# Patient Record
Sex: Male | Born: 1980
Health system: Southern US, Community
[De-identification: ages and names within clinical notes are randomized; demographics above are authoritative.]

## PROBLEM LIST (undated history)

## (undated) DIAGNOSIS — I10 Essential (primary) hypertension: Secondary | ICD-10-CM

## (undated) HISTORY — PX: DENTAL SURGERY: SHX609

## (undated) HISTORY — DX: Essential (primary) hypertension: I10

## (undated) HISTORY — PX: WISDOM TOOTH EXTRACTION: SHX21

---

## 2003-04-17 ENCOUNTER — Emergency Department (HOSPITAL_COMMUNITY): Admission: EM | Admit: 2003-04-17 | Discharge: 2003-04-17 | Payer: Self-pay | Admitting: Emergency Medicine

## 2006-06-17 ENCOUNTER — Emergency Department (HOSPITAL_COMMUNITY): Admission: EM | Admit: 2006-06-17 | Discharge: 2006-06-17 | Payer: Self-pay | Admitting: Emergency Medicine

## 2011-01-24 ENCOUNTER — Ambulatory Visit (INDEPENDENT_AMBULATORY_CARE_PROVIDER_SITE_OTHER): Payer: Commercial Managed Care - PPO

## 2011-01-24 DIAGNOSIS — I10 Essential (primary) hypertension: Secondary | ICD-10-CM

## 2011-01-24 DIAGNOSIS — M25519 Pain in unspecified shoulder: Secondary | ICD-10-CM

## 2011-04-30 ENCOUNTER — Other Ambulatory Visit: Payer: Self-pay | Admitting: Family Medicine

## 2011-04-30 MED ORDER — AMLODIPINE BESYLATE 5 MG PO TABS: 5.0000 mg | ORAL_TABLET | Freq: Every day | ORAL | Status: DC

## 2011-06-11 DIAGNOSIS — Z0271 Encounter for disability determination: Secondary | ICD-10-CM

## 2011-06-14 ENCOUNTER — Other Ambulatory Visit: Payer: Self-pay | Admitting: Family Medicine

## 2011-06-25 ENCOUNTER — Ambulatory Visit (INDEPENDENT_AMBULATORY_CARE_PROVIDER_SITE_OTHER): Payer: 59 | Admitting: Family Medicine

## 2011-06-25 VITALS — BP 146/99 | HR 66 | Temp 98.4°F | Resp 18 | Ht 71.0 in | Wt 193.0 lb

## 2011-06-25 DIAGNOSIS — I10 Essential (primary) hypertension: Secondary | ICD-10-CM

## 2011-06-25 DIAGNOSIS — L02219 Cutaneous abscess of trunk, unspecified: Secondary | ICD-10-CM

## 2011-06-25 DIAGNOSIS — L02214 Cutaneous abscess of groin: Secondary | ICD-10-CM

## 2011-06-25 DIAGNOSIS — L03319 Cellulitis of trunk, unspecified: Secondary | ICD-10-CM

## 2011-06-25 MED ORDER — AMLODIPINE BESYLATE 10 MG PO TABS: 10.0000 mg | ORAL_TABLET | Freq: Every day | ORAL | Status: DC

## 2011-06-25 MED ORDER — DOXYCYCLINE HYCLATE 100 MG PO TABS: 100.0000 mg | ORAL_TABLET | Freq: Two times a day (BID) | ORAL | Status: DC

## 2011-06-25 NOTE — Progress Notes (Signed)
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 He is when she's off.  He comes in today with 2 problems: Elevated blood pressure and boil in the groin. He needs a refill on his blood pressure medicine and he recognizes that he has not been eating right lately with increased salt in his diet. He is being compliant with his medication however. He's having no headaches, chest pain, shortness of breath.  Patient had a persistent boil in his left groin that drained yellow pus periodically. He has a new one on the right perineum as well. These are mildly tender.  Today he had a tooth extracted and plans on getting more dental work that he has insurance and force.  Objective: HEENT recent extraction of tooth, otherwise negative  Chest: Clear  Heart: Regular no murmur Aref  Groin: 1 cm left draining boil on the side of the scrotum, 1/2 cm scrotal early abscess on the right.  Assessment: Hypertension, uncontrolled; boils in groin  Plan: Increase amlodipine to 10 mg daily, recheck in one week  Doxycycline 100 twice a day x10 days

## 2011-07-02 ENCOUNTER — Ambulatory Visit (INDEPENDENT_AMBULATORY_CARE_PROVIDER_SITE_OTHER): Payer: 59 | Admitting: Family Medicine

## 2011-07-02 VITALS — BP 132/80 | HR 71 | Temp 98.3°F | Resp 16 | Ht 72.5 in | Wt 192.6 lb

## 2011-07-02 DIAGNOSIS — N4822 Cellulitis of corpus cavernosum and penis: Secondary | ICD-10-CM

## 2011-07-02 DIAGNOSIS — I10 Essential (primary) hypertension: Secondary | ICD-10-CM | POA: Insufficient documentation

## 2011-07-02 DIAGNOSIS — N4829 Other inflammatory disorders of penis: Secondary | ICD-10-CM

## 2011-07-02 NOTE — Patient Instructions (Signed)
Please return in 1 month   Hypertension As your heart beats, it forces blood through your arteries. This force is your blood pressure. If the pressure is too high, it is called hypertension (HTN) or high blood pressure. HTN is dangerous because you may have it and not know it. High blood pressure may mean that your heart has to work harder to pump blood. Your arteries may be narrow or stiff. The extra work puts you at risk for heart disease, stroke, and other problems.  Blood pressure consists of two numbers, a higher number over a lower, 110/72, for example. It is stated as "110 over 72." The ideal is below 120 for the top number (systolic) and under 80 for the bottom (diastolic). Write down your blood pressure today. You should pay close attention to your blood pressure if you have certain conditions such as:  Heart failure.   Prior heart attack.   Diabetes   Chronic kidney disease.   Prior stroke.   Multiple risk factors for heart disease.  To see if you have HTN, your blood pressure should be measured while you are seated with your arm held at the level of the heart. It should be measured at least twice. A one-time elevated blood pressure reading (especially in the Emergency Department) does not mean that you need treatment. There may be conditions in which the blood pressure is different between your right and left arms. It is important to see your caregiver soon for a recheck. Most people have essential hypertension which means that there is not a specific cause. This type of high blood pressure may be lowered by changing lifestyle factors such as:  Stress.   Smoking.   Lack of exercise.   Excessive weight.   Drug/tobacco/alcohol use.   Eating less salt.  Most people do not have symptoms from high blood pressure until it has caused damage to the body. Effective treatment can often prevent, delay or reduce that damage. TREATMENT  When a cause has been identified, treatment for  high blood pressure is directed at the cause. There are a large number of medications to treat HTN. These fall into several categories, and your caregiver will help you select the medicines that are best for you. Medications may have side effects. You should review side effects with your caregiver. If your blood pressure stays high after you have made lifestyle changes or started on medicines,   Your medication(s) may need to be changed.   Other problems may need to be addressed.   Be certain you understand your prescriptions, and know how and when to take your medicine.   Be sure to follow up with your caregiver within the time frame advised (usually within two weeks) to have your blood pressure rechecked and to review your medications.   If you are taking more than one medicine to lower your blood pressure, make sure you know how and at what times they should be taken. Taking two medicines at the same time can result in blood pressure that is too low.  SEEK IMMEDIATE MEDICAL CARE IF:  You develop a severe headache, blurred or changing vision, or confusion.   You have unusual weakness or numbness, or a faint feeling.   You have severe chest or abdominal pain, vomiting, or breathing problems.  MAKE SURE YOU:   Understand these instructions.   Will watch your condition.   Will get help right away if you are not doing well or get worse.  Document  Released: 01/27/2005 Document Revised: 01/16/2011 Document Reviewed: 09/17/2007 [facility] Patient Information 2012 [place], [place].

## 2011-07-02 NOTE — Progress Notes (Signed)
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  He comes in today with 2 problems: Elevated blood pressure and boil in the groin.  He is being compliant with his medication however. He's having no headaches, chest pain, shortness of breath. He feels a little bit more tired on the new medicines. Patient had a persistent boil in his left penis that drained yellow pus periodically. This is getting smaller and almost stopped drinking altogether.   Objective: HEENT recent extraction of tooth, otherwise negative  Blood pressure: 140/90 Chest: Clear  Heart: Regular no murmur Aref  Groin: 1 cm left draining boil on the side of the penis,  Assessment: Hypertension, uncontrolled; boils in groin  Plan:  amlodipine to 10 mg daily, recheck in one month Doxycycline 100 twice a day to be finished

## 2011-07-03 NOTE — Progress Notes (Signed)
Follow up appt scheduled with pt for June 27th at noon.

## 2011-07-07 ENCOUNTER — Ambulatory Visit (INDEPENDENT_AMBULATORY_CARE_PROVIDER_SITE_OTHER): Payer: 59 | Admitting: Family Medicine

## 2011-07-07 VITALS — BP 160/114 | HR 110 | Temp 98.0°F | Resp 16 | Ht 72.25 in | Wt 187.6 lb

## 2011-07-07 DIAGNOSIS — IMO0002 Reserved for concepts with insufficient information to code with codable children: Secondary | ICD-10-CM

## 2011-07-07 DIAGNOSIS — T07XXXA Unspecified multiple injuries, initial encounter: Secondary | ICD-10-CM

## 2011-07-07 DIAGNOSIS — Z23 Encounter for immunization: Secondary | ICD-10-CM

## 2011-07-07 MED ORDER — HYDROCODONE-ACETAMINOPHEN 5-500 MG PO TABS: 1.0000 | ORAL_TABLET | Freq: Three times a day (TID) | ORAL | Status: DC | PRN

## 2011-07-07 MED ORDER — SILVER SULFADIAZINE 1 % EX CREA: TOPICAL_CREAM | Freq: Every day | CUTANEOUS | Status: DC

## 2011-07-07 MED ORDER — TETANUS-DIPHTH-ACELL PERTUSSIS 5-2.5-18.5 LF-MCG/0.5 IM SUSP
0.5000 mL | Freq: Once | INTRAMUSCULAR | Status: AC
  Administered 2011-07-07: 0.5 mL via INTRAMUSCULAR
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
## 2011-07-12 ENCOUNTER — Ambulatory Visit (INDEPENDENT_AMBULATORY_CARE_PROVIDER_SITE_OTHER): Payer: 59 | Admitting: Family Medicine

## 2011-07-12 VITALS — BP 133/93 | HR 106 | Temp 99.3°F | Resp 16 | Ht 72.0 in | Wt 187.2 lb

## 2011-07-12 DIAGNOSIS — M25562 Pain in left knee: Secondary | ICD-10-CM

## 2011-07-12 DIAGNOSIS — M25569 Pain in unspecified knee: Secondary | ICD-10-CM

## 2011-07-12 DIAGNOSIS — T07XXXA Unspecified multiple injuries, initial encounter: Secondary | ICD-10-CM

## 2011-07-12 MED ORDER — HYDROCODONE-ACETAMINOPHEN 5-500 MG PO TABS: 1.0000 | ORAL_TABLET | Freq: Three times a day (TID) | ORAL | Status: DC | PRN

## 2011-07-12 NOTE — Patient Instructions (Addendum)
Continue the silvadene cream dressings.  Recheck Friday.
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
## 2011-07-18 ENCOUNTER — Ambulatory Visit (INDEPENDENT_AMBULATORY_CARE_PROVIDER_SITE_OTHER): Payer: 59 | Admitting: Family Medicine

## 2011-07-18 VITALS — BP 131/81 | HR 65 | Temp 98.7°F | Resp 18 | Ht 72.0 in | Wt 186.0 lb

## 2011-07-18 DIAGNOSIS — IMO0002 Reserved for concepts with insufficient information to code with codable children: Secondary | ICD-10-CM

## 2011-07-18 DIAGNOSIS — M25569 Pain in unspecified knee: Secondary | ICD-10-CM

## 2011-07-18 DIAGNOSIS — T07XXXA Unspecified multiple injuries, initial encounter: Secondary | ICD-10-CM

## 2011-07-18 DIAGNOSIS — M25562 Pain in left knee: Secondary | ICD-10-CM
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
## 2011-07-22 ENCOUNTER — Ambulatory Visit (INDEPENDENT_AMBULATORY_CARE_PROVIDER_SITE_OTHER): Payer: 59 | Admitting: Family Medicine

## 2011-07-22 VITALS — BP 129/81 | HR 66 | Temp 98.3°F | Resp 16 | Ht 72.0 in | Wt 187.6 lb

## 2011-07-22 DIAGNOSIS — M12569 Traumatic arthropathy, unspecified knee: Secondary | ICD-10-CM

## 2011-07-22 DIAGNOSIS — M25569 Pain in unspecified knee: Secondary | ICD-10-CM

## 2011-07-22 DIAGNOSIS — IMO0002 Reserved for concepts with insufficient information to code with codable children: Secondary | ICD-10-CM
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
## 2011-07-23 NOTE — Addendum Note (Signed)
Addended by: Thelma Barge D on: 07/23/2011 09:05 AM   Modules accepted: Orders

## 2011-07-28 ENCOUNTER — Ambulatory Visit
Admission: RE | Admit: 2011-07-28 | Discharge: 2011-07-28 | Disposition: A | Discharge status: Home / Self Care | Payer: Self-pay | Source: Ambulatory Visit | Attending: Family Medicine | Admitting: Family Medicine

## 2011-07-28 ENCOUNTER — Other Ambulatory Visit: Payer: Self-pay | Admitting: Family Medicine

## 2011-07-28 DIAGNOSIS — M12569 Traumatic arthropathy, unspecified knee: Secondary | ICD-10-CM

## 2011-07-28 DIAGNOSIS — S83249A Other tear of medial meniscus, current injury, unspecified knee, initial encounter: Secondary | ICD-10-CM

## 2011-07-28 DIAGNOSIS — M25569 Pain in unspecified knee: Secondary | ICD-10-CM

## 2011-07-29 ENCOUNTER — Telehealth: Payer: Self-pay

## 2011-07-29 NOTE — Telephone Encounter (Signed)
LOOKS LIKE PT'S MRI HAS NOT BEEN REVIEWED YET. CAN WE REVIEW THIS SO WE CAN LET THE PT KNOW?

## 2011-07-29 NOTE — Telephone Encounter (Signed)
Pt is wanting to get results from his mri -he has already got a call from [facility] who we referred him to and he is a little concerned because he doesn't know what is going on

## 2011-07-30 NOTE — Telephone Encounter (Signed)
Patient notified

## 2011-07-30 NOTE — Telephone Encounter (Signed)
LMOM to CB. 
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
## 2011-08-07 ENCOUNTER — Encounter: Payer: Self-pay | Admitting: Family Medicine

## 2011-08-07 ENCOUNTER — Ambulatory Visit (INDEPENDENT_AMBULATORY_CARE_PROVIDER_SITE_OTHER): Payer: 59 | Admitting: Family Medicine

## 2011-08-07 VITALS — BP 147/95 | HR 114 | Temp 98.3°F | Resp 16 | Ht 71.0 in | Wt 184.0 lb

## 2011-08-07 DIAGNOSIS — I10 Essential (primary) hypertension: Secondary | ICD-10-CM

## 2011-08-07 DIAGNOSIS — T07XXXA Unspecified multiple injuries, initial encounter: Secondary | ICD-10-CM

## 2011-08-07 DIAGNOSIS — M25569 Pain in unspecified knee: Secondary | ICD-10-CM

## 2011-08-07 DIAGNOSIS — S83239A Complex tear of medial meniscus, current injury, unspecified knee, initial encounter: Secondary | ICD-10-CM

## 2011-08-07 DIAGNOSIS — M25562 Pain in left knee: Secondary | ICD-10-CM

## 2011-08-07 DIAGNOSIS — L210 Seborrhea capitis: Secondary | ICD-10-CM

## 2011-08-07 MED ORDER — BETAMETHASONE DIPROPIONATE 0.05 % EX LOTN: TOPICAL_LOTION | Freq: Two times a day (BID) | CUTANEOUS | Status: DC
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
## 2011-08-07 NOTE — Patient Instructions (Addendum)
Abscess An abscess (boil or furuncle) is an infected area under your skin. This area is filled with yellowish white fluid (pus). HOME CARE   Only take medicine as told by your doctor.   Keep the skin clean around your abscess. Keep clothes that may touch the abscess clean.   Change any bandages (dressings) as told by your doctor.   Avoid direct skin contact with other people. The infection can spread by skin contact with others.   Practice good hygiene and do not share personal care items.   Do not share athletic equipment, towels, or whirlpools. Shower after every practice or work out session.   If a draining area cannot be covered:   Do not play sports.   Children should not go to daycare until the wound has healed or until fluid (drainage) stops coming out of the wound.   See your doctor for a follow-up visit as told.  GET HELP RIGHT AWAY IF:   There is more pain, puffiness (swelling), and redness in the wound site.   There is fluid or bleeding from the wound site.   You have muscle aches, chills, fever, or feel sick.   You or your child has a temperature by mouth above 102 F (38.9 C), not controlled by medicine.   Your baby is older than 3 months with a rectal temperature of 102 F (38.9 C) or higher.  MAKE SURE YOU:   Understand these instructions.   Will watch your condition.   Will get help right away if you are not doing well or get worse.  Document Released: 07/16/2007 Document Revised: 01/16/2011 Document Reviewed: 07/16/2007 [facility] Patient Information 2012 [place], [place].Seborrheic Dermatitis Seborrheic dermatitis involves pink or red skin with greasy, flaky scales. This is often found on the scalp, eyebrows, nose, bearded area, and on or behind the ears. It can also occur on the central chest. It often occurs where there are more oil (sebaceous) glands. This condition is also known as dandruff. When this condition affects a baby's scalp, it is called  cradle cap. It may come and go for no known reason. It can occur at any time of life from infancy to old age. CAUSES  The cause is unknown. It is not the result of too little moisture or too much oil. In some people, seborrheic dermatitis flare-ups seem to be triggered by stress. It also commonly occurs in people with certain diseases such as Parkinson's disease or HIV/AIDS. SYMPTOMS   Thick scales on the scalp.   Redness on the face or in the armpits.   The skin may seem oily or dry, but moisturizers do not help.   In infants, seborrheic dermatitis appears as scaly redness that does not seem to bother the baby. In some babies, it affects only the scalp. In others, it also affects the neck creases, armpits, groin, or behind the ears.   In adults and adolescents, seborrheic dermatitis may affect only the scalp. It may look patchy or spread out, with areas of redness and flaking. Other areas commonly affected include:   Eyebrows.   Eyelids.   Forehead.   Skin behind the ears.   Outer ears.   Chest.   Armpits.   Nose creases.   Skin creases under the breasts.   Skin between the buttocks.   Groin.   Some adults and adolescents feel itching or burning in the affected areas.  DIAGNOSIS  Your caregiver can usually tell what the problem is by doing a  physical exam. TREATMENT   Cortisone (steroid) ointments, creams, and lotions can help decrease inflammation.   Babies can be treated with baby oil to soften the scales, then they may be washed with baby shampoo. If this does not help, medicated shampoos should work.   Adults can also use medicated shampoos.   Your caregiver may prescribe corticosteroid cream and shampoo containing an antifungal or yeast medicine (ketoconazole). Hydrocortisone or anti-yeast cream can be rubbed directly onto seborrheic dermatitis patches. Yeast does not cause seborrheic dermatitis, but it seems to add to the problem.  In infants, seborrheic  dermatitis is often worst during the first year of life. It tends to disappear on its own as the child grows. However, it may return during the teenage years. In adults and adolescents, seborrheic dermatitis tends to be a long-lasting condition that comes and goes over many years. HOME CARE INSTRUCTIONS   Use prescribed medicines as directed.   In infants, do not aggressively remove the scales or flakes on the scalp with a comb or by other means. This may lead to hair loss.  SEEK MEDICAL CARE IF:   The problem does not improve from the medicated shampoos, lotions, or other medicines given by your caregiver.   You have any other questions or concerns.  Document Released: 01/27/2005 Document Revised: 01/16/2011 Document Reviewed: 06/18/2009 [facility] Patient Information 2012 [place], [place].

## 2011-11-27 ENCOUNTER — Encounter: Payer: Self-pay | Admitting: Family Medicine

## 2011-11-27 ENCOUNTER — Ambulatory Visit (INDEPENDENT_AMBULATORY_CARE_PROVIDER_SITE_OTHER): Payer: 59 | Admitting: Family Medicine

## 2011-11-27 VITALS — BP 132/100 | HR 68 | Temp 98.0°F | Resp 16 | Ht 71.0 in | Wt 189.0 lb

## 2011-11-27 DIAGNOSIS — L210 Seborrhea capitis: Secondary | ICD-10-CM

## 2011-11-27 DIAGNOSIS — L258 Unspecified contact dermatitis due to other agents: Secondary | ICD-10-CM

## 2011-11-27 DIAGNOSIS — I10 Essential (primary) hypertension: Secondary | ICD-10-CM

## 2011-11-27 MED ORDER — BETAMETHASONE DIPROPIONATE 0.05 % EX LOTN: TOPICAL_LOTION | Freq: Two times a day (BID) | CUTANEOUS | Status: DC

## 2011-11-27 MED ORDER — AMLODIPINE BESYLATE 10 MG PO TABS: 10.0000 mg | ORAL_TABLET | Freq: Every day | ORAL | Status: DC

## 2011-11-27 NOTE — Progress Notes (Signed)
@[email or web address] @  Patient ID: Eugene Gomez MRN: [id number], DOB: 1981/02/10, 31 y.o. Date of Encounter: 11/27/2011, 9:25 AM  Primary Physician: No primary provider on file.  Chief Complaint: HTN  HPI: 31 y.o. year old male with history below presents for hypertension follow up.  Diet consists of No CP, HA, visual changes, or focal deficits.   Past Medical History  Diagnosis Date  . Hypertension      Home Meds: Prior to Admission medications   Medication Sig Start Date End Date Taking? Authorizing Provider  amLODipine (NORVASC) 10 MG tablet Take 1 tablet (10 mg total) by mouth daily. 11/27/11 11/26/12 Yes Elvina Sidle, MD  betamethasone dipropionate 0.05 % lotion Apply topically 2 (two) times daily. 11/27/11 11/26/12 Yes Elvina Sidle, MD  silver sulfADIAZINE (SILVADENE) 1 % cream Apply topically daily. 07/07/11 07/06/12  Elvina Sidle, MD    Allergies:  Allergies  Allergen Reactions  . Aspirin Hives    History   Social History  . Marital Status: Married    Spouse Name: N/A    Number of Children: N/A  . Years of Education: N/A   Occupational History  . Not on file.   Social History Main Topics  . Smoking status: Current Some Day Smoker    Types: Cigars  . Smokeless tobacco: Not on file   Comment: MAYBE 5 OR 6 DAYS  . Alcohol Use: Yes     MIXED DRINKS  . Drug Use: Not on file  . Sexually Active: Not on file   Other Topics Concern  . Not on file   Social History Narrative  . No narrative on file     Family History  Problem Relation Age of Onset  . Hypertension Mother   . Anemia Mother   . Hypertension Father   . Diabetes Father   . Hypertension Brother   . Hypertension Daughter   . Hypertension Maternal Aunt   . Hypertension Maternal Uncle   . Hypertension Paternal Aunt   . Glaucoma Paternal Aunt   . Diabetes Paternal Aunt   . Hypertension Paternal Uncle   . Diabetes Paternal Uncle     Review of Systems: Constitutional: negative for chills,  fever, night sweats, weight changes, or fatigue  HEENT: negative for vision changes, hearing loss, congestion, rhinorrhea, ST, epistaxis, or sinus pressure Cardiovascular: negative for chest pain, palpitations, or DOE Respiratory: negative for hemoptysis, wheezing, shortness of breath, or cough Abdominal: negative for abdominal pain, nausea, vomiting, diarrhea, or constipation Dermatological: negative for rash Neurologic: negative for headache, dizziness, or syncope All other systems reviewed and are otherwise negative with the exception to those above and in the HPI.   Physical Exam:  128/88 recheck Blood pressure 132/100, pulse 68, temperature 98 F (36.7 C), temperature source Oral, resp. rate 16, height 5\' 11"  (1.803 m), weight 189 lb (85.73 kg), SpO2 100.00%., Body mass index is 26.36 kg/(m^2). General: Well developed, well nourished, in no acute distress. Head: Normocephalic, atraumatic, eyes without discharge, sclera non-icteric, nares are without discharge. Bilateral auditory canals clear, TM's are without perforation, pearly grey and translucent with reflective cone of light bilaterally. Oral cavity moist, posterior pharynx without exudate, erythema, peritonsillar abscess, or post nasal drip.  Neck: Supple. No thyromegaly. Full ROM. No lymphadenopathy. No carotid bruits. Lungs: Clear bilaterally to auscultation without wheezes, rales, or rhonchi. Breathing is unlabored. Heart: RRR with S1 S2. No murmurs, rubs, or gallops appreciated.  Abdomen: Soft, non-tender, non-distended with normoactive bowel sounds. No hepatosplenomegaly. No rebound/guarding. No obvious abdominal masses.  Msk:  Strength and tone normal for age. Extremities/Skin: Warm and dry. No clubbing or cyanosis. No edema. No rashes or suspicious lesions. Distal pulses 2+ and equal bilaterally. Neuro: Alert and oriented X 3. Moves all extremities spontaneously. Gait is normal. CNII-XII grossly in tact. DTR 2+, cerebellar  function intact. Rhomberg normal. Psych:  Responds to questions appropriately with a normal affect.   ASSESSMENT AND PLAN:  31 y.o. year old male with hypertension, more active now and watching salt.  Also has done well with the seborrhea 1. Seborrhea capitis  betamethasone dipropionate 0.05 % lotion  2. Hypertension  amLODipine (NORVASC) 10 MG tablet  recheck 6 months  Signed, Elvina Sidle, MD 11/27/2011 9:25 AM

## 2011-12-05 ENCOUNTER — Other Ambulatory Visit: Payer: Self-pay | Admitting: Family Medicine

## 2012-01-14 ENCOUNTER — Ambulatory Visit (INDEPENDENT_AMBULATORY_CARE_PROVIDER_SITE_OTHER): Payer: 59 | Admitting: Family Medicine

## 2012-01-14 VITALS — BP 150/100 | HR 85 | Temp 98.0°F | Resp 18 | Ht 72.0 in | Wt 200.0 lb

## 2012-01-14 DIAGNOSIS — L293 Anogenital pruritus, unspecified: Secondary | ICD-10-CM

## 2012-01-14 DIAGNOSIS — B356 Tinea cruris: Secondary | ICD-10-CM

## 2012-01-14 DIAGNOSIS — I10 Essential (primary) hypertension: Secondary | ICD-10-CM

## 2012-01-14 LAB — POCT UA - MICROSCOPIC ONLY
Mucus, UA: NEGATIVE
Yeast, UA: NEGATIVE

## 2012-01-14 LAB — POCT URINALYSIS DIPSTICK
Bilirubin, UA: NEGATIVE
Blood, UA: NEGATIVE
Glucose, UA: NEGATIVE
Ketones, UA: NEGATIVE
Leukocytes, UA: NEGATIVE
Nitrite, UA: NEGATIVE
Protein, UA: NEGATIVE
Spec Grav, UA: 1.02
Urobilinogen, UA: 0.2
pH, UA: 8.5

## 2012-01-14 MED ORDER — KETOCONAZOLE 2 % EX CREA: TOPICAL_CREAM | Freq: Two times a day (BID) | CUTANEOUS | Status: DC

## 2012-01-14 NOTE — Patient Instructions (Addendum)
1. Itching of penis  POCT urinalysis dipstick, POCT UA - Microscopic Only, GC/chlamydia probe amp, urine, Urine culture  2. Tinea cruris  ketoconazole (NIZORAL) 2 % cream  3. Essential hypertension, benign     Jock Itch Jock itch is a fungal infection of the skin in the groin area. It is sometimes called "ringworm" even though it is not caused by a worm. A fungus is a type of germ that thrives in dark, damp places.  CAUSES  This infection may spread from:  A fungus infection elsewhere on the body (such as athlete's foot).  Sharing towels or clothing. This infection is more common in:  Hot, humid climates.  People who wear tight-fitting clothing or wet bathing suits for long periods of time.  Athletes.  Overweight people.  People with diabetes. SYMPTOMS  Jock itch causes the following symptoms:  Red, pink or brown rash in the groin. Rash may spread to the thighs, anus, and buttocks.  Itching. DIAGNOSIS  Your caregiver may make the diagnosis by looking at the rash. Sometimes a skin scraping will be sent to test for fungus. Testing can be done either by looking under the microscope or by doing a culture (test to try to grow the fungus). A culture can take up to 2 weeks to come back. TREATMENT  Jock itch may be treated with:  Skin cream or ointment to kill fungus.  Medicine by mouth to kill fungus.  Skin cream or ointment to calm the itching.  Compresses or medicated powders to dry the infected skin. HOME CARE INSTRUCTIONS   Be sure to treat the rash completely. Follow your caregiver's instructions. It can take a couple of weeks to treat. If you do not treat the infection long enough, the rash can come back.  Wear loose-fitting clothing.  Men should wear cotton boxer shorts.  Women should wear cotton underwear.  Avoid hot baths.  Dry the groin area well after bathing. SEEK MEDICAL CARE IF:   Your rash is worse.  Your rash is spreading.  Your rash returns after  treatment is finished.  Your rash is not gone in 4 weeks. Fungal infections are slow to respond to treatment. Some redness may remain for several weeks after the fungus is gone. SEEK IMMEDIATE MEDICAL CARE IF:  The area becomes red, warm, tender, and swollen.  You have a fever. Document Released: 01/17/2002 Document Revised: 04/21/2011 Document Reviewed: 12/17/2007 [facility] Patient Information 2013 [place], [place].

## 2012-01-14 NOTE — Progress Notes (Signed)
[address]   [place], [place]  [address]   [phone]  Subjective:    Patient ID: Eugene Gomez, male    DOB: 06-Dec-1980, 31 y.o.   MRN: [id number]  HPIThis 31 y.o. male presents for evaluation of genital irritation.   Onset three weeks ago.  Thinks is jock itch.  Applied cream on area which burns/hurts.  Applying hydrocortisone cream with relief.  Also getting irritation on genital area intermittent.  No dysuria; no penile discharge.  No new sexual partners; +sexually active with wife.  No similar symptoms; wife without symptoms.  Rash B inner thighs itches with sweating.  History of recurrent boils with improvement after two rounds of Doxycycline per Lauenstein.  2.  HTN:  Home readings 120/90s.  Strong family history of HTN, DMII.  Denies HA, dizziness, blurred vision, focal weakness, chest pain,palpitations, shortness of breath.  Gets nervous at doctor's office; multiple painful procedures for boils in the past.   Review of Systems  Constitutional: Negative for fever, chills, diaphoresis and fatigue.  Respiratory: Negative for shortness of breath.   Cardiovascular: Negative for chest pain, palpitations and leg swelling.  Skin: Positive for color change and rash. Negative for wound.  Neurological: Negative for dizziness, tremors, syncope, speech difficulty, weakness, light-headedness, numbness and headaches.        Past Medical History  Diagnosis Date  . Hypertension     No past surgical history on file.  Prior to Admission medications   Medication Sig Start Date End Date Taking? Authorizing Provider  amLODipine (NORVASC) 10 MG tablet Take 1 tablet (10 mg total) by mouth daily. 11/27/11 11/26/12 Yes Elvina Sidle, MD  betamethasone dipropionate 0.05 % lotion Apply topically 2 (two) times daily. 11/27/11 11/26/12 Yes Elvina Sidle, MD  silver sulfADIAZINE (SILVADENE) 1 % cream Apply topically daily. 07/07/11 07/06/12  Elvina Sidle, MD    Allergies  Allergen Reactions    . Aspirin Hives    History   Social History  . Marital Status: Married    Spouse Name: N/A    Number of Children: N/A  . Years of Education: N/A   Occupational History  . Not on file.   Social History Main Topics  . Smoking status: Current Some Day Smoker    Types: Cigars  . Smokeless tobacco: Not on file     Comment: MAYBE 5 OR 6 DAYS  . Alcohol Use: Yes     Comment: MIXED DRINKS  . Drug Use: Not on file  . Sexually Active: Not on file   Other Topics Concern  . Not on file   Social History Narrative  . No narrative on file    Family History  Problem Relation Age of Onset  . Hypertension Mother   . Anemia Mother   . Hypertension Father   . Diabetes Father   . Hypertension Brother   . Hypertension Daughter   . Hypertension Maternal Aunt   . Hypertension Maternal Uncle   . Hypertension Paternal Aunt   . Glaucoma Paternal Aunt   . Diabetes Paternal Aunt   . Hypertension Paternal Uncle   . Diabetes Paternal Uncle     Objective:   Physical Exam  Constitutional: He appears well-developed and well-nourished. No distress.  Eyes: Conjunctivae normal are normal. Pupils are equal, round, and reactive to light.  Neck: Normal range of motion. Neck supple. No thyromegaly present.  Cardiovascular: Normal rate, regular rhythm, normal heart sounds and intact distal pulses.  Exam reveals no gallop.  No murmur heard. Pulmonary/Chest: Effort normal and breath sounds normal. He has no wheezes. He has no rales.  Genitourinary: Testes normal and penis normal. Right testis shows no mass, no swelling and no tenderness. Left testis shows no mass, no swelling and no tenderness. Circumcised. No penile erythema or penile tenderness. No discharge found.  Lymphadenopathy:    He has no cervical adenopathy.  Skin: Rash noted. He is not diaphoretic. There is erythema.       GENITAL REGION:  SCALING ERYTHEMATOUS RASH B INNER THIGHS; WELL DEFINED BORDER WITH MILD SCALING.  SCATTERED 2-3  SMALL 8 MM MACULOPAPULAR LESIONS WITH PUSTULE CENTRALLY WITHOUT FLUCTUANTS.   Results for orders placed in visit on 01/14/12  POCT URINALYSIS DIPSTICK      Component Value Range   Color, UA yellow     Clarity, UA clear     Glucose, UA neg     Bilirubin, UA neg     Ketones, UA neg     Spec Grav, UA 1.020     Blood, UA neg     pH, UA 8.5     Protein, UA neg     Urobilinogen, UA 0.2     Nitrite, UA neg     Leukocytes, UA Negative    POCT UA - MICROSCOPIC ONLY      Component Value Range   WBC, Ur, HPF, POC 1-2     RBC, urine, microscopic 0-2     Bacteria, U Microscopic trace     Mucus, UA neg     Epithelial cells, urine per micros 1-2     Crystals, Ur, HPF, POC neg     Casts, Ur, LPF, POC neg     Yeast, UA neg         Assessment & Plan:   1. Itching of penis  POCT urinalysis dipstick, POCT UA - Microscopic Only  2. Tinea cruris    3. Essential hypertension, benign      1. Tinea Cruris: New.  Rx for ketoconazole cream bid for two weeks.  To call if no improvement. 2.  Penile discomfort:  New.  Send urine culture and uriprobe.  RTC for worsening.  No associated dysuria, penile discharge.  Pt worried about underlying STD. 3. HTN: uncontrolled but pt thinks suffers with white coat syndrome. Advised to check BP twice weekly.  RTC for BP>140/90 for further medication.

## 2012-01-16 LAB — URINE CULTURE
Colony Count: NO GROWTH
Organism ID, Bacteria: NO GROWTH

## 2012-01-17 NOTE — Progress Notes (Signed)
Reviewed and agree.

## 2012-03-27 ENCOUNTER — Other Ambulatory Visit: Payer: Self-pay

## 2012-05-27 ENCOUNTER — Ambulatory Visit: Payer: 59 | Admitting: Family Medicine

## 2012-06-24 ENCOUNTER — Ambulatory Visit (INDEPENDENT_AMBULATORY_CARE_PROVIDER_SITE_OTHER): Payer: 59 | Admitting: Family Medicine

## 2012-06-24 ENCOUNTER — Encounter: Payer: Self-pay | Admitting: Family Medicine

## 2012-06-24 VITALS — BP 132/82 | HR 97 | Temp 98.2°F | Resp 16 | Ht 71.0 in | Wt 198.4 lb

## 2012-06-24 DIAGNOSIS — L02214 Cutaneous abscess of groin: Secondary | ICD-10-CM

## 2012-06-24 DIAGNOSIS — L210 Seborrhea capitis: Secondary | ICD-10-CM

## 2012-06-24 DIAGNOSIS — R21 Rash and other nonspecific skin eruption: Secondary | ICD-10-CM

## 2012-06-24 DIAGNOSIS — I10 Essential (primary) hypertension: Secondary | ICD-10-CM

## 2012-06-24 MED ORDER — DOXYCYCLINE HYCLATE 100 MG PO TABS: 100.0000 mg | ORAL_TABLET | Freq: Two times a day (BID) | ORAL | Status: DC

## 2012-06-24 MED ORDER — AMLODIPINE BESYLATE 10 MG PO TABS: 10.0000 mg | ORAL_TABLET | Freq: Every day | ORAL | Status: DC

## 2012-06-24 MED ORDER — BETAMETHASONE DIPROPIONATE 0.05 % EX LOTN: TOPICAL_LOTION | Freq: Two times a day (BID) | CUTANEOUS | Status: DC

## 2012-06-24 NOTE — Progress Notes (Signed)
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 He's had recurrent problems with groin abscesses and infections this treated with doxycycline in the past. These have occurred in the recent week and he wants a refill on his medicine.  Patient also is here because of blood pressure elevation in the past. He's having no problems with the amlodipine at present and his blood pressures have been normal when he checked them.  Objective: No acute distress, appropriate with good eye contact Groin: Multiple small pustules right side Chest: Clear Heart: Regular no murmur Neck: Supple no adenopathy or thyromegaly  Assessment: Hypertension controlled, recurrent groin staph infection  Plan: Abscess of groin, left - Plan: doxycycline (VIBRA-TABS) 100 MG tablet  Hypertension - Plan: amLODipine (NORVASC) 10 MG tablet  Seborrhea capitis - Plan: betamethasone dipropionate 0.05 % lotion  Signed, Elvina Sidle, MD

## 2012-07-09 ENCOUNTER — Other Ambulatory Visit: Payer: Self-pay | Admitting: Family Medicine

## 2012-12-16 ENCOUNTER — Other Ambulatory Visit: Payer: Self-pay

## 2012-12-30 ENCOUNTER — Ambulatory Visit (INDEPENDENT_AMBULATORY_CARE_PROVIDER_SITE_OTHER): Payer: 59 | Admitting: Family Medicine

## 2012-12-30 ENCOUNTER — Encounter: Payer: Self-pay | Admitting: Family Medicine

## 2012-12-30 VITALS — BP 143/89 | HR 77 | Temp 98.8°F | Resp 16 | Ht 71.0 in | Wt 208.6 lb

## 2012-12-30 DIAGNOSIS — I10 Essential (primary) hypertension: Secondary | ICD-10-CM

## 2012-12-30 DIAGNOSIS — L210 Seborrhea capitis: Secondary | ICD-10-CM

## 2012-12-30 MED ORDER — BETAMETHASONE DIPROPIONATE 0.05 % EX LOTN: TOPICAL_LOTION | Freq: Two times a day (BID) | CUTANEOUS | Status: DC

## 2012-12-30 MED ORDER — AMLODIPINE BESYLATE 10 MG PO TABS: 10.0000 mg | ORAL_TABLET | Freq: Every day | ORAL | Status: DC

## 2012-12-30 NOTE — Progress Notes (Signed)
32 yo [address], married, two girls in sports, recently joined [employer] to lose weight.  No new complaints:  Having some left shoulder discomfort (left handed), left knee soreness (chronic).    Objective: NAD Chest:  Clear Heart:  Reg, no murmur Abdomen:  Soft, nontender  Assessment:  Stable  Hypertension - Plan: amLODipine (NORVASC) 10 MG tablet  Signed, Elvina Sidle, MD

## 2013-02-25 DIAGNOSIS — Z0271 Encounter for disability determination: Secondary | ICD-10-CM

## 2013-06-18 ENCOUNTER — Ambulatory Visit (INDEPENDENT_AMBULATORY_CARE_PROVIDER_SITE_OTHER): Payer: 59 | Admitting: Family Medicine

## 2013-06-18 VITALS — BP 124/76 | HR 85 | Temp 98.3°F | Resp 17 | Ht 71.0 in | Wt 215.0 lb

## 2013-06-18 DIAGNOSIS — H109 Unspecified conjunctivitis: Secondary | ICD-10-CM

## 2013-06-18 DIAGNOSIS — H02849 Edema of unspecified eye, unspecified eyelid: Secondary | ICD-10-CM

## 2013-06-18 MED ORDER — TOBRAMYCIN-DEXAMETHASONE 0.3-0.1 % OP SUSP: 2.0000 [drp] | Freq: Four times a day (QID) | OPHTHALMIC | Status: DC

## 2013-06-18 NOTE — Patient Instructions (Signed)

## 2013-06-18 NOTE — Progress Notes (Signed)
Chief Complaint:  Chief Complaint  Patient presents with  . Conjunctivitis  . Facial Swelling    HPI: Eugene Gomez is a 33 y.o. male who is here for Thursday night and Friday morning He was at his daughter's soccer game  When he felt hsi eyes start itching and getting red He has no pain HE has light sensitivity and adjusts after a second or two He wears contacts He changes regular and washes , he used to take it out every 2-3 days and changes them every 2-3 weeks.    Past Medical History  Diagnosis Date  . Hypertension    No past surgical history on file. History   Social History  . Marital Status: Married    Spouse Name: N/A    Number of Children: N/A  . Years of Education: N/A   Social History Main Topics  . Smoking status: Current Some Day Smoker    Types: Cigars  . Smokeless tobacco: None     Comment: MAYBE 5 OR 6 DAYS  . Alcohol Use: Yes     Comment: MIXED DRINKS  . Drug Use: No  . Sexual Activity: Yes   Other Topics Concern  . None   Social History Narrative  . None   Family History  Problem Relation Age of Onset  . Hypertension Mother   . Anemia Mother   . Hypertension Father   . Diabetes Father   . Hypertension Brother   . Hypertension Daughter   . Hypertension Maternal Aunt   . Hypertension Maternal Uncle   . Hypertension Paternal Aunt   . Glaucoma Paternal Aunt   . Diabetes Paternal Aunt   . Hypertension Paternal Uncle   . Diabetes Paternal Uncle    Allergies  Allergen Reactions  . Aspirin Hives   Prior to Admission medications   Medication Sig Start Date End Date Taking? Authorizing Provider  amLODipine (NORVASC) 10 MG tablet Take 1 tablet (10 mg total) by mouth daily. 12/30/12  Yes Elvina SidleKurt Lauenstein, MD  betamethasone dipropionate 0.05 % lotion Apply topically 2 (two) times daily. 12/30/12 12/30/13 Yes Elvina SidleKurt Lauenstein, MD     ROS: The patient denies fevers, chills, night sweats, unintentional weight loss, chest pain,  palpitations, wheezing, dyspnea on exertion, nausea, vomiting, abdominal pain, dysuria, hematuria, melena, numbness, weakness, or tingling.   All other systems have been reviewed and were otherwise negative with the exception of those mentioned in the HPI and as above.    PHYSICAL EXAM: Filed Vitals:   06/18/13 0814  BP: 124/76  Pulse: 85  Temp: 98.3 F (36.8 C)  Resp: 17   Filed Vitals:   06/18/13 0814  Height: 5\' 11"  (1.803 m)  Weight: 215 lb (97.523 kg)   Body mass index is 30 kg/(m^2).  General: Alert, no acute distress HEENT:  Normocephalic, atraumatic, oropharynx patent. EOMI, PERRLA, fundo exam nl, neg fluoroscein uptake. No abrasions Cardiovascular:  Regular rate and rhythm, no rubs murmurs or gallops.  No Carotid bruits, radial pulse intact. No pedal edema.  Respiratory: Clear to auscultation bilaterally.  No wheezes, rales, or rhonchi.  No cyanosis, no use of accessory musculature GI: No organomegaly, abdomen is soft and non-tender, positive bowel sounds.  No masses. Skin: No rashes. Neurologic: Facial musculature symmetric. Psychiatric: Patient is appropriate throughout our interaction. Lymphatic: No cervical lymphadenopathy Musculoskeletal: Gait intact.   LABS: Results for orders placed in visit on 01/14/12  URINE CULTURE      Result Value Ref Range  Colony Count NO GROWTH     Organism ID, Bacteria NO GROWTH    GC/CHLAMYDIA PROBE AMP, URINE      Result Value Ref Range   Chlamydia, Swab/Urine, PCR NEGATIVE  NEGATIVE   GC Probe Amp, Urine NEGATIVE  NEGATIVE  POCT URINALYSIS DIPSTICK      Result Value Ref Range   Color, UA yellow     Clarity, UA clear     Glucose, UA neg     Bilirubin, UA neg     Ketones, UA neg     Spec Grav, UA 1.020     Blood, UA neg     pH, UA 8.5     Protein, UA neg     Urobilinogen, UA 0.2     Nitrite, UA neg     Leukocytes, UA Negative    POCT UA - MICROSCOPIC ONLY      Result Value Ref Range   WBC, Ur, HPF, POC 1-2     RBC,  urine, microscopic 0-2     Bacteria, U Microscopic trace     Mucus, UA neg     Epithelial cells, urine per micros 1-2     Crystals, Ur, HPF, POC neg     Casts, Ur, LPF, POC neg     Yeast, UA neg       EKG/XRAY:   Primary read interpreted by Dr[facility] Conley RollsLe at [facility].   ASSESSMENT/PLAN: Encounter Diagnoses  Name Primary?  . Conjunctivitis Yes  . Swelling of eyelid    Fluroscein negative uptake Rx todbredx, no glaucoma Do not wear contacts F/u if worsening sxs Cont with antihistamine eye drops  Gross sideeffects, risk and benefits, and alternatives of medications d/w patient. Patient is aware that all medications have potential sideeffects and we are unable to predict every sideeffect or drug-drug interaction that may occur.  Jahliyah Trice P Denishia Citro, DO 06/18/2013 10:02 AM

## 2013-06-28 ENCOUNTER — Other Ambulatory Visit: Payer: Self-pay | Admitting: Family Medicine

## 2013-06-30 ENCOUNTER — Encounter: Payer: Self-pay | Admitting: Family Medicine

## 2013-06-30 ENCOUNTER — Ambulatory Visit (INDEPENDENT_AMBULATORY_CARE_PROVIDER_SITE_OTHER): Payer: 59 | Admitting: Family Medicine

## 2013-06-30 VITALS — BP 140/84 | HR 68 | Resp 18 | Ht 71.5 in | Wt 214.0 lb

## 2013-06-30 DIAGNOSIS — B360 Pityriasis versicolor: Secondary | ICD-10-CM

## 2013-06-30 DIAGNOSIS — I10 Essential (primary) hypertension: Secondary | ICD-10-CM

## 2013-06-30 DIAGNOSIS — L210 Seborrhea capitis: Secondary | ICD-10-CM

## 2013-06-30 MED ORDER — KETOCONAZOLE 200 MG PO TABS: 200.0000 mg | ORAL_TABLET | Freq: Every day | ORAL | Status: DC

## 2013-06-30 MED ORDER — BETAMETHASONE DIPROPIONATE 0.05 % EX LOTN: TOPICAL_LOTION | Freq: Two times a day (BID) | CUTANEOUS | Status: DC

## 2013-06-30 MED ORDER — KETOCONAZOLE 2 % EX SHAM: 1.0000 "application " | MEDICATED_SHAMPOO | CUTANEOUS | Status: DC

## 2013-06-30 MED ORDER — AMLODIPINE BESYLATE 10 MG PO TABS: 10.0000 mg | ORAL_TABLET | Freq: Every day | ORAL | Status: DC

## 2013-06-30 NOTE — Progress Notes (Signed)
33 [place] married, two girls in sports, last year joined [employer] to lose weight but he hasn't had time to work out.  He rec'd promotion at work which means a more sedentary day.     New complaints:  Dry scalp for several months and new neck rash over past month.  Recently dentist rescheduled cleaning for teeth because blood pressure was elevated.  Family History   Problem  Relation  Age of Onset   .  Hypertension  Mother    .  Anemia  Mother    .  Hypertension  Father    .  Diabetes  Father    .  Hypertension  Brother    .  Hypertension  Daughter    .  Hypertension  Maternal Aunt    .  Hypertension  Maternal Uncle    .  Hypertension  Paternal Aunt    .  Glaucoma  Paternal Aunt    .  Diabetes  Paternal Aunt    .  Hypertension  Paternal Uncle    .  Diabetes  Paternal Uncle     Social History Main Topics  .  Smoking status:  Current Some Day Smoker  Types:  Cigars  .  Smokeless tobacco:  None  Comment: MAYBE 5 OR 6 DAYS  .  Alcohol Use:  Yes  Comment: MIXED DRINKS  .  Drug Use:  No  .  Sexual Activity:  Yes   Allergies   Allergen  Reactions   .  Aspirin  Hives    Prior to Admission medications   Medication  Sig  Start Date  End Date  Taking?  Authorizing Provider   amLODipine (NORVASC) 10 MG tablet  Take 1 tablet (10 mg total) by mouth daily.  12/30/12   Yes  Elvina SidleKurt Arrionna Serena, MD   betamethasone dipropionate 0.05 % lotion  Apply topically 2 (two) times daily.         Objective:  NAD HEENT:  Poor dentitiion Chest:  Clear Heart:  Reg, no murmur Skin:  Oval plaques along neck line Scalp:  Dry  Assessment:  Needs dental work.  BP acceptable.   Seborrhea capitis - Plan: betamethasone dipropionate 0.05 % lotion, ketoconazole (NIZORAL) 2 % shampoo  Hypertension - Plan: amLODipine (NORVASC) 10 MG tablet  Tinea versicolor - Plan: ketoconazole (NIZORAL) 200 MG tablet  Signed, Elvina SidleKurt Ridhi Hoffert, MD

## 2013-06-30 NOTE — Patient Instructions (Signed)
We have checked your blood pressure twice here in the office and the pressure is acceptable.  Your blood pressure may be going up before dental work because of some anxiety.  I think having dental work with your blood pressure readings is safe and important.

## 2013-11-25 ENCOUNTER — Other Ambulatory Visit: Payer: Self-pay

## 2013-12-15 ENCOUNTER — Ambulatory Visit (INDEPENDENT_AMBULATORY_CARE_PROVIDER_SITE_OTHER): Payer: 59 | Admitting: Family Medicine

## 2013-12-15 VITALS — BP 142/96 | HR 90 | Temp 98.5°F | Resp 16 | Ht 72.25 in | Wt 214.0 lb

## 2013-12-15 DIAGNOSIS — M545 Low back pain: Secondary | ICD-10-CM

## 2013-12-15 DIAGNOSIS — I10 Essential (primary) hypertension: Secondary | ICD-10-CM

## 2013-12-15 MED ORDER — CYCLOBENZAPRINE HCL 5 MG PO TABS
ORAL_TABLET | ORAL | Status: DC
Start: 2013-12-15 — End: 2013-12-26
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
## 2013-12-26 ENCOUNTER — Ambulatory Visit (INDEPENDENT_AMBULATORY_CARE_PROVIDER_SITE_OTHER): Payer: 59 | Admitting: Family Medicine

## 2013-12-26 ENCOUNTER — Encounter: Payer: Self-pay | Admitting: Family Medicine

## 2013-12-26 ENCOUNTER — Ambulatory Visit (INDEPENDENT_AMBULATORY_CARE_PROVIDER_SITE_OTHER): Payer: 59

## 2013-12-26 VITALS — BP 140/90 | Temp 97.8°F | Resp 16 | Ht 71.5 in | Wt 209.0 lb

## 2013-12-26 DIAGNOSIS — M545 Low back pain: Secondary | ICD-10-CM

## 2013-12-26 DIAGNOSIS — L219 Seborrheic dermatitis, unspecified: Secondary | ICD-10-CM

## 2013-12-26 DIAGNOSIS — I10 Essential (primary) hypertension: Secondary | ICD-10-CM

## 2013-12-26 LAB — POCT URINALYSIS DIPSTICK
Bilirubin, UA: NEGATIVE
Blood, UA: NEGATIVE
Glucose, UA: NEGATIVE
Ketones, UA: NEGATIVE
Leukocytes, UA: NEGATIVE
Nitrite, UA: NEGATIVE
Protein, UA: NEGATIVE
Spec Grav, UA: 1.015
Urobilinogen, UA: 0.2
pH, UA: 8.5

## 2013-12-26 MED ORDER — CYCLOBENZAPRINE HCL 5 MG PO TABS: ORAL_TABLET | ORAL | Status: DC

## 2013-12-26 MED ORDER — AMLODIPINE BESYLATE 10 MG PO TABS: 10.0000 mg | ORAL_TABLET | Freq: Every day | ORAL | Status: DC

## 2013-12-26 MED ORDER — TRIAMCINOLONE ACETONIDE 0.1 % EX CREA: 1.0000 "application " | TOPICAL_CREAM | Freq: Two times a day (BID) | CUTANEOUS | Status: DC
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
## 2013-12-26 NOTE — Patient Instructions (Signed)
Seborrheic Dermatitis °Seborrheic dermatitis involves pink or red skin with greasy, flaky scales. This is often found on the scalp, eyebrows, nose, bearded area, and on or behind the ears. It can also occur on the central chest. It often occurs where there are more oil (sebaceous) glands. This condition is also known as dandruff. When this condition affects a baby's scalp, it is called cradle cap. It may come and go for no known reason. It can occur at any time of life from infancy to old age. °CAUSES  °The cause is unknown. It is not the result of too little moisture or too much oil. In some people, seborrheic dermatitis flare-ups seem to be triggered by stress. It also commonly occurs in people with certain diseases such as Parkinson's disease or HIV/AIDS. °SYMPTOMS  °· Thick scales on the scalp. °· Redness on the face or in the armpits. °· The skin may seem oily or dry, but moisturizers do not help. °· In infants, seborrheic dermatitis appears as scaly redness that does not seem to bother the baby. In some babies, it affects only the scalp. In others, it also affects the neck creases, armpits, groin, or behind the ears. °· In adults and adolescents, seborrheic dermatitis may affect only the scalp. It may look patchy or spread out, with areas of redness and flaking. Other areas commonly affected include: °¨ Eyebrows. °¨ Eyelids. °¨ Forehead. °¨ Skin behind the ears. °¨ Outer ears. °¨ Chest. °¨ Armpits. °¨ Nose creases. °¨ Skin creases under the breasts. °¨ Skin between the buttocks. °¨ Groin. °· Some adults and adolescents feel itching or burning in the affected areas. °DIAGNOSIS  °Your caregiver can usually tell what the problem is by doing a physical exam. °TREATMENT  °· Cortisone (steroid) ointments, creams, and lotions can help decrease inflammation. °· Babies can be treated with baby oil to soften the scales, then they may be washed with baby shampoo. If this does not help, a prescription topical steroid  medicine may work. °· Adults can use medicated shampoos. °· Your caregiver may prescribe corticosteroid cream and shampoo containing an antifungal or yeast medicine (ketoconazole). Hydrocortisone or anti-yeast cream can be rubbed directly onto seborrheic dermatitis patches. Yeast does not cause seborrheic dermatitis, but it seems to add to the problem. °In infants, seborrheic dermatitis is often worst during the first year of life. It tends to disappear on its own as the child grows. However, it may return during the teenage years. In adults and adolescents, seborrheic dermatitis tends to be a long-lasting condition that comes and goes over many years. °HOME CARE INSTRUCTIONS  °· Use prescribed medicines as directed. °· In infants, do not aggressively remove the scales or flakes on the scalp with a comb or by other means. This may lead to hair loss. °SEEK MEDICAL CARE IF:  °· The problem does not improve from the medicated shampoos, lotions, or other medicines given by your caregiver. °· You have any other questions or concerns. °Document Released: 01/27/2005 Document Revised: 07/29/2011 Document Reviewed: 06/18/2009 °ExitCare® Patient Information ©2015 [employer]. This information is not intended to replace advice given to you by your health care provider. Make sure you discuss any questions you have with your health care provider. ° °

## 2014-01-26 ENCOUNTER — Ambulatory Visit (INDEPENDENT_AMBULATORY_CARE_PROVIDER_SITE_OTHER): Payer: 59 | Admitting: Family Medicine

## 2014-01-26 ENCOUNTER — Encounter: Payer: Self-pay | Admitting: Family Medicine

## 2014-01-26 VITALS — BP 148/92 | HR 87 | Temp 98.9°F | Resp 16 | Ht 72.75 in | Wt 214.0 lb

## 2014-01-26 DIAGNOSIS — L219 Seborrheic dermatitis, unspecified: Secondary | ICD-10-CM

## 2014-01-26 DIAGNOSIS — M25512 Pain in left shoulder: Secondary | ICD-10-CM

## 2014-01-26 DIAGNOSIS — I10 Essential (primary) hypertension: Secondary | ICD-10-CM

## 2014-01-26 DIAGNOSIS — M545 Low back pain: Secondary | ICD-10-CM

## 2014-01-26 DIAGNOSIS — L210 Seborrhea capitis: Secondary | ICD-10-CM

## 2014-01-26 MED ORDER — KETOCONAZOLE 2 % EX SHAM: 1.0000 "application " | MEDICATED_SHAMPOO | CUTANEOUS | Status: DC

## 2014-01-26 MED ORDER — TRIAMCINOLONE ACETONIDE 0.1 % EX CREA: 1.0000 "application " | TOPICAL_CREAM | Freq: Two times a day (BID) | CUTANEOUS | Status: DC

## 2014-01-26 MED ORDER — AMLODIPINE BESYLATE 10 MG PO TABS: 10.0000 mg | ORAL_TABLET | Freq: Every day | ORAL | Status: DC

## 2014-01-26 MED ORDER — BETAMETHASONE DIPROPIONATE 0.05 % EX LOTN: TOPICAL_LOTION | Freq: Two times a day (BID) | CUTANEOUS | Status: DC

## 2014-01-26 MED ORDER — PREDNISONE 20 MG PO TABS: ORAL_TABLET | ORAL | Status: DC

## 2014-01-26 MED ORDER — CYCLOBENZAPRINE HCL 5 MG PO TABS: ORAL_TABLET | ORAL | Status: DC
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
## 2014-08-03 ENCOUNTER — Ambulatory Visit: Payer: 59 | Admitting: Family Medicine

## 2014-08-03 ENCOUNTER — Ambulatory Visit: Payer: Self-pay | Admitting: Urgent Care

## 2014-08-24 ENCOUNTER — Encounter: Payer: Self-pay | Admitting: Physician Assistant

## 2014-08-24 ENCOUNTER — Ambulatory Visit (INDEPENDENT_AMBULATORY_CARE_PROVIDER_SITE_OTHER): Payer: Commercial Managed Care - HMO | Admitting: Physician Assistant

## 2014-08-24 VITALS — BP 144/100 | HR 95 | Temp 98.6°F | Resp 16 | Wt 219.0 lb

## 2014-08-24 DIAGNOSIS — R059 Cough, unspecified: Secondary | ICD-10-CM

## 2014-08-24 DIAGNOSIS — R61 Generalized hyperhidrosis: Secondary | ICD-10-CM | POA: Insufficient documentation

## 2014-08-24 DIAGNOSIS — L210 Seborrhea capitis: Secondary | ICD-10-CM | POA: Insufficient documentation

## 2014-08-24 DIAGNOSIS — Z114 Encounter for screening for human immunodeficiency virus [HIV]: Secondary | ICD-10-CM

## 2014-08-24 DIAGNOSIS — I10 Essential (primary) hypertension: Secondary | ICD-10-CM | POA: Diagnosis not present

## 2014-08-24 DIAGNOSIS — R51 Headache: Secondary | ICD-10-CM | POA: Diagnosis not present

## 2014-08-24 DIAGNOSIS — M25512 Pain in left shoulder: Secondary | ICD-10-CM | POA: Insufficient documentation

## 2014-08-24 DIAGNOSIS — R519 Headache, unspecified: Secondary | ICD-10-CM

## 2014-08-24 DIAGNOSIS — L219 Seborrheic dermatitis, unspecified: Secondary | ICD-10-CM

## 2014-08-24 DIAGNOSIS — R05 Cough: Secondary | ICD-10-CM | POA: Diagnosis not present

## 2014-08-24 DIAGNOSIS — L739 Follicular disorder, unspecified: Secondary | ICD-10-CM | POA: Insufficient documentation

## 2014-08-24 DIAGNOSIS — S83207A Unspecified tear of unspecified meniscus, current injury, left knee, initial encounter: Secondary | ICD-10-CM | POA: Insufficient documentation

## 2014-08-24 LAB — COMPREHENSIVE METABOLIC PANEL
ALT: 46 U/L (ref 0–53)
AST: 29 U/L (ref 0–37)
Albumin: 4.3 g/dL (ref 3.5–5.2)
Alkaline Phosphatase: 109 U/L (ref 39–117)
BILIRUBIN TOTAL: 0.4 mg/dL (ref 0.2–1.2)
BUN: 9 mg/dL (ref 6–23)
CALCIUM: 10 mg/dL (ref 8.4–10.5)
CO2: 27 mEq/L (ref 19–32)
Chloride: 103 mEq/L (ref 96–112)
Creat: 1.08 mg/dL (ref 0.50–1.35)
GLUCOSE: 84 mg/dL (ref 70–99)
POTASSIUM: 4.2 meq/L (ref 3.5–5.3)
SODIUM: 142 meq/L (ref 135–145)
TOTAL PROTEIN: 7.7 g/dL (ref 6.0–8.3)

## 2014-08-24 LAB — CBC WITH DIFFERENTIAL/PLATELET
BASOS ABS: 0 10*3/uL (ref 0.0–0.1)
Basophils Relative: 0 % (ref 0–1)
Eosinophils Absolute: 0.1 10*3/uL (ref 0.0–0.7)
Eosinophils Relative: 2 % (ref 0–5)
HCT: 42.4 % (ref 39.0–52.0)
HEMOGLOBIN: 14.6 g/dL (ref 13.0–17.0)
LYMPHS ABS: 2.6 10*3/uL (ref 0.7–4.0)
Lymphocytes Relative: 39 % (ref 12–46)
MCH: 25.9 pg — ABNORMAL LOW (ref 26.0–34.0)
MCHC: 34.4 g/dL (ref 30.0–36.0)
MCV: 75.3 fL — ABNORMAL LOW (ref 78.0–100.0)
MPV: 9.5 fL (ref 8.6–12.4)
Monocytes Absolute: 0.5 10*3/uL (ref 0.1–1.0)
Monocytes Relative: 8 % (ref 3–12)
Neutro Abs: 3.4 10*3/uL (ref 1.7–7.7)
Neutrophils Relative %: 51 % (ref 43–77)
PLATELETS: 341 10*3/uL (ref 150–400)
RBC: 5.63 MIL/uL (ref 4.22–5.81)
RDW: 14.6 % (ref 11.5–15.5)
WBC: 6.6 10*3/uL (ref 4.0–10.5)

## 2014-08-24 LAB — TSH: TSH: 0.44 u[IU]/mL (ref 0.350–4.500)

## 2014-08-24 LAB — POCT URINALYSIS DIPSTICK
BILIRUBIN UA: NEGATIVE
Blood, UA: NEGATIVE
Glucose, UA: NEGATIVE
Ketones, UA: NEGATIVE
Leukocytes, UA: NEGATIVE
Nitrite, UA: NEGATIVE
Spec Grav, UA: 1.015
Urobilinogen, UA: 0.2
pH, UA: 7

## 2014-08-24 MED ORDER — KETOCONAZOLE 2 % EX SHAM: 1.0000 "application " | MEDICATED_SHAMPOO | CUTANEOUS | Status: DC

## 2014-08-24 MED ORDER — BETAMETHASONE DIPROPIONATE 0.05 % EX LOTN: TOPICAL_LOTION | Freq: Two times a day (BID) | CUTANEOUS | Status: AC

## 2014-08-24 MED ORDER — AMLODIPINE BESYLATE 10 MG PO TABS: 10.0000 mg | ORAL_TABLET | Freq: Every day | ORAL | Status: DC

## 2014-08-24 NOTE — Patient Instructions (Addendum)
I will contact you with your lab results as soon as they are available.   If you have not heard from me in 2 weeks, please contact me.  The fastest way to get your results is to register for My Chart (see the instructions on the last page of this printout).  Continue the Delsym and cough drops as needed. Consider adding an OTC antihistamine (like Allegra, Claritin or Zyrtec), which can help to reduce the airway irrittion and drainage that is likely adding to your cough.  If your headaches begin to occur more frequently again, let me know. We'll get you in with a neurologist.  Try changing in to dry clothes (or at least undergarments) mid-way through your day.   Drink at least 64 ounces of water daily. Consider a humidifier for the room where you sleep. Bathe once daily. Avoid using HOT water, as it dries skin. Avoid deodorant soaps (Dial is the worst!) and stick with gentle cleansers (I like Cetaphil Liquid Cleanser). After bathing, dry off completely, then apply a thick emollient cream (I like Cetaphil Moisturizing Cream). Apply the cream twice daily, or more!
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
## 2014-08-25 LAB — HIV ANTIBODY (ROUTINE TESTING W REFLEX): HIV: NONREACTIVE

## 2014-10-25 ENCOUNTER — Encounter: Payer: Self-pay | Admitting: Family Medicine

## 2014-10-25 ENCOUNTER — Ambulatory Visit (INDEPENDENT_AMBULATORY_CARE_PROVIDER_SITE_OTHER): Payer: Commercial Managed Care - HMO | Admitting: Family Medicine

## 2014-10-25 VITALS — BP 142/95 | HR 90 | Temp 98.5°F | Resp 16 | Ht 71.5 in | Wt 217.8 lb

## 2014-10-25 DIAGNOSIS — R12 Heartburn: Secondary | ICD-10-CM | POA: Diagnosis not present

## 2014-10-25 DIAGNOSIS — Z113 Encounter for screening for infections with a predominantly sexual mode of transmission: Secondary | ICD-10-CM

## 2014-10-25 DIAGNOSIS — B356 Tinea cruris: Secondary | ICD-10-CM

## 2014-10-25 MED ORDER — PANTOPRAZOLE SODIUM 40 MG PO TBEC: 40.0000 mg | DELAYED_RELEASE_TABLET | Freq: Every day | ORAL | Status: DC

## 2014-10-25 MED ORDER — TERBINAFINE HCL 250 MG PO TABS: 250.0000 mg | ORAL_TABLET | Freq: Every day | ORAL | Status: DC

## 2014-10-25 MED ORDER — SUCRALFATE 1 G PO TABS
1.0000 g | ORAL_TABLET | Freq: Three times a day (TID) | ORAL | Status: DC
Start: 2014-10-25 — End: 2015-02-20

## 2014-10-25 NOTE — Progress Notes (Signed)
[facility] [address], [place] [address] [phone]  Date:  10/25/2014   Name:  Eugene Gomez   DOB:  12/24/1980   MRN:  [id number]  PCP:  JEFFERY,CHELLE, PA-C    Chief Complaint: Heartburn; Rash; and boils   History of Present Illness:  Eugene Gomez is a 34 y.o. very pleasant male patient who presents with the following:  He was here in July with likely seborrhea capitis He has been using nizoral shampoo- has also been using this for a rash on his shoulders, but notes that this rash is persisting or even getting worse.  He has noted heart burn for a couple of weeks.  He has been taking zantac which helps for a little while. It seems to occur with eating really any type pf food.  He has "always" had heartburn, but it has never been this severe and persistent.   He will notice sx about 30 minutes after eating anything, and also at night The heart burn will respond to the zantac or to eating baking soda  He has also noted jock itch for a couple of weeks He is outdoors and sweating a lot for work.   The area feels uncomfortable and itching  He does not have any heart trouble.   No exertional CP  He woud like to have STI testing just to be sure- no dysuria, no penile discharge  Patient Active Problem List   Diagnosis Date Noted  . Seborrhea capitis 08/24/2014  . Folliculitis 08/24/2014  . Excessive sweating 08/24/2014  . Acute meniscal tear of left knee 08/24/2014  . Left shoulder pain 08/24/2014  . Hypertension 07/02/2011    Past Medical History  Diagnosis Date  . Hypertension     Past Surgical History  Procedure Laterality Date  . Wisdom tooth extraction    . Dental surgery Left     Social History  Substance Use Topics  . Smoking status: Current Some Day Smoker    Types: Cigars  . Smokeless tobacco: Never Used     Comment: MAYBE 5 OR 6 DAYS  . Alcohol Use: 0.6 - 6.0 oz/week    1-10 Standard drinks or equivalent per week   Comment: MIXED DRINKS    Family History  Problem Relation Age of Onset  . Hypertension Mother   . Anemia Mother   . Hypertension Father   . Diabetes Father   . Hypertension Brother   . Drug abuse Brother   . Hypertension Maternal Aunt   . Hypertension Maternal Uncle   . Hypertension Paternal Aunt   . Glaucoma Paternal Aunt   . Diabetes Paternal Aunt   . Hypertension Paternal Uncle   . Diabetes Paternal Uncle   . Cancer Sister     brain    Allergies  Allergen Reactions  . Aspirin Hives    Medication list has been reviewed and updated.  Current Outpatient Prescriptions on File Prior to Visit  Medication Sig Dispense Refill  . amLODipine (NORVASC) 10 MG tablet Take 1 tablet (10 mg total) by mouth daily. 90 tablet 3  . betamethasone dipropionate 0.05 % lotion Apply topically 2 (two) times daily. 60 mL 6  . ketoconazole (NIZORAL) 2 % shampoo Apply 1 application topically 2 (two) times a week. 120 mL 6  . triamcinolone cream (KENALOG) 0.1 % Apply 1 application topically 2 (two) times daily. 45 g 3   No current facility-administered medications on file prior to visit.    Review  of Systems:  As per HPI- otherwise negative.   Physical Examination: Filed Vitals:   10/25/14 1314  BP: 157/97  Pulse: 90  Temp: 98.5 F (36.9 C)  Resp: 16   Filed Vitals:   10/25/14 1314  Height: 5' 11.5" (1.816 m)  Weight: 217 lb 12.8 oz (98.793 kg)   Body mass index is 29.96 kg/(m^2). Ideal Body Weight: Weight in (lb) to have BMI = 25: 181.4  GEN: WDWN, NAD, Non-toxic, A & O x 3, overweight, large build HEENT: Atraumatic, Normocephalic. Neck supple. No masses, No LAD. Ears and Nose: No external deformity. CV: RRR, No M/G/R. No JVD. No thrill. No extra heart sounds. PULM: CTA B, no wheezes, crackles, rhonchi. No retractions. No resp. distress. No accessory muscle use. ABD: S, NT, ND. No rebound. No HSM. EXTR: No c/c/e NEURO Normal gait.  PSYCH: Normally interactive. Conversant.  Not depressed or anxious appearing.  Calm demeanor.  GU: mild jock itch in the inguinall folds, OW normal.  No penile discharge or lesions Tinea corporis on his shoulders (vs possible tinea versicolor)  Assessment and Plan: Heartburn - Plan: sucralfate (CARAFATE) 1 G tablet, pantoprazole (PROTONIX) 40 MG tablet  Routine screening for STI (sexually transmitted infection) - Plan: Hepatitis B surface antibody, Hepatitis B surface antigen, Hepatitis C antibody, RPR, GC/Chlamydia Probe Amp  Tinea cruris - Plan: terbinafine (LAMISIL) 250 MG tablet  Treat for GERD with carafate and protonix STI labs pending Use terbinafine for rash in groin and on shoulders- he will let me know if not better soon  Signed Abbe Amsterdam, MD

## 2014-10-25 NOTE — Patient Instructions (Signed)
I will be in touch with your tests asap - I will send tham to you on mychart Try the carafate with meals and at bedtime, and change the zantac for protonix.   Please let me know if you are not doing better in a couple of weeks Try taking the terbinafine daily for 2 weeks for jock itch and also the rash on your shoulders and back; let me know if not better soon!

## 2014-10-26 ENCOUNTER — Encounter: Payer: Self-pay | Admitting: Family Medicine

## 2014-10-26 DIAGNOSIS — Z789 Other specified health status: Secondary | ICD-10-CM | POA: Insufficient documentation

## 2014-10-26 LAB — GC/CHLAMYDIA PROBE AMP
CT PROBE, AMP APTIMA: NEGATIVE
GC PROBE AMP APTIMA: NEGATIVE

## 2014-10-26 LAB — HEPATITIS B SURFACE ANTIGEN: Hepatitis B Surface Ag: NEGATIVE

## 2014-10-26 LAB — HEPATITIS C ANTIBODY: HCV Ab: NEGATIVE

## 2014-10-26 LAB — HEPATITIS B SURFACE ANTIBODY, QUANTITATIVE: Hepatitis B-Post: 84.1 m[IU]/mL

## 2014-10-26 LAB — RPR

## 2015-02-20 ENCOUNTER — Encounter: Payer: Self-pay | Admitting: Physician Assistant

## 2015-02-20 ENCOUNTER — Ambulatory Visit (INDEPENDENT_AMBULATORY_CARE_PROVIDER_SITE_OTHER): Payer: Commercial Managed Care - HMO | Admitting: Physician Assistant

## 2015-02-20 VITALS — BP 138/100 | HR 96 | Temp 98.8°F | Resp 16 | Ht 71.5 in | Wt 231.8 lb

## 2015-02-20 DIAGNOSIS — I10 Essential (primary) hypertension: Secondary | ICD-10-CM | POA: Diagnosis not present

## 2015-02-20 DIAGNOSIS — S83207D Unspecified tear of unspecified meniscus, current injury, left knee, subsequent encounter: Secondary | ICD-10-CM

## 2015-02-20 DIAGNOSIS — R12 Heartburn: Secondary | ICD-10-CM | POA: Diagnosis not present

## 2015-02-20 DIAGNOSIS — M25512 Pain in left shoulder: Secondary | ICD-10-CM

## 2015-02-20 MED ORDER — MELOXICAM 15 MG PO TABS: 15.0000 mg | ORAL_TABLET | Freq: Every day | ORAL | Status: DC

## 2015-02-20 MED ORDER — PANTOPRAZOLE SODIUM 40 MG PO TBEC: 40.0000 mg | DELAYED_RELEASE_TABLET | Freq: Every day | ORAL | Status: DC

## 2015-02-20 NOTE — Patient Instructions (Addendum)
Try Selsun Blue shampoo on the skin where the dry patches have returned. Or, use the Nizoral shampoo I gave you for your scalp.  Put your blood pressure pill with your toothbrush to help you remember to take it EVERY SINGLE DAY.   Sleeping more than 4 hours/day will help your health, in so many ways!  Did you know that you begin to benefit from quitting smoking within the first twenty minutes? It's TRUE.  At 20 minutes: -blood pressure decreases -pulse rate drops -body temperature of hands and feet increases  At 8 hours: -carbon monoxide level in blood drops to normal -oxygen level in blood increases to normal  At 24 hours: -the chance of heart attack decreases  At 48 hours: -nerve endings start regrowing -ability to smell and taste is enhanced  2 weeks-3 months: -circulation improves -walking becomes easier -lung function improves  1-9 months: -coughing, sinus congestion, fatigue and shortness of breath decreases  1 year: -excess risk of heart disease is decreased to HALF that of a smoker  5 years: Stroke risk is reduced to that of people who have never smoked  10 years: -risk of lung cancer drops to as little as half that of continuing smokers -risk of cancer of the mouth, throat, esophagus, bladder, kidney and pancreas decreases -risk of ulcer decreases  15 years -risk of heart disease is now similar to that of people who have never smoked -risk of death returns to nearly the level of people who have never smoked

## 2015-02-20 NOTE — Progress Notes (Signed)
Patient ID: Eugene Gomez, male    DOB: May 08, 1980, 35 y.o.   MRN: [id number]  PCP: Olene FlossJEFFERY,Jonavan Vanhorn, PA-C  Subjective:   Chief Complaint  Patient presents with  . Follow-up  . Hypertension  . knot on left groin    treated on 02/09/15 with generic Bactrim  . Medication Refill    Protonix, Amlodipine 10 mg    HPI Presents for evaluation of HTN.  At his visit to establish care with me in July, he was continued on Amlodipine, and reported frequent headaches, but none in the previous 30-days. He also had a cough and seborrhea capitis.  Forgets the amlodipine on days he doesn't work, so misses about 3 doses/week.  Seen at [facility] on 02/09/15 after we closed for a painful lump in the LEFT groin. He thought it was a boil, but was told that it was a skin infection or a spider bite. He was prescribed Bactrim and hydrocodone, with improvement but not complete resolution.  Started smoking again. Uses it for stress relief. Working 2 jobs, doesn't get enough sleep. Kids play travel soccer, dance, etc.  Missed a couple of days of pantoprazole and had severe symptom recurrence.  He has chronic knee and shoulder pain. Has seen ortho and undergone what sounds like a fairly significant evaluation. Known meniscal tear in the knee. Steroid injections of the shoulder have not helped.   Review of Systems  Constitutional: Positive for fatigue (only sleeping about 4 hours/night due to work schedule).  HENT: Negative.   Eyes: Negative.   Respiratory: Negative.   Cardiovascular: Negative.   Gastrointestinal: Negative.   Endocrine: Negative.   Genitourinary: Negative.   Musculoskeletal: Negative.   Skin: Positive for rash and wound.  Allergic/Immunologic: Negative.   Neurological: Negative.   Hematological: Negative.   Psychiatric/Behavioral: Negative.        Patient Active Problem List   Diagnosis Date Noted  . Hepatitis B immune 10/26/2014  . Seborrhea capitis 08/24/2014  .  Folliculitis 08/24/2014  . Excessive sweating 08/24/2014  . Acute meniscal tear of left knee 08/24/2014  . Left shoulder pain 08/24/2014  . Hypertension 07/02/2011     Prior to Admission medications   Medication Sig Start Date End Date Taking? Authorizing Provider  amLODipine (NORVASC) 10 MG tablet Take 1 tablet (10 mg total) by mouth daily. 08/24/14  Yes Reaghan Kawa, PA-C  betamethasone dipropionate 0.05 % lotion Apply topically 2 (two) times daily. 08/24/14 08/24/15 Yes Annete Ayuso, PA-C  ketoconazole (NIZORAL) 2 % shampoo Apply 1 application topically 2 (two) times a week. 08/24/14  Yes Yuleimy Kretz, PA-C  pantoprazole (PROTONIX) 40 MG tablet Take 1 tablet (40 mg total) by mouth daily. 10/25/14  Yes Gwenlyn FoundJessica C Copland, MD  triamcinolone cream (KENALOG) 0.1 % Apply 1 application topically 2 (two) times daily. 01/26/14  Yes Elvina SidleKurt Lauenstein, MD  sucralfate (CARAFATE) 1 G tablet Take 1 tablet (1 g total) by mouth 4 (four) times daily -  with meals and at bedtime. Patient not taking: Reported on 02/20/2015 10/25/14   Gwenlyn FoundJessica C Copland, MD  terbinafine (LAMISIL) 250 MG tablet Take 1 tablet (250 mg total) by mouth daily. Patient not taking: Reported on 02/20/2015 10/25/14   Pearline CablesJessica C Copland, MD     Allergies  Allergen Reactions  . Aspirin Hives       Objective:  Physical Exam  Constitutional: He is oriented to person, place, and time. Vital signs are normal. He appears well-developed and well-nourished. He is active and cooperative.  No distress.  BP 138/100 mmHg  Pulse 96  Temp(Src) 98.8 F (37.1 C) (Oral)  Resp 16  Ht 5' 11.5" (1.816 m)  Wt 231 lb 12.8 oz (105.144 kg)  BMI 31.88 kg/m2  SpO2 98%  HENT:  Head: Normocephalic and atraumatic.  Right Ear: Hearing normal.  Left Ear: Hearing normal.  Eyes: Conjunctivae are normal. No scleral icterus.  Neck: Normal range of motion. Neck supple. No thyromegaly present.  Cardiovascular: Normal rate, regular rhythm and normal heart  sounds.   Pulses:      Radial pulses are 2+ on the right side, and 2+ on the left side.  Pulmonary/Chest: Effort normal and breath sounds normal.  Musculoskeletal:       Left shoulder: He exhibits decreased range of motion, tenderness and pain. He exhibits no bony tenderness, no swelling, no effusion, no crepitus, no deformity, no laceration, no spasm, normal pulse and normal strength.  Lymphadenopathy:       Head (right side): No tonsillar, no preauricular, no posterior auricular and no occipital adenopathy present.       Head (left side): No tonsillar, no preauricular, no posterior auricular and no occipital adenopathy present.    He has no cervical adenopathy.       Right: No supraclavicular adenopathy present.       Left: No supraclavicular adenopathy present.  Neurological: He is alert and oriented to person, place, and time. No sensory deficit.  Skin: Skin is warm, dry and intact. Lesion and rash noted. Rash is macular (scattered across the upper arms and chest and upper back). No cyanosis or erythema. Nails show no clubbing.     Psychiatric: He has a normal mood and affect.           Assessment & Plan:   1. Essential hypertension Above goal. However, he is only taking the amlodipine 4 out of 7 days each week. We elect to try taking it consistently before adding another agent. Would select an ACEI. Also, quitting smoking and increased sleep will help. He intends to quit smoking, but isn't sure he can afford to leave his part time job.  2. Acute meniscal tear of left knee, subsequent encounter 3. Left shoulder pain He's been seen by ortho, but desires to change providers and go to the office where his daughter has been seen ([facility]). - Ambulatory referral to Orthopedic Surgery  4. Heartburn Continue PPI therapy. Healthy eating. Quit smoking. Consider changing to H2 blocker. - pantoprazole (PROTONIX) 40 MG tablet; Take 1 tablet (40 mg total) by mouth daily.  Dispense:  90 tablet; Refill: 3   Return in about 4 weeks (around 03/20/2015) for re-evaluation of blood pressure.   Fernande Bras, PA-C Physician Assistant-Certified [facility]

## 2015-02-22 ENCOUNTER — Ambulatory Visit: Payer: Self-pay | Admitting: Physician Assistant

## 2015-03-20 ENCOUNTER — Ambulatory Visit: Payer: Commercial Managed Care - HMO | Admitting: Physician Assistant

## 2015-04-17 ENCOUNTER — Other Ambulatory Visit: Payer: Self-pay | Admitting: Family Medicine

## 2015-04-24 ENCOUNTER — Other Ambulatory Visit: Payer: Self-pay | Admitting: Family Medicine

## 2015-04-24 NOTE — Telephone Encounter (Signed)
Do you want to refill? 

## 2015-05-01 ENCOUNTER — Other Ambulatory Visit: Payer: Self-pay

## 2015-05-01 DIAGNOSIS — L219 Seborrheic dermatitis, unspecified: Secondary | ICD-10-CM

## 2015-05-01 MED ORDER — TRIAMCINOLONE ACETONIDE 0.1 % EX CREA: 1.0000 "application " | TOPICAL_CREAM | Freq: Two times a day (BID) | CUTANEOUS | Status: DC

## 2015-05-22 ENCOUNTER — Ambulatory Visit: Payer: Commercial Managed Care - HMO | Admitting: Physician Assistant

## 2015-07-17 ENCOUNTER — Ambulatory Visit (INDEPENDENT_AMBULATORY_CARE_PROVIDER_SITE_OTHER): Payer: Commercial Managed Care - HMO | Admitting: Physician Assistant

## 2015-07-17 ENCOUNTER — Encounter: Payer: Self-pay | Admitting: Physician Assistant

## 2015-07-17 VITALS — BP 144/96 | HR 91 | Temp 98.6°F | Resp 16 | Ht 71.5 in | Wt 227.6 lb

## 2015-07-17 DIAGNOSIS — I10 Essential (primary) hypertension: Secondary | ICD-10-CM

## 2015-07-17 DIAGNOSIS — M25512 Pain in left shoulder: Secondary | ICD-10-CM

## 2015-07-17 DIAGNOSIS — S83207D Unspecified tear of unspecified meniscus, current injury, left knee, subsequent encounter: Secondary | ICD-10-CM

## 2015-07-17 DIAGNOSIS — L210 Seborrhea capitis: Secondary | ICD-10-CM

## 2015-07-17 DIAGNOSIS — Z6833 Body mass index (BMI) 33.0-33.9, adult: Secondary | ICD-10-CM | POA: Insufficient documentation

## 2015-07-17 MED ORDER — AMLODIPINE BESYLATE 10 MG PO TABS: 10.0000 mg | ORAL_TABLET | Freq: Every day | ORAL | Status: DC

## 2015-07-17 MED ORDER — LISINOPRIL 5 MG PO TABS: 5.0000 mg | ORAL_TABLET | Freq: Every day | ORAL | Status: DC

## 2015-07-17 NOTE — Progress Notes (Signed)
Subjective:    Patient ID: Eugene Gomez, male    DOB: Jun 16, 1980, 35 y.o.   MRN: [id number] Chief Complaint  Patient presents with  . Follow-up  . Hypertension  . heartburn    "better, don't keep me up at night."  . left knee    "okay", hurts when it rains  . left shoulder    "I just learn to deal with it"    HPI  Patient presents today for follow up and management of hypertension.    Patient has long history of uncontrolled hypertension.  He does not monitor his BP at home but is compliant with his medication, amlopidine  daily. He works 2 jobs to support his family of 4, and states "I sit down all day, I do not have time to exercise and eat right, I eat what's there and cheap."  Gets an average of 5hrs sleep/night, because of work, "but I sleep well when I do."  Admits to swelling in fingers bilaterally several days a week, and still smoking, cut down to 1 or 2 a week.  Denies headache, dizziness, lightheadedness, chest pain, SOB, change in vision, cough, problems with urination or BM, hematuria, D, V, dizziness,   Rash on scalp, face improved with ketoconazole shampoo use every other day and betamethasone lotion use daily.  States "I have to use it everyday or it gets really bad, I tried to stop but it got so much worse." Daughter currently seeing dermatologist for similar rash.  Heartburn greatly improved protonix, continues to take daily.  Continues to have chronic pain in shoulder, states "I have learned to live with it."  Review of Systems All others negative except those listed in HPI.     Objective: BP 144/96 mmHg  Pulse 91  Temp(Src) 98.6 F (37 C) (Oral)  Resp 16  Ht 5' 11.5" (1.816 m)  Wt 227 lb 9.6 oz (103.239 kg)  BMI 31.30 kg/m2  SpO2 98%   Physical Exam  Constitutional: He is oriented to person, place, and time. He appears well-developed and well-nourished.  HENT:  Head: Normocephalic and atraumatic.  Eyes: Conjunctivae and EOM are normal. Pupils  are equal, round, and reactive to light.  Cardiovascular: Normal rate, regular rhythm, normal heart sounds and intact distal pulses.   Pulmonary/Chest: Effort normal and breath sounds normal.  Neurological: He is alert and oriented to person, place, and time.  Skin: Rash noted.  Discrete maculopapular rash distributed along hair line of scalp and facial hair.    Discrete oval, scaly, plaques/patches on left side of neck, near clavicle and distributed around base of neck to upper back.  Psychiatric: He has a normal mood and affect. His behavior is normal. Judgment and thought content normal.          Assessment & Plan:  1. Essential hypertension -Not controlled by current medical regimen, Added lisinopril  daily to try to achieve better control.  Patient instructed to take them together at the same time each day to maintain compliance. -CMP results pending, provide baseline for multi-organs function prior to start of new medication. -Recommended patient adhere to low sodium, low fat diet as well as exercising as much as possible, to maintain good health recommended to do 38min/day, 5 day/week. -Urged patient to quit smoking and informed him when he is ready we have tools to help in the process. - lisinopril (PRINIVIL,ZESTRIL) 5 MG tablet; Take 1 tablet (5 mg total) by mouth daily.  Dispense: 90 tablet; Refill: 3 -  amLODipine (NORVASC) 10 MG tablet; Take 1 tablet (10 mg total) by mouth daily.  Dispense: 90 tablet; Refill: 3 - Comprehensive metabolic panel  2. Seborrhea capitis -Informed patient of risks associated with daily use of steroid creams such as skin bleaching and thinning, and teleangiectases.   -Recommended he only use betamethasone 1x day for 2-4 weeks, then take a break for at least 2 weeks.  -Ketoconazole shampoo may be used daily until remission is achieved then 1x week to prevent relapse. -Recommended patient return to dermatologist for further evaluation and treatment  alternatives.  Patient to tell inform office whether he would prefer the previous one he's seen or be referred to Dr. Terri PiedraLupton.  3. Acute meniscal tear of left knee, subsequent encounter 4. Left shoulder pain Muscle pain in shoulder and knee are stable.  Although they continue to be problematic he is not interested in treatment at this time.  Instructed patient to return in 6 weeks to evaluate new hypertension management.  Can call or come sooner if questions or concerns arise.  Shea Swalley P. Carlin Attridge, PA-S

## 2015-07-17 NOTE — Progress Notes (Signed)
Patient ID: Eugene Gomez, male    DOB: 1980-11-01, 35 y.o.   MRN: [id number]  PCP: Olene Floss  Subjective:   Chief Complaint  Patient presents with  . Follow-up  . Hypertension  . heartburn    "better, don't keep me up at night."  . left knee    "okay", hurts when it rains  . left shoulder    "I just learn to deal with it"    HPI Presents for evaluation of HTN.  Tolerating medications without difficulty or adverse effects. Continues to work 2 jobs, with very little time for sleep, usually about 5 hours/night. He did get a promotion at the full time position, and now does more sitting. He hopes to be able to quit the other job in a few months.  Continues to smoke, but it cutting back. Swelling in his fingers a couple of times each week, but doesn't interfere with his activities.  Heartburn is significantly improved with regular use of PPI. Shoulder pain continues, but he's not interested in additional evaluation or treatment at this time. Seborrhea is improved with daily betamethasone lotion and QOD ketoconazole shampoo. If he doesn't use the steroid every day, the redness and itching gets "really bad," worst in the moustache area. His daughter has developed similar symptoms and is seeing a different dermatologist than he saw.    Review of Systems No chest pain, SOB, HA, dizziness, vision change, N/V, diarrhea, constipation, dysuria, urinary urgency or frequency, myalgias, arthralgias or rash.     Patient Active Problem List   Diagnosis Date Noted  . BMI 31.0-31.9,adult 07/17/2015  . Hepatitis B immune 10/26/2014  . Seborrhea capitis 08/24/2014  . Folliculitis 08/24/2014  . Excessive sweating 08/24/2014  . Acute meniscal tear of left knee 08/24/2014  . Left shoulder pain 08/24/2014  . Hypertension 07/02/2011     Prior to Admission medications   Medication Sig Start Date End Date Taking? Authorizing Provider  amLODipine (NORVASC) 10 MG tablet Take 1  tablet (10 mg total) by mouth daily. 08/24/14  Yes Andreika Vandagriff, PA-C  betamethasone dipropionate 0.05 % lotion Apply topically 2 (two) times daily. 08/24/14 08/24/15 Yes Haidan Nhan, PA-C  ketoconazole (NIZORAL) 2 % shampoo Apply 1 application topically 2 (two) times a week. 08/24/14  Yes Bridgit Eynon, PA-C  pantoprazole (PROTONIX) 40 MG tablet Take 1 tablet (40 mg total) by mouth daily. 02/20/15  Yes Jennett Tarbell, PA-C  triamcinolone cream (KENALOG) 0.1 % Apply 1 application topically 2 (two) times daily. 05/01/15  Yes Porfirio Oar, PA-C     Allergies  Allergen Reactions  . Aspirin Hives       Objective:  Physical Exam  Constitutional: He is oriented to person, place, and time. He appears well-developed and well-nourished. He is active and cooperative. No distress.  BP 144/96 mmHg  Pulse 91  Temp(Src) 98.6 F (37 C) (Oral)  Resp 16  Ht 5' 11.5" (1.816 m)  Wt 227 lb 9.6 oz (103.239 kg)  BMI 31.30 kg/m2  SpO2 98%  HENT:  Head: Normocephalic and atraumatic.  Right Ear: Hearing normal.  Left Ear: Hearing normal.  Eyes: Conjunctivae are normal. No scleral icterus.  Neck: Normal range of motion. Neck supple. No thyromegaly present.  Cardiovascular: Normal rate, regular rhythm and normal heart sounds.   Pulses:      Radial pulses are 2+ on the right side, and 2+ on the left side.  Pulmonary/Chest: Effort normal and breath sounds normal.  Lymphadenopathy:  Head (right side): No tonsillar, no preauricular, no posterior auricular and no occipital adenopathy present.       Head (left side): No tonsillar, no preauricular, no posterior auricular and no occipital adenopathy present.    He has no cervical adenopathy.       Right: No supraclavicular adenopathy present.       Left: No supraclavicular adenopathy present.  Neurological: He is alert and oriented to person, place, and time. No sensory deficit.  Skin: Skin is warm, dry and intact. Rash noted. Rash is macular (RIGHT  shoulder/upper chest/back, consistent with tinea versicolor). No cyanosis or erythema. Nails show no clubbing.  Psychiatric: He has a normal mood and affect. His speech is normal and behavior is normal.           Assessment & Plan:   1. Essential hypertension Continue amlodipine. Add lisinopril. - lisinopril (PRINIVIL,ZESTRIL) 5 MG tablet; Take 1 tablet (5 mg total) by mouth daily.  Dispense: 90 tablet; Refill: 3 - amLODipine (NORVASC) 10 MG tablet; Take 1 tablet (10 mg total) by mouth daily.  Dispense: 90 tablet; Refill: 3 - Comprehensive metabolic panel  2. Seborrhea capitis I'd like him to see dermatology again. Perhaps there is an alternative to daily topical steroid use. He'll let me know if he needs a referral to the specialist his daughter is seeing or if he will go back to the one he saw previously.  3. Acute meniscal tear of left knee, subsequent encounter 4. Left shoulder pain Declines additional evaluation/treatment.     Fernande Brashelle S. Constance Hackenberg, PA-C Physician Assistant-Certified [facility]

## 2015-07-17 NOTE — Patient Instructions (Addendum)
Let me know if you want to go back to the previous dermatologist, or for me to refer you to Dr. Dorita SciaraLupton's office.  We need to know what alternatives we have other than daily steroid lotion.    IF you received an x-ray today, you will receive an invoice from [facility]. Please contact [facility] at [phone] with questions or concerns regarding your invoice.   IF you received labwork today, you will receive an invoice from [facility]. Please contact [facility] at [phone] with questions or concerns regarding your invoice.   Our billing staff will not be able to assist you with questions regarding bills from these companies.  You will be contacted with the lab results as soon as they are available. The fastest way to get your results is to activate your My Chart account. Instructions are located on the last page of this paperwork. If you have not heard from us regarding the results in 2 weeks, please contact this office.

## 2015-07-18 LAB — COMPREHENSIVE METABOLIC PANEL
ALBUMIN: 4.3 g/dL (ref 3.6–5.1)
ALT: 25 U/L (ref 9–46)
AST: 15 U/L (ref 10–40)
Alkaline Phosphatase: 98 U/L (ref 40–115)
BUN: 9 mg/dL (ref 7–25)
CALCIUM: 9.7 mg/dL (ref 8.6–10.3)
CHLORIDE: 105 mmol/L (ref 98–110)
CO2: 25 mmol/L (ref 20–31)
Creat: 1.1 mg/dL (ref 0.60–1.35)
Glucose, Bld: 109 mg/dL — ABNORMAL HIGH (ref 65–99)
POTASSIUM: 3.7 mmol/L (ref 3.5–5.3)
Sodium: 141 mmol/L (ref 135–146)
TOTAL PROTEIN: 7.3 g/dL (ref 6.1–8.1)
Total Bilirubin: 0.3 mg/dL (ref 0.2–1.2)

## 2015-08-28 ENCOUNTER — Ambulatory Visit: Payer: Self-pay | Admitting: Physician Assistant

## 2015-08-29 ENCOUNTER — Ambulatory Visit (INDEPENDENT_AMBULATORY_CARE_PROVIDER_SITE_OTHER): Payer: Commercial Managed Care - HMO | Admitting: Physician Assistant

## 2015-08-29 VITALS — BP 138/88 | HR 78 | Temp 98.1°F | Resp 17 | Ht 72.5 in | Wt 225.0 lb

## 2015-08-29 DIAGNOSIS — I10 Essential (primary) hypertension: Secondary | ICD-10-CM

## 2015-08-29 DIAGNOSIS — R0981 Nasal congestion: Secondary | ICD-10-CM | POA: Diagnosis not present

## 2015-08-29 DIAGNOSIS — L210 Seborrhea capitis: Secondary | ICD-10-CM

## 2015-08-29 MED ORDER — BETAMETHASONE DIPROPIONATE 0.05 % EX LOTN: TOPICAL_LOTION | Freq: Two times a day (BID) | CUTANEOUS | Status: DC

## 2015-08-29 MED ORDER — AZELASTINE HCL 0.1 % NA SOLN: 2.0000 | Freq: Two times a day (BID) | NASAL | Status: DC

## 2015-08-29 NOTE — Progress Notes (Signed)
Patient ID: Eugene Gomez, male    DOB: Jun 05, 1980, 35 y.o.   MRN: [id number]  PCP: Porfirio Oar, PA-C  Subjective:   Chief Complaint  Patient presents with  . Follow-up    hbp   . Nasal Congestion    HPI Presents for evaluation of HTN.  He is tolerating the addition of lisinopril without difficulty. He isn't sure if the LE edema has improved since he wasn't actually aware of it when I noted it at the last visit. He has noticed that it is present in the afternoons/evenings and resolves overnight. No associated pain, redness. He is having increased urinary frequency with the lisinopril and notes that he's lost weight, to the point that his clothes are too loose.  Continues to have the rash on the upper/posterior trunk. He tried applying the triamcinolone cream, and using the ketoconazole shampoo as a body wash, with no change. Betamethasone lotion continues to do a good job controlling the scalp seborrhea. He forgot to think about whether he'd like to return to the previous dermatologist or see Dr. Terri Piedra, where his daughter now goes, but states that it will take a year to get another appointment with the previous dermatologist and that he doesn't have that kind of time.  He is experiencing some nasal congestion. He's having runny nose, some post-nasal drainage and cough. Mouth breathing and notes halitosis. Cough is productive of thick yellow sputum, for which he's using OTC Mucinex with minimal benefit. Symptoms have been improving over the past several days. Associated fatigue and chills. No fever, body aches, sore throat, ear pain/fullness, GI symptoms.    Review of Systems As above.    Patient Active Problem List   Diagnosis Date Noted  . BMI 30.0-30.9,adult 07/17/2015  . Hepatitis B immune 10/26/2014  . Seborrhea capitis 08/24/2014  . Folliculitis 08/24/2014  . Excessive sweating 08/24/2014  . Acute meniscal tear of left knee 08/24/2014  . Left shoulder pain 08/24/2014    . Hypertension 07/02/2011     Prior to Admission medications   Medication Sig Start Date End Date Taking? Authorizing Provider  amLODipine (NORVASC) 10 MG tablet Take 1 tablet (10 mg total) by mouth daily. 07/17/15  Yes Deatrice Spanbauer, PA-C  ketoconazole (NIZORAL) 2 % shampoo Apply 1 application topically 2 (two) times a week. 08/24/14  Yes Quinzell Malcomb, PA-C  lisinopril (PRINIVIL,ZESTRIL) 5 MG tablet Take 1 tablet (5 mg total) by mouth daily. 07/17/15  Yes Emmaly Leech, PA-C  pantoprazole (PROTONIX) 40 MG tablet Take 1 tablet (40 mg total) by mouth daily. 02/20/15  Yes Phylicia Mcgaugh, PA-C  triamcinolone cream (KENALOG) 0.1 % Apply 1 application topically 2 (two) times daily. 05/01/15  Yes Porfirio Oar, PA-C     Allergies  Allergen Reactions  . Aspirin Hives       Objective:  Physical Exam  Constitutional: He is oriented to person, place, and time. He appears well-developed and well-nourished. He is active and cooperative. No distress.  BP 138/88 mmHg  Pulse 78  Temp(Src) 98.1 F (36.7 C) (Oral)  Resp 17  Ht 6' 0.5" (1.842 m)  Wt 225 lb (102.059 kg)  BMI 30.08 kg/m2  SpO2 99%  HENT:  Head: Normocephalic and atraumatic.  Right Ear: Hearing, tympanic membrane, external ear and ear canal normal.  Left Ear: Hearing, tympanic membrane, external ear and ear canal normal.  Nose: Right sinus exhibits maxillary sinus tenderness (very mild). Right sinus exhibits no frontal sinus tenderness. Left sinus exhibits maxillary sinus tenderness (very  mild). Left sinus exhibits no frontal sinus tenderness.  Mouth/Throat: Uvula is midline, oropharynx is clear and moist and mucous membranes are normal. No oral lesions. No uvula swelling.  Eyes: Conjunctivae and EOM are normal. No scleral icterus.  Neck: Normal range of motion. Neck supple. No thyromegaly present.  Cardiovascular: Normal rate, regular rhythm and normal heart sounds.   Pulses:      Radial pulses are 2+ on the right side, and 2+  on the left side.  Trace non-pitting edema of both anterior tibias  Pulmonary/Chest: Effort normal and breath sounds normal.  Lymphadenopathy:       Head (right side): No tonsillar, no preauricular, no posterior auricular and no occipital adenopathy present.       Head (left side): No tonsillar, no preauricular, no posterior auricular and no occipital adenopathy present.    He has no cervical adenopathy.       Right: No supraclavicular adenopathy present.       Left: No supraclavicular adenopathy present.  Neurological: He is alert and oriented to person, place, and time. No sensory deficit.  Skin: Skin is warm, dry and intact. No rash noted. No cyanosis or erythema. Nails show no clubbing.  Psychiatric: He has a normal mood and affect. His speech is normal and behavior is normal.    Wt Readings from Last 3 Encounters:  08/29/15 225 lb (102.059 kg)  07/17/15 227 lb 9.6 oz (103.239 kg)  02/20/15 231 lb 12.8 oz (105.144 kg)          Assessment & Plan:   1. Essential hypertension COntrolled. Continue current treatment. Consider increasing lisinopril and reducing amlodipine in the future if LE edema becomes problematic. - Basic metabolic panel  2. Sinus congestion Doubt bacterial etiology, as his symptoms are improving without any treatment beyond supportive care and OTC Mucinex. Recommend he continue those and add Astelin NS. - azelastine (ASTELIN) 0.1 % nasal spray; Place 2 sprays into both nostrils 2 (two) times daily.  Dispense: 30 mL; Refill: 1  3. Seborrhea capitis and Tinea Versicolor No improvement in T versicolor with treatment thus far. The seborrhea capitis is well controlled, but I am concerned about the regular use of steroid lotion. He has been counseled on holding use x 1 week for every 2 weeks of continuous use. He is in agreement to see dermatology again, and is interested in seeing Dr. Terri PiedraLupton. - Ambulatory referral to Dermatology - betamethasone dipropionate 0.05 %  lotion; Apply topically 2 (two) times daily. Stop use x 1 week after every 2 weeks of daily use.  Dispense: 60 mL; Refill: 0   Return in about 6 months (around 02/29/2016) for re-evaluation of blood pressure.Fernande Bras.   Antoinetta Berrones S. Minor Iden, PA-C Physician Assistant-Certified [facility]

## 2015-08-29 NOTE — Progress Notes (Signed)
Subjective:    Patient ID: Eugene Gomez, male    DOB: 11-15-1980, 35 y.o.   MRN: [id number]  HPI  [name] presents for f/u for HTN. He is still having some swelling in his feet, but has not noticed any change since starting the lisinopril. He did not realize that he had swelling in his feet prior to being started on the lisinopril. No other issues.   He is also c/o sinus pressure and rhinorrhea with associated cough. Coughing up thick, yellowish sputum. He has been taking mucinex at home, but didn't feel like it helped. He does feel like it is gradually improving. Has had associated fatigue and chills, unsure about fevers. Denies myalgias, nausea, sore throat, fullness in ears, eye discharge.  He is also concerned that the dry patches on his back don't seem to be improving with the triamcinolone cream. Does feel like his scalp is much improved.   Review of Systems  Constitutional: Positive for chills and fatigue.  HENT: Positive for congestion, rhinorrhea and sinus pressure. Negative for postnasal drip.   Eyes: Negative.   Respiratory: Positive for cough. Negative for chest tightness and wheezing.   Cardiovascular: Positive for leg swelling. Negative for chest pain and palpitations.  Gastrointestinal: Negative for abdominal pain, diarrhea and constipation.  Genitourinary: Negative.   Musculoskeletal: Negative.   Skin: Negative.   Neurological: Positive for headaches. Negative for dizziness, weakness and numbness.       Tingling in arms occasionally  Hematological: Does not bruise/bleed easily.  Psychiatric/Behavioral: Negative for dysphoric mood. The patient is not nervous/anxious.     Current outpatient prescriptions:  .  amLODipine (NORVASC) 10 MG tablet, Take 1 tablet (10 mg total) by mouth daily., Disp: 90 tablet, Rfl: 3 .  ketoconazole (NIZORAL) 2 % shampoo, Apply 1 application topically 2 (two) times a week., Disp: 120 mL, Rfl: 6 .  lisinopril (PRINIVIL,ZESTRIL) 5 MG tablet,  Take 1 tablet (5 mg total) by mouth daily., Disp: 90 tablet, Rfl: 3 .  pantoprazole (PROTONIX) 40 MG tablet, Take 1 tablet (40 mg total) by mouth daily., Disp: 90 tablet, Rfl: 3 .  triamcinolone cream (KENALOG) 0.1 %, Apply 1 application topically 2 (two) times daily., Disp: 45 g, Rfl: 3   Allergies  Allergen Reactions  . Aspirin Hives      Objective:   Physical Exam  Constitutional: He is oriented to person, place, and time. Vital signs are normal. He appears well-developed and well-nourished. He is cooperative. No distress.  Blood pressure 138/88, pulse 78, temperature 98.1 F (36.7 C), temperature source Oral, resp. rate 17, height 6' 0.5" (1.842 m), weight 225 lb (102.059 kg), SpO2 99 %.  HENT:  Head: Normocephalic and atraumatic.  Right Ear: Hearing, tympanic membrane, external ear and ear canal normal.  Left Ear: Hearing, tympanic membrane, external ear and ear canal normal.  Nose: Mucosal edema present. Right sinus exhibits maxillary sinus tenderness. Left sinus exhibits maxillary sinus tenderness.  Mouth/Throat: Uvula is midline, oropharynx is clear and moist and mucous membranes are normal.  Eyes: Conjunctivae and lids are normal.  Fundoscopic exam:      The right eye shows no AV nicking, no exudate and no papilledema. The right eye shows red reflex.       The left eye shows no AV nicking, no exudate and no papilledema. The left eye shows red reflex.  Neck: Trachea normal and normal range of motion. Neck supple. No thyromegaly present.  Cardiovascular: Normal rate, regular rhythm, normal heart sounds  and intact distal pulses.   Pulmonary/Chest: Effort normal and breath sounds normal.  Abdominal: Soft. Bowel sounds are normal. There is no tenderness.  Musculoskeletal: Normal range of motion.  Lymphadenopathy:    He has no cervical adenopathy.  Neurological: He is alert and oriented to person, place, and time. He has normal strength.  Skin: Skin is warm, dry and intact.       Dry, silvery patches around posterior neck  Psychiatric: He has a normal mood and affect. His speech is normal and behavior is normal. Judgment and thought content normal. Cognition and memory are normal.        Assessment & Plan:  1. Essential HTN - continue with amlodipine 10 mg PO daily. Continue with lisinopril 5 mg PO daily. - follow up in 6 months - encouraged exercise and healthy eating  2. Sinus congestion - azelastine 0.1% nasal spray BID to help drain - continue with OTC mucinex to loosen sputum  3. Seborrhea capitus - continue with ketoconazole 2% shampoo - prescribed betamethasone dipropionate 0.05% lotion for scalp - ambulatory referral to dermatology made  Tresa EndoKelly Rayburn PA-S 08/29/2015

## 2015-08-29 NOTE — Patient Instructions (Signed)
     IF you received an x-ray today, you will receive an invoice from [facility]. Please contact [facility] at [phone] with questions or concerns regarding your invoice.   IF you received labwork today, you will receive an invoice from [facility]. Please contact [facility] at [phone] with questions or concerns regarding your invoice.   Our billing staff will not be able to assist you with questions regarding bills from these companies.  You will be contacted with the lab results as soon as they are available. The fastest way to get your results is to activate your My Chart account. Instructions are located on the last page of this paperwork. If you have not heard from us regarding the results in 2 weeks, please contact this office.      

## 2015-08-30 LAB — BASIC METABOLIC PANEL
BUN: 9 mg/dL (ref 7–25)
CALCIUM: 9.6 mg/dL (ref 8.6–10.3)
CO2: 22 mmol/L (ref 20–31)
CREATININE: 1 mg/dL (ref 0.60–1.35)
Chloride: 105 mmol/L (ref 98–110)
Glucose, Bld: 77 mg/dL (ref 65–99)
Potassium: 3.7 mmol/L (ref 3.5–5.3)
Sodium: 141 mmol/L (ref 135–146)

## 2016-02-11 ENCOUNTER — Other Ambulatory Visit: Payer: Self-pay | Admitting: Physician Assistant

## 2016-02-11 DIAGNOSIS — L210 Seborrhea capitis: Secondary | ICD-10-CM

## 2016-02-21 ENCOUNTER — Other Ambulatory Visit: Payer: Self-pay | Admitting: Physician Assistant

## 2016-02-21 DIAGNOSIS — R12 Heartburn: Secondary | ICD-10-CM

## 2016-04-02 ENCOUNTER — Ambulatory Visit (INDEPENDENT_AMBULATORY_CARE_PROVIDER_SITE_OTHER): Payer: Commercial Managed Care - HMO | Admitting: Physician Assistant

## 2016-04-02 VITALS — BP 146/86 | HR 81 | Temp 98.4°F | Resp 18 | Ht 72.5 in | Wt 227.2 lb

## 2016-04-02 DIAGNOSIS — I10 Essential (primary) hypertension: Secondary | ICD-10-CM

## 2016-04-02 DIAGNOSIS — Z23 Encounter for immunization: Secondary | ICD-10-CM

## 2016-04-02 MED ORDER — LISINOPRIL 10 MG PO TABS: 10.0000 mg | ORAL_TABLET | Freq: Every day | ORAL | 0 refills | Status: DC

## 2016-04-02 NOTE — Progress Notes (Signed)
Patient ID: Eugene Gomez, male    DOB: [birth year], 36 y.o.   MRN: [id number]  PCP: Porfirio Oarhelle Azadeh Hyder, PA-C  Chief Complaint  Patient presents with  . Follow-up    6 month   . Medication Refill    Subjective:   Presents for evaluation of HTN.  He reports tolerating meds well, without adverse effects.  He continues to work two jobs, resulting in insufficient time to sleep, exercise. His wife is cooking with health in mind, and he thinks that is helpful. When not working, both he and his wife are busy with their daughters' (ages 3113 and 489) many activities.   Review of Systems No chest pain, SOB, HA, dizziness, vision change, N/V, diarrhea, constipation, dysuria, urinary urgency or frequency, myalgias, arthralgias or rash.     Patient Active Problem List   Diagnosis Date Noted  . BMI 30.0-30.9,adult 07/17/2015  . Hepatitis B immune 10/26/2014  . Seborrhea capitis 08/24/2014  . Folliculitis 08/24/2014  . Excessive sweating 08/24/2014  . Acute meniscal tear of left knee 08/24/2014  . Left shoulder pain 08/24/2014  . Hypertension 07/02/2011     Prior to Admission medications   Medication Sig Start Date End Date Taking? Authorizing Provider  amLODipine (NORVASC) 10 MG tablet Take 1 tablet (10 mg total) by mouth daily. 07/17/15  Yes Selassie Spatafore, PA-C  azelastine (ASTELIN) 0.1 % nasal spray Place 2 sprays into both nostrils 2 (two) times daily. 08/29/15  Yes Melissa Tomaselli, PA-C  betamethasone dipropionate 0.05 % lotion APPLY TOPICALLY 2 (TWO) TIMES DAILY. STOP USE X 1 WEEK AFTER EVERY 2 WEEKS OF DAILY USE. 02/11/16  Yes Anias Bartol, PA-C  ketoconazole (NIZORAL) 2 % shampoo APPLY 1 APPLICATION TOPICALLY 2 (TWO) TIMES A WEEK. 02/11/16  Yes Adebayo Ensminger, PA-C  lisinopril (PRINIVIL,ZESTRIL) 5 MG tablet Take 1 tablet (5 mg total) by mouth daily. 07/17/15  Yes Deonne Rooks, PA-C  pantoprazole (PROTONIX) 40 MG tablet TAKE 1 TABLET (40 MG TOTAL) BY MOUTH DAILY. 02/23/16  Yes Porfirio Oarhelle  Paisleigh Maroney, PA-C     Allergies  Allergen Reactions  . Aspirin Hives       Objective:  Physical Exam  Constitutional: He is oriented to person, place, and time. He appears well-developed and well-nourished. He is active and cooperative. No distress.  BP (!) 146/86 (BP Location: Right Arm, Patient Position: Sitting, Cuff Size: Small)   Pulse 81   Temp 98.4 F (36.9 C) (Oral)   Resp 18   Ht 6' 0.5" (1.842 m)   Wt 227 lb 3.2 oz (103.1 kg)   SpO2 98%   BMI 30.39 kg/m   HENT:  Head: Normocephalic and atraumatic.  Right Ear: Hearing normal.  Left Ear: Hearing normal.  Eyes: Conjunctivae are normal. No scleral icterus.  Neck: Normal range of motion. Neck supple. No thyromegaly present.  Cardiovascular: Normal rate, regular rhythm and normal heart sounds.   Pulses:      Radial pulses are 2+ on the right side, and 2+ on the left side.  Pulmonary/Chest: Effort normal and breath sounds normal.  Lymphadenopathy:       Head (right side): No tonsillar, no preauricular, no posterior auricular and no occipital adenopathy present.       Head (left side): No tonsillar, no preauricular, no posterior auricular and no occipital adenopathy present.    He has no cervical adenopathy.       Right: No supraclavicular adenopathy present.       Left: No supraclavicular adenopathy present.  Neurological: He is alert and oriented to person, place, and time. No sensory deficit.  Skin: Skin is warm, dry and intact. No rash noted. No cyanosis or erythema. Nails show no clubbing.  Psychiatric: He has a normal mood and affect. His speech is normal and behavior is normal.       BP Readings from Last 3 Encounters:  04/02/16 (!) 146/86  08/29/15 138/88  07/17/15 (!) 144/96   Wt Readings from Last 3 Encounters:  04/02/16 227 lb 3.2 oz (103.1 kg)  08/29/15 225 lb (102.1 kg)  07/17/15 227 lb 9.6 oz (103.2 kg)       Assessment & Plan:   1. Essential hypertension Uncontrolled. As he has not been able  to increase his sleep or exercise, increase lisinopril from 5 mg to 10 mg daily.  - lisinopril (PRINIVIL,ZESTRIL) 10 MG tablet; Take 1 tablet (10 mg total) by mouth daily.  Dispense: 90 tablet; Refill: 0  2. Need for influenza vaccination - Flu Vaccine QUAD 36+ mos IM - Care order/instruction:  Return in about 6 weeks (around 05/14/2016) for re-evaluation of blood pressure.    Fernande Bras, PA-C Physician Assistant-Certified Primary Care at [facility]

## 2016-04-02 NOTE — Progress Notes (Signed)
Patient ID: Eugene Gomez, male    DOB: [birth year], 36 y.o.   MRN: [id number]  PCP: Porfirio Oarhelle Jeffery, PA-C  Chief Complaint  Patient presents with  . Follow-up    6 month   . Medication Refill    Subjective:   Presents for 6 month follow-up of hypertension. Pt is doing well on medication and compliant but working two jobs and lack of sleep from those are not helping him lower his blood pressure. He also hasn't had much time to exercise but is eating healthier thanks to his wife. She is making an effort to cook more healthy foods which he has been eating regularly. She works full time as well and between work and his two daughters (13 and 9) leaves very little time for other lifestyle improvements.  Review of Systems  All other systems reviewed and are negative.  Patient Active Problem List   Diagnosis Date Noted  . BMI 30.0-30.9,adult 07/17/2015  . Hepatitis B immune 10/26/2014  . Seborrhea capitis 08/24/2014  . Folliculitis 08/24/2014  . Excessive sweating 08/24/2014  . Acute meniscal tear of left knee 08/24/2014  . Left shoulder pain 08/24/2014  . Hypertension 07/02/2011     Prior to Admission medications   Medication Sig Start Date End Date Taking? Authorizing Provider  amLODipine (NORVASC) 10 MG tablet Take 1 tablet (10 mg total) by mouth daily. 07/17/15  Yes Chelle Jeffery, PA-C  azelastine (ASTELIN) 0.1 % nasal spray Place 2 sprays into both nostrils 2 (two) times daily. 08/29/15  Yes Chelle Jeffery, PA-C  betamethasone dipropionate 0.05 % lotion APPLY TOPICALLY 2 (TWO) TIMES DAILY. STOP USE X 1 WEEK AFTER EVERY 2 WEEKS OF DAILY USE. 02/11/16  Yes Chelle Jeffery, PA-C  ketoconazole (NIZORAL) 2 % shampoo APPLY 1 APPLICATION TOPICALLY 2 (TWO) TIMES A WEEK. 02/11/16  Yes Chelle Jeffery, PA-C  lisinopril (PRINIVIL,ZESTRIL) 5 MG tablet Take 1 tablet (5 mg total) by mouth daily. 07/17/15  Yes Chelle Jeffery, PA-C  pantoprazole (PROTONIX) 40 MG tablet TAKE 1 TABLET (40 MG TOTAL) BY  MOUTH DAILY. 02/23/16  Yes Chelle Jeffery, PA-C  triamcinolone cream (KENALOG) 0.1 % Apply 1 application topically 2 (two) times daily. Patient not taking: Reported on 04/02/2016 05/01/15   Porfirio Oarhelle Jeffery, PA-C     Allergies  Allergen Reactions  . Aspirin Hives      Objective:  Physical Exam  Constitutional: He is oriented to person, place, and time. He appears well-developed and well-nourished. He is active.  Blood pressure (!) 146/86, pulse 81, temperature 98.4 F (36.9 C), temperature source Oral, resp. rate 18, height 6' 0.5" (1.842 m), weight 227 lb 3.2 oz (103.1 kg), SpO2 98 %.  Eyes: Conjunctivae and EOM are normal. Pupils are equal, round, and reactive to light.  Neck: Trachea normal and normal range of motion. Neck supple. Normal carotid pulses and no JVD present. Carotid bruit is not present.  Cardiovascular: Normal rate, regular rhythm, normal heart sounds and normal pulses.   Pulmonary/Chest: Effort normal and breath sounds normal.  Neurological: He is alert and oriented to person, place, and time.  Skin: Skin is warm and dry.  Vitals reviewed.    Assessment & Plan:  1. Essential hypertension Blood pressure continues to be consistently elevated despite initial treatment.  Elevating lisinopril to help reduce BP until he can modify lifestyle and sleep better.  - lisinopril (PRINIVIL,ZESTRIL) 10 MG tablet; Take 1 tablet (10 mg total) by mouth daily.  Dispense: 90 tablet; Refill: 0  2.  Need for influenza vaccination - Flu Vaccine QUAD 36+ mos IM - Care order/instruction:

## 2016-04-02 NOTE — Patient Instructions (Addendum)
INCREASE the lisinopril from 5 mg to 10 mg. You can use up the 5 mg tablets that you have by taking 2 of them together each day.  Continue the amlodipine 10 mg.    IF you received an x-ray today, you will receive an invoice from [facility]. Please contact [facility] at [phone] with questions or concerns regarding your invoice.   IF you received labwork today, you will receive an invoice from [facility]. Please contact [facility] at [phone] with questions or concerns regarding your invoice.   Our billing staff will not be able to assist you with questions regarding bills from these companies.  You will be contacted with the lab results as soon as they are available. The fastest way to get your results is to activate your My Chart account. Instructions are located on the last page of this paperwork. If you have not heard from us regarding the results in 2 weeks, please contact this office.

## 2016-04-04 ENCOUNTER — Encounter: Payer: Self-pay | Admitting: Physician Assistant

## 2016-05-20 ENCOUNTER — Ambulatory Visit: Payer: Commercial Managed Care - HMO | Admitting: Physician Assistant

## 2016-05-21 ENCOUNTER — Ambulatory Visit (INDEPENDENT_AMBULATORY_CARE_PROVIDER_SITE_OTHER): Payer: Commercial Managed Care - HMO | Admitting: Physician Assistant

## 2016-05-21 ENCOUNTER — Other Ambulatory Visit: Payer: Self-pay | Admitting: Physician Assistant

## 2016-05-21 VITALS — BP 138/90 | HR 92 | Temp 98.3°F | Resp 16 | Ht 72.5 in | Wt 225.0 lb

## 2016-05-21 DIAGNOSIS — I10 Essential (primary) hypertension: Secondary | ICD-10-CM

## 2016-05-21 DIAGNOSIS — Z23 Encounter for immunization: Secondary | ICD-10-CM

## 2016-05-21 DIAGNOSIS — T148XXA Other injury of unspecified body region, initial encounter: Secondary | ICD-10-CM | POA: Diagnosis not present

## 2016-05-21 DIAGNOSIS — Z87891 Personal history of nicotine dependence: Secondary | ICD-10-CM | POA: Insufficient documentation

## 2016-05-21 DIAGNOSIS — J3089 Other allergic rhinitis: Secondary | ICD-10-CM

## 2016-05-21 DIAGNOSIS — F17200 Nicotine dependence, unspecified, uncomplicated: Secondary | ICD-10-CM

## 2016-05-21 DIAGNOSIS — R12 Heartburn: Secondary | ICD-10-CM

## 2016-05-21 DIAGNOSIS — J309 Allergic rhinitis, unspecified: Secondary | ICD-10-CM | POA: Insufficient documentation

## 2016-05-21 MED ORDER — FLUTICASONE PROPIONATE 50 MCG/ACT NA SUSP: 2.0000 | Freq: Every day | NASAL | 12 refills | Status: DC

## 2016-05-21 MED ORDER — LISINOPRIL 10 MG PO TABS: 10.0000 mg | ORAL_TABLET | Freq: Every day | ORAL | 2 refills | Status: DC
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
## 2016-05-21 NOTE — Assessment & Plan Note (Signed)
Continue cetirizine and PRN azelastine. ADD Flonase NS.

## 2016-05-21 NOTE — Patient Instructions (Addendum)
Get a blood pressure cuff and check your pressure at home twice a week. Write down the results and bring them with you when you come to see me in 12 weeks.  Leave the wound open tot he air as much as possible. If you need to cover it at night, use the silver sulfasalazine under the bandage, and then remove the bandage in the morning.  Continue the Zyrtec. Use the azelastine when you need it. Use the new nasal spray EVERY DAY.    IF you received an x-ray today, you will receive an invoice from [facility]. Please contact [facility] at [phone] with questions or concerns regarding your invoice.   IF you received labwork today, you will receive an invoice from [facility]. Please contact [facility] at [phone] with questions or concerns regarding your invoice.   Our billing staff will not be able to assist you with questions regarding bills from these companies.  You will be contacted with the lab results as soon as they are available. The fastest way to get your results is to activate your My Chart account. Instructions are located on the last page of this paperwork. If you have not heard from [place] regarding the results in 2 weeks, please contact this office.    Did you know that you begin to benefit from quitting smoking within the first twenty minutes? It's TRUE.  At 20 minutes: -blood pressure decreases -pulse rate drops -body temperature of hands and feet increases  At 8 hours: -carbon monoxide level in blood drops to normal -oxygen level in blood increases to normal  At 24 hours: -the chance of heart attack decreases  At 48 hours: -nerve endings start regrowing -ability to smell and taste is enhanced  2 weeks-3 months: -circulation improves -walking becomes easier -lung function improves  1-9 months: -coughing, sinus congestion, fatigue and shortness of breath decreases  1 year: -excess risk of heart disease is decreased to HALF that of a  smoker  5 years: Stroke risk is reduced to that of people who have never smoked  10 years: -risk of lung cancer drops to as little as half that of continuing smokers -risk of cancer of the mouth, throat, esophagus, bladder, kidney and pancreas decreases -risk of ulcer decreases  15 years -risk of heart disease is now similar to that of people who have never smoked -risk of death returns to nearly the level of people who have never smoked

## 2016-05-21 NOTE — Assessment & Plan Note (Signed)
Asked him to get a home BP cuff and record his BP twice weekly. Suspect home BP is normal. Continue healthy lifestyle changes.

## 2016-05-21 NOTE — Assessment & Plan Note (Signed)
Encouraged him to remain a non-smoker.

## 2016-05-22 LAB — COMPREHENSIVE METABOLIC PANEL
ALK PHOS: 114 IU/L (ref 39–117)
ALT: 28 IU/L (ref 0–44)
AST: 23 IU/L (ref 0–40)
Albumin/Globulin Ratio: 1.3 (ref 1.2–2.2)
Albumin: 4.2 g/dL (ref 3.5–5.5)
BILIRUBIN TOTAL: 0.4 mg/dL (ref 0.0–1.2)
BUN/Creatinine Ratio: 9 (ref 9–20)
BUN: 8 mg/dL (ref 6–20)
CO2: 27 mmol/L (ref 18–29)
Calcium: 9.8 mg/dL (ref 8.7–10.2)
Chloride: 102 mmol/L (ref 96–106)
Creatinine, Ser: 0.94 mg/dL (ref 0.76–1.27)
GFR calc Af Amer: 122 mL/min/{1.73_m2} (ref >59)
GFR calc non Af Amer: 105 mL/min/{1.73_m2} (ref >59)
GLUCOSE: 78 mg/dL (ref 65–99)
Globulin, Total: 3.2 g/dL (ref 1.5–4.5)
Potassium: 4.1 mmol/L (ref 3.5–5.2)
Sodium: 143 mmol/L (ref 134–144)
Total Protein: 7.4 g/dL (ref 6.0–8.5)

## 2016-07-04 ENCOUNTER — Other Ambulatory Visit: Payer: Self-pay | Admitting: Physician Assistant

## 2016-07-04 DIAGNOSIS — L210 Seborrhea capitis: Secondary | ICD-10-CM

## 2016-07-21 ENCOUNTER — Other Ambulatory Visit: Payer: Self-pay | Admitting: Physician Assistant

## 2016-07-21 DIAGNOSIS — I10 Essential (primary) hypertension: Secondary | ICD-10-CM

## 2016-09-05 ENCOUNTER — Other Ambulatory Visit: Payer: Self-pay | Admitting: Physician Assistant

## 2016-09-05 DIAGNOSIS — R12 Heartburn: Secondary | ICD-10-CM

## 2016-10-04 ENCOUNTER — Other Ambulatory Visit: Payer: Self-pay | Admitting: Physician Assistant

## 2016-10-04 DIAGNOSIS — R12 Heartburn: Secondary | ICD-10-CM

## 2016-10-20 ENCOUNTER — Other Ambulatory Visit: Payer: Self-pay | Admitting: Physician Assistant

## 2016-10-20 DIAGNOSIS — I10 Essential (primary) hypertension: Secondary | ICD-10-CM

## 2016-10-22 ENCOUNTER — Ambulatory Visit: Payer: Commercial Managed Care - HMO | Admitting: Physician Assistant

## 2016-10-22 DIAGNOSIS — L400 Psoriasis vulgaris: Secondary | ICD-10-CM | POA: Diagnosis not present

## 2016-10-24 ENCOUNTER — Ambulatory Visit: Payer: Commercial Managed Care - HMO | Admitting: Physician Assistant

## 2016-10-28 ENCOUNTER — Ambulatory Visit (INDEPENDENT_AMBULATORY_CARE_PROVIDER_SITE_OTHER): Payer: 59 | Admitting: Physician Assistant

## 2016-10-28 ENCOUNTER — Encounter: Payer: Self-pay | Admitting: Physician Assistant

## 2016-10-28 VITALS — BP 137/94 | HR 74 | Temp 97.9°F | Resp 18 | Ht 72.5 in | Wt 230.2 lb

## 2016-10-28 DIAGNOSIS — B360 Pityriasis versicolor: Secondary | ICD-10-CM | POA: Insufficient documentation

## 2016-10-28 DIAGNOSIS — R12 Heartburn: Secondary | ICD-10-CM | POA: Diagnosis not present

## 2016-10-28 DIAGNOSIS — Z23 Encounter for immunization: Secondary | ICD-10-CM

## 2016-10-28 DIAGNOSIS — F17200 Nicotine dependence, unspecified, uncomplicated: Secondary | ICD-10-CM | POA: Diagnosis not present

## 2016-10-28 DIAGNOSIS — I10 Essential (primary) hypertension: Secondary | ICD-10-CM | POA: Diagnosis not present

## 2016-10-28 MED ORDER — AMLODIPINE BESYLATE 10 MG PO TABS: 10.0000 mg | ORAL_TABLET | Freq: Every day | ORAL | 1 refills | Status: DC

## 2016-10-28 MED ORDER — PANTOPRAZOLE SODIUM 40 MG PO TBEC: 40.0000 mg | DELAYED_RELEASE_TABLET | Freq: Every day | ORAL | 1 refills | Status: DC

## 2016-10-28 NOTE — Assessment & Plan Note (Signed)
Has quit smoking again

## 2016-10-28 NOTE — Patient Instructions (Addendum)
Congratulations on quitting smoking!  Call the dermatologist to ask about an alternative to the expensive shampoo, and ask to ask if they want you to keep using the steroid spray.    IF you received an x-ray today, you will receive an invoice from [facility]. Please contact [facility] Radiology at [phone] with questions or concerns regarding your invoice.   IF you received labwork today, you will receive an invoice from [facility]. Please contact [facility] at [phone] with questions or concerns regarding your invoice.   Our billing staff will not be able to assist you with questions regarding bills from these companies.  You will be contacted with the lab results as soon as they are available. The fastest way to get your results is to activate your My Chart account. Instructions are located on the last page of this paperwork. If you have not heard from [place] regarding the results in 2 weeks, please contact this office.

## 2016-10-28 NOTE — Progress Notes (Signed)
Patient ID: Eugene Gomez, male    DOB: [birth year], 36 y.o.   MRN: [id number]  PCP: Porfirio Oar, PA-C  Chief Complaint  Patient presents with  . Medication Refill    Lisinopril 10 MG, Amlodipine 10 MG, Pt states he ran out of meds 3 days ago and that is why his BP is up today.     Subjective:   Presents for medication refills. Unfortunately, he ran out of amlodipine 3 days ago.  He's been tolerating his meds well, some intermittent LE edema, no cough/globus sensation. Pantoprazole is helping his heartburn. He has cut back on fried foods, trying to increase fruits and veggies. He works 2 jobs, so eats when he can, often skipping lunch.    Review of Systems Some intermittent bilateral shoulder pain, occasional headache. No CP, SOB, fever, chills, visual changes. No palpitations. No GI/GU symptoms.    Patient Active Problem List   Diagnosis Date Noted  . Smoker 05/21/2016  . Allergic rhinitis 05/21/2016  . BMI 30.0-30.9,adult 07/17/2015  . Hepatitis B immune 10/26/2014  . Seborrhea capitis 08/24/2014  . Folliculitis 08/24/2014  . Excessive sweating 08/24/2014  . Acute meniscal tear of left knee 08/24/2014  . Left shoulder pain 08/24/2014  . Hypertension 07/02/2011     Prior to Admission medications   Medication Sig Start Date End Date Taking? Authorizing Provider  amLODipine (NORVASC) 10 MG tablet TAKE 1 TABLET (10 MG TOTAL) BY MOUTH DAILY. 07/21/16  Yes Shantaya Bluestone, PA-C  azelastine (ASTELIN) 0.1 % nasal spray Place 2 sprays into both nostrils 2 (two) times daily. 08/29/15  Yes Linnette Panella, PA-C  betamethasone dipropionate 0.05 % lotion APPLY TOPICALLY 2 (TWO) TIMES DAILY. STOP USE X 1 WEEK AFTER EVERY 2 WEEKS OF DAILY USE. 07/06/16  Yes Christine Schiefelbein, PA-C  fluticasone (FLONASE) 50 MCG/ACT nasal spray Place 2 sprays into both nostrils daily. 05/21/16  Yes Napolean Sia, PA-C  ketoconazole (NIZORAL) 2 % shampoo APPLY 1 APPLICATION TOPICALLY 2 (TWO)  TIMES A WEEK. 02/11/16  Yes Shontez Sermon, PA-C  lisinopril (PRINIVIL,ZESTRIL) 10 MG tablet Take 1 tablet (10 mg total) by mouth daily. 05/21/16  Yes Payge Eppes, PA-C  pantoprazole (PROTONIX) 40 MG tablet TAKE 1 TABLET BY MOUTH EVERY DAY 10/06/16  Yes Pantera Winterrowd, PA-C  triamcinolone cream (KENALOG) 0.1 % Apply 1 application topically 2 (two) times daily. Patient not taking: Reported on 04/02/2016 05/01/15   Porfirio Oar, PA-C     Allergies  Allergen Reactions  . Aspirin Hives       Objective:  Physical Exam  Constitutional: He is oriented to person, place, and time. He appears well-developed and well-nourished. He is active and cooperative. No distress.  BP (!) 137/94 (BP Location: Right Arm, Patient Position: Sitting, Cuff Size: Large)   Pulse 74   Temp 97.9 F (36.6 C) (Oral)   Resp 18   Ht 6' 0.5" (1.842 m)   Wt 230 lb 3.2 oz (104.4 kg)   SpO2 98%   BMI 30.79 kg/m   HENT:  Head: Normocephalic and atraumatic.  Right Ear: Hearing normal.  Left Ear: Hearing normal.  Eyes: Conjunctivae are normal. No scleral icterus.  Neck: Normal range of motion. Neck supple. No thyromegaly present.  Cardiovascular: Normal rate, regular rhythm and normal heart sounds.   Pulses:      Radial pulses are 2+ on the right side, and 2+ on the left side.  Pulmonary/Chest: Effort normal and breath sounds normal.  Lymphadenopathy:  Head (right side): No tonsillar, no preauricular, no posterior auricular and no occipital adenopathy present.       Head (left side): No tonsillar, no preauricular, no posterior auricular and no occipital adenopathy present.    He has no cervical adenopathy.       Right: No supraclavicular adenopathy present.       Left: No supraclavicular adenopathy present.  Neurological: He is alert and oriented to person, place, and time. No sensory deficit.  Skin: Skin is warm, dry and intact. No rash noted. No cyanosis or erythema. Nails show no clubbing.    Psychiatric: He has a normal mood and affect. His speech is normal and behavior is normal.           Assessment & Plan:   Problem List Items Addressed This Visit    Hypertension - Primary    Slightly elevated today, likely due to running out of amlodipine. Resume amlodipine and continue lisinopril. Encouraged healthy lifestyle changes.      Relevant Medications   amLODipine (NORVASC) 10 MG tablet   Other Relevant Orders   CBC with Differential/Platelet (Completed)   Comprehensive metabolic panel (Completed)   Smoker    Has quit smoking again       Other Visit Diagnoses    Heartburn       Relevant Medications   pantoprazole (PROTONIX) 40 MG tablet   Need for influenza vaccination       Relevant Orders   Flu Vaccine QUAD 36+ mos IM (Completed)       Return in about 6 months (around 04/27/2017) for re-evaluation of blood pressure.   Fernande Bras, PA-C Primary Care at [facility]

## 2016-10-29 LAB — CBC WITH DIFFERENTIAL/PLATELET
BASOS ABS: 0 10*3/uL (ref 0.0–0.2)
Basos: 0 %
EOS (ABSOLUTE): 0.1 10*3/uL (ref 0.0–0.4)
EOS: 2 %
HEMATOCRIT: 43.7 % (ref 37.5–51.0)
Hemoglobin: 14.7 g/dL (ref 13.0–17.7)
IMMATURE GRANULOCYTES: 0 %
Immature Grans (Abs): 0 10*3/uL (ref 0.0–0.1)
Lymphocytes Absolute: 2.7 10*3/uL (ref 0.7–3.1)
Lymphs: 40 %
MCH: 25.3 pg — ABNORMAL LOW (ref 26.6–33.0)
MCHC: 33.6 g/dL (ref 31.5–35.7)
MCV: 75 fL — ABNORMAL LOW (ref 79–97)
MONOS ABS: 0.6 10*3/uL (ref 0.1–0.9)
Monocytes: 9 %
NEUTROS PCT: 49 %
Neutrophils Absolute: 3.3 10*3/uL (ref 1.4–7.0)
PLATELETS: 351 10*3/uL (ref 150–379)
RBC: 5.8 x10E6/uL (ref 4.14–5.80)
RDW: 15.3 % (ref 12.3–15.4)
WBC: 6.8 10*3/uL (ref 3.4–10.8)

## 2016-10-29 LAB — COMPREHENSIVE METABOLIC PANEL
ALT: 21 IU/L (ref 0–44)
AST: 18 IU/L (ref 0–40)
Albumin/Globulin Ratio: 1.4 (ref 1.2–2.2)
Albumin: 4.6 g/dL (ref 3.5–5.5)
Alkaline Phosphatase: 133 IU/L — ABNORMAL HIGH (ref 39–117)
BUN/Creatinine Ratio: 8 — ABNORMAL LOW (ref 9–20)
BUN: 9 mg/dL (ref 6–20)
Bilirubin Total: <0.2 mg/dL (ref 0.0–1.2)
CALCIUM: 10.1 mg/dL (ref 8.7–10.2)
CO2: 23 mmol/L (ref 20–29)
CREATININE: 1.18 mg/dL (ref 0.76–1.27)
Chloride: 104 mmol/L (ref 96–106)
GFR calc Af Amer: 92 mL/min/{1.73_m2} (ref >59)
GFR, EST NON AFRICAN AMERICAN: 79 mL/min/{1.73_m2} (ref >59)
GLUCOSE: 97 mg/dL (ref 65–99)
Globulin, Total: 3.4 g/dL (ref 1.5–4.5)
Potassium: 4.1 mmol/L (ref 3.5–5.2)
SODIUM: 143 mmol/L (ref 134–144)
Total Protein: 8 g/dL (ref 6.0–8.5)

## 2016-11-02 DIAGNOSIS — R12 Heartburn: Secondary | ICD-10-CM | POA: Insufficient documentation

## 2016-11-02 NOTE — Assessment & Plan Note (Signed)
Slightly elevated today, likely due to running out of amlodipine. Resume amlodipine and continue lisinopril. Encouraged healthy lifestyle changes.

## 2017-02-17 ENCOUNTER — Other Ambulatory Visit: Payer: Self-pay | Admitting: Physician Assistant

## 2017-02-17 DIAGNOSIS — L210 Seborrhea capitis: Secondary | ICD-10-CM

## 2017-05-01 ENCOUNTER — Ambulatory Visit: Payer: 59 | Admitting: Physician Assistant

## 2017-05-05 ENCOUNTER — Ambulatory Visit: Payer: 59 | Admitting: Physician Assistant

## 2017-05-05 ENCOUNTER — Encounter: Payer: Self-pay | Admitting: Physician Assistant

## 2017-05-05 ENCOUNTER — Other Ambulatory Visit: Payer: Self-pay

## 2017-05-05 VITALS — BP 130/80 | HR 86 | Temp 98.6°F | Resp 16 | Ht 72.5 in | Wt 240.2 lb

## 2017-05-05 DIAGNOSIS — I10 Essential (primary) hypertension: Secondary | ICD-10-CM | POA: Diagnosis not present

## 2017-05-05 DIAGNOSIS — Z6830 Body mass index (BMI) 30.0-30.9, adult: Secondary | ICD-10-CM | POA: Diagnosis not present

## 2017-05-05 DIAGNOSIS — J3089 Other allergic rhinitis: Secondary | ICD-10-CM | POA: Diagnosis not present

## 2017-05-05 DIAGNOSIS — Z87891 Personal history of nicotine dependence: Secondary | ICD-10-CM

## 2017-05-05 MED ORDER — AMLODIPINE BESYLATE 5 MG PO TABS: 5.0000 mg | ORAL_TABLET | Freq: Every day | ORAL | 3 refills | Status: DC

## 2017-05-05 MED ORDER — LISINOPRIL 20 MG PO TABS: 20.0000 mg | ORAL_TABLET | Freq: Every day | ORAL | 3 refills | Status: DC

## 2017-05-05 MED ORDER — FLUTICASONE PROPIONATE 50 MCG/ACT NA SUSP: 2.0000 | Freq: Every day | NASAL | 12 refills | Status: DC

## 2017-05-05 NOTE — Progress Notes (Signed)
Patient ID: Eugene Gomez, male    DOB: 12-07-1980, 37 y.o.   MRN: [id number]  PCP: Porfirio OarJeffery, Adriyanna Christians, PA-C  Chief Complaint  Patient presents with  . Hypertension    follow up     Subjective:   Presents for evaluation of HTN.  Reports that in general, he's doing well. 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 He likes that he's staying active, but notes that he's still heavier than he wants to be. He's lost 5 lbs recently, but remains heavier than he was at his last visit here 6 months ago. . Last labs 10/28/2016: CMET and CBC essentially normal. No lipids on file  Continues to experience swelling of the ankles. Sometimes also in his hands. Drinking 64 ounces of water daily. Isn't sure what salt he's consuming.  LEFT arm numbness intermittently. Often occurs with using the joystick in the truck, or when sitting in a weird position. Can experience the same tingling feeling in the hands.  Review of Systems As above.    Patient Active Problem List   Diagnosis Date Noted  . Heartburn 11/02/2016  . Tinea versicolor 10/28/2016  . Smoker 05/21/2016  . Allergic rhinitis 05/21/2016  . BMI 30.0-30.9,adult 07/17/2015  . Hepatitis B immune 10/26/2014  . Seborrhea capitis 08/24/2014  . Folliculitis 08/24/2014  . Excessive sweating 08/24/2014  . Acute meniscal tear of left knee 08/24/2014  . Left shoulder pain 08/24/2014  . Hypertension 07/02/2011     Prior to Admission medications   Medication Sig Start Date End Date Taking? Authorizing Provider  amLODipine (NORVASC) 10 MG tablet Take 1 tablet (10 mg total) by mouth daily. 10/28/16  Yes Annastyn Silvey, PA-C  betamethasone dipropionate 0.05 % lotion APPLY TOPICALLY 2 (TWO) TIMES DAILY. STOP USE X 1 WEEK AFTER EVERY 2 WEEKS OF DAILY USE. 02/18/17  Yes Auda Finfrock, PA-C  fluticasone (FLONASE) 50 MCG/ACT nasal spray Place 2 sprays into both  nostrils daily. 05/21/16  Yes Katrell Milhorn, PA-C  ketoconazole (NIZORAL) 2 % shampoo APPLY 1 APPLICATION TOPICALLY 2 (TWO) TIMES A WEEK. 02/11/16  Yes Naveah Brave, PA-C  lisinopril (PRINIVIL,ZESTRIL) 10 MG tablet Take 1 tablet (10 mg total) by mouth daily. 05/21/16  Yes Kattie Santoyo, PA-C  pantoprazole (PROTONIX) 40 MG tablet Take 1 tablet (40 mg total) by mouth daily. 10/28/16  Yes Leontae Bostock, PA-C  azelastine (ASTELIN) 0.1 % nasal spray Place 2 sprays into both nostrils 2 (two) times daily. Patient not taking: Reported on 05/05/2017 08/29/15   Porfirio OarJeffery, Asher Torpey, PA-C     Allergies  Allergen Reactions  . Aspirin Hives       Objective:  Physical Exam  Constitutional: He is oriented to person, place, and time. He appears well-developed and well-nourished. He is active and cooperative. No distress.  BP 130/80   Pulse 86   Temp 98.6 F (37 C)   Resp 16   Ht 6' 0.5" (1.842 m)   Wt 240 lb 3.2 oz (109 kg)   SpO2 98%   BMI 32.13 kg/m   HENT:  Head: Normocephalic and atraumatic.  Right Ear: Hearing normal.  Left Ear: Hearing normal.  Eyes: Conjunctivae are normal. No scleral icterus.  Neck: Normal range of motion. Neck supple. No thyromegaly present.  Cardiovascular: Normal rate, regular rhythm and normal heart sounds.  Pulses:      Radial pulses are 2+ on the right side, and 2+ on the  left side.  Pulmonary/Chest: Effort normal and breath sounds normal.  Lymphadenopathy:       Head (right side): No tonsillar, no preauricular, no posterior auricular and no occipital adenopathy present.       Head (left side): No tonsillar, no preauricular, no posterior auricular and no occipital adenopathy present.    He has no cervical adenopathy.       Right: No supraclavicular adenopathy present.       Left: No supraclavicular adenopathy present.  Neurological: He is alert and oriented to person, place, and time. No sensory deficit.  Skin: Skin is warm, dry and intact. No rash noted. No  cyanosis or erythema. Nails show no clubbing.  Psychiatric: He has a normal mood and affect. His speech is normal and behavior is normal.       Wt Readings from Last 3 Encounters:  05/05/17 240 lb 3.2 oz (109 kg)  10/28/16 230 lb 3.2 oz (104.4 kg)  05/21/16 225 lb (102.1 kg)       Assessment & Plan:   Problem List Items Addressed This Visit    Hypertension - Primary    Controlled. No changes.      Relevant Medications   lisinopril (PRINIVIL,ZESTRIL) 20 MG tablet   amLODipine (NORVASC) 5 MG tablet   BMI 30.0-30.9,adult    Weight rising. Counseled on the need to increase his exercise to 150 minutes/week, in addition to what he is getting at work, and to improve his eating choices. Less fried foods, refined sugars and carbs. Increase lean protein and vegetables.      Former smoker    Remains a non-smoker, but sometimes he really wants to smoke.       Other Visit Diagnoses    Chronic non-seasonal allergic rhinitis       Relevant Medications   fluticasone (FLONASE) 50 MCG/ACT nasal spray       Return in about 6 weeks (around 06/16/2017) for re-evaluation of blood pressure and swelling, fasting labs.   Fernande Bras, PA-C Primary Care at [facility]

## 2017-05-05 NOTE — Patient Instructions (Addendum)
Minimize the salt in your diet (be careful of pre-prepared foods).  REDUCE the amlodipine from 10 mg to 5 mg. INCREASE the lisinopril from 10 mg to 20 mg.    IF you received an x-ray today, you will receive an invoice from [facility]. Please contact [facility] at [phone] with questions or concerns regarding your invoice.   IF you received labwork today, you will receive an invoice from [facility]. Please contact [facility] at [phone] with questions or concerns regarding your invoice.   Our billing staff will not be able to assist you with questions regarding bills from these companies.  You will be contacted with the lab results as soon as they are available. The fastest way to get your results is to activate your My Chart account. Instructions are located on the last page of this paperwork. If you have not heard from us regarding the results in 2 weeks, please contact this office.

## 2017-05-05 NOTE — Assessment & Plan Note (Signed)
Remains a non-smoker, but sometimes he really wants to smoke.

## 2017-05-11 NOTE — Assessment & Plan Note (Signed)
Weight rising. Counseled on the need to increase his exercise to 150 minutes/week, in addition to what he is getting at work, and to improve his eating choices. Less fried foods, refined sugars and carbs. Increase lean protein and vegetables.

## 2017-05-11 NOTE — Assessment & Plan Note (Signed)
Controlled. No changes. 

## 2017-05-14 ENCOUNTER — Encounter: Payer: Self-pay | Admitting: Physician Assistant

## 2017-05-26 ENCOUNTER — Telehealth: Payer: Self-pay | Admitting: Physician Assistant

## 2017-05-26 ENCOUNTER — Other Ambulatory Visit: Payer: Self-pay | Admitting: Physician Assistant

## 2017-05-26 DIAGNOSIS — I10 Essential (primary) hypertension: Secondary | ICD-10-CM

## 2017-05-26 NOTE — Telephone Encounter (Signed)
**  MYCHART MESSAGE SENT TO PT 05/26/17 LETTING THEM KNOW THEY NEED TO RESCHEDULE APT FOR 06/17/17 DUE TO CHELLE BEING OUT OF THE OFFICE**CC °

## 2017-06-05 ENCOUNTER — Other Ambulatory Visit: Payer: Self-pay | Admitting: Physician Assistant

## 2017-06-05 DIAGNOSIS — L210 Seborrhea capitis: Secondary | ICD-10-CM

## 2017-06-05 NOTE — Telephone Encounter (Signed)
LOV- 05/05/17 NOV- none scheduled  Betamethasone last filled 02/18/17  Keaconazole shampoo last filled 02/11/16 please advise

## 2017-06-17 ENCOUNTER — Ambulatory Visit: Payer: 59 | Admitting: Physician Assistant

## 2017-06-27 ENCOUNTER — Other Ambulatory Visit: Payer: Self-pay | Admitting: Physician Assistant

## 2017-06-27 DIAGNOSIS — L210 Seborrhea capitis: Secondary | ICD-10-CM

## 2017-07-21 ENCOUNTER — Other Ambulatory Visit: Payer: Self-pay | Admitting: Physician Assistant

## 2017-07-21 DIAGNOSIS — R12 Heartburn: Secondary | ICD-10-CM

## 2017-07-29 ENCOUNTER — Other Ambulatory Visit: Payer: Self-pay

## 2017-07-29 ENCOUNTER — Other Ambulatory Visit: Payer: Self-pay | Admitting: Family Medicine

## 2017-07-29 ENCOUNTER — Ambulatory Visit: Payer: 59 | Admitting: Family Medicine

## 2017-07-29 ENCOUNTER — Encounter: Payer: Self-pay | Admitting: Family Medicine

## 2017-07-29 VITALS — BP 148/80 | HR 79 | Temp 98.8°F | Resp 16 | Ht 72.05 in | Wt 234.2 lb

## 2017-07-29 DIAGNOSIS — Z13220 Encounter for screening for lipoid disorders: Secondary | ICD-10-CM | POA: Diagnosis not present

## 2017-07-29 DIAGNOSIS — R609 Edema, unspecified: Secondary | ICD-10-CM | POA: Diagnosis not present

## 2017-07-29 DIAGNOSIS — I10 Essential (primary) hypertension: Secondary | ICD-10-CM

## 2017-07-29 DIAGNOSIS — G44019 Episodic cluster headache, not intractable: Secondary | ICD-10-CM | POA: Diagnosis not present

## 2017-07-29 DIAGNOSIS — R5382 Chronic fatigue, unspecified: Secondary | ICD-10-CM | POA: Diagnosis not present

## 2017-07-29 DIAGNOSIS — Z131 Encounter for screening for diabetes mellitus: Secondary | ICD-10-CM

## 2017-07-29 DIAGNOSIS — R12 Heartburn: Secondary | ICD-10-CM

## 2017-07-29 DIAGNOSIS — Z5181 Encounter for therapeutic drug level monitoring: Secondary | ICD-10-CM

## 2017-07-29 MED ORDER — LISINOPRIL-HYDROCHLOROTHIAZIDE 10-12.5 MG PO TABS: 1.0000 | ORAL_TABLET | Freq: Every day | ORAL | 0 refills | Status: DC

## 2017-07-29 NOTE — Patient Instructions (Addendum)
Polysporin to the infected hair follicle      IF you received an x-ray today, you will receive an invoice from [facility]. Please contact [facility] at [phone] with questions or concerns regarding your invoice.   IF you received labwork today, you will receive an invoice from [facility]. Please contact [facility] at [phone] with questions or concerns regarding your invoice.   Our billing staff will not be able to assist you with questions regarding bills from these companies.  You will be contacted with the lab results as soon as they are available. The fastest way to get your results is to activate your My Chart account. Instructions are located on the last page of this paperwork. If you have not heard from [place] regarding the results in 2 weeks, please contact this office.     Cluster Headache A cluster headache is a type of headache that causes deep, intense head pain. Cluster headaches can last from 15 minutes to 3 hours. They usually occur:  On one side of the head. They may occur on the other side when a new cluster of headaches begins.  Repeatedly over weeks to months.  Several times a day.  At the same time of day, often at night.  More often in the fall and springtime.  What are the causes? The cause of this condition is not known. What increases the risk? This condition is more likely to develop in:  Males.  People who drink alcohol.  People who smoke or use products that contain nicotine or tobacco.  People who take medicines that cause blood vessels to expand, such as nitroglycerin.  People who take antihistamines.  What are the signs or symptoms? Symptoms of this condition include:  Severe pain on one side of the head that begins behind or around your eye or temple.  Pain on one side of the head.  Nausea.  Sensitivity to light.  Runny nose and nasal stuffiness.  Sweaty, pale skin on the face.  Droopy or swollen eyelid, eye  redness, or tearing.  Restlessness and agitation.  How is this diagnosed? This condition may be diagnosed based on:  Your symptoms.  A physical exam.  Your health care provider may order tests to see if your headaches are caused by another medical condition. These tests may show that you do not have cluster headaches. Tests may include:  A CT scan of your head.  An MRI of your head.  Lab tests.  How is this treated? This condition may be treated with:  Medicines to relieve pain and to prevent repeated (recurrent) attacks. Some people may need a combination of medicines.  Oxygen. This helps to relieve pain.  Follow these instructions at home: Headache diary Keep a headache diary as told by your health care provider. Doing this can help you and your health care provider figure out what triggers your headaches. In your headache diary, include information about:  The time of day that your headache started and what you were doing when it began.  How long your headache lasted.  Where your pain started and whether it moved to other areas.  The type of pain, such as burning, stabbing, throbbing, or cramping.  Your level of pain. Use a pain scale and rate the pain with a number from 1 (mild) up to 10 (severe).  The treatment that you used, and any change in symptoms after treatment.  Medicines  Take over-the-counter and prescription medicines only as told by your health  care provider.  Do not drive or use heavy machinery while taking prescription pain medicine.  Use oxygen as told by your health care provider. Lifestyle  Follow a regular sleep schedule. Do not vary the time that you go to bed or the amount that you sleep from day to day. It is important to stay on the same schedule during a cluster period to help prevent headaches.  Exercise regularly.  Eat a healthy diet and avoid foods that may trigger your headaches.  Avoid alcohol.  Do not use any products that  contain nicotine or tobacco, such as cigarettes and e-cigarettes. If you need help quitting, ask your health care provider. Contact a health care provider if:  Your headaches change, become more severe, or occur more often.  The medicine or oxygen that your health care provider recommended does not help. Get help right away if:  You faint.  You have weakness or numbness, especially on one side of your body or face.  You have double vision.  You have nausea or vomiting that does not go away within several hours.  You have trouble talking, walking, or keeping your balance.  You have pain or stiffness in your neck.  You have a fever. Summary  A cluster headache is a type of headache that causes deep, intense head pain, usually on one side of the head.  Keep a headache diary to help discover what triggers your headaches.  A regular sleep schedule can help prevent headaches. This information is not intended to replace advice given to you by your health care provider. Make sure you discuss any questions you have with your health care provider. Document Released: 01/27/2005 Document Revised: 10/09/2015 Document Reviewed: 10/09/2015 Elsevier Interactive Patient Education  [employer]2018 [employer]

## 2017-07-29 NOTE — Progress Notes (Signed)
Chief Complaint  Patient presents with  . Establish Care    medication f/u  and refills on all meds, per pt wife wants pt off lisinopril due to side effects  . boil in groin area  . Fatigue    HPI    Hypertension: Patient here for follow-up of elevated blood pressure. He is not exercising and is adherent to low salt diet. Cardiac symptoms none. Patient denies chest pain, chest pressure/discomfort, claudication, exertional chest pressure/discomfort, fatigue, irregular heart beat, lower extremity edema, near-syncope and orthopnea.  Cardiovascular risk factors: hypertension, male gender and sedentary lifestyle. Use of agents associated with hypertension: none. History of target organ damage: none. Lab Results  Component Value Date   CREATININE 1.18 10/28/2016   BP Readings from Last 3 Encounters:  07/29/17 (!) 148/80  05/05/17 130/80  10/28/16 (!) 137/94   He reports that the mid calf to the ankle on the left leg is swollen by the end of the day He reports that this morning and most morning his swelling is absent He reports that his wife told him to come in to talk about the side effects of his lisinopril which she thinks is causing the swelling.   Acute Headache He reports a 4 days history of headaches States that he took tylenol  Reports that he took acetaminophen 1000mg  and the headache resolved States that yesterday after coming home from work he had a frontal headache States that he did not have any vision changes states that he wears contact He denies sinus congestion but reports that he can taste some mucus He states that he felt that his headaches was continuous He reports that he has not had headaches since college sophomore year There is not photophobia or phonophobia He reports that he would characterize the headache as occasionally dull or like a hammer hitting The pain is 6/10   Bump in the groin area Reports that he keeps getting hair bumps in the groin He uses  betamethasone topically Denies any drainage Tried shaving in the past which did not help    Past Medical History:  Diagnosis Date  . Hypertension     Current Outpatient Medications  Medication Sig Dispense Refill  . betamethasone dipropionate 0.05 % lotion APPLY TOPICALLY 2 (TWO) TIMES DAILY. STOP USE X 1 WEEK AFTER EVERY 2 WEEKS OF DAILY USE. 60 mL 0  . fluticasone (FLONASE) 50 MCG/ACT nasal spray Place 2 sprays into both nostrils daily. 16 g 12  . hydrocortisone 2.5 % cream APPLY TO AFFECTED AREA TWICE A DAY AS NEEDED ON FACE  2  . ketoconazole (NIZORAL) 2 % shampoo APPLY 1 APPLICATION TOPICALLY 2 (TWO) TIMES A WEEK. 120 mL 0  . pantoprazole (PROTONIX) 40 MG tablet Take 1 tablet (40 mg total) by mouth daily. 90 tablet 1  . lisinopril-hydrochlorothiazide (PRINZIDE,ZESTORETIC) 10-12.5 MG tablet Take 1 tablet by mouth daily. 90 tablet 0   No current facility-administered medications for this visit.     Allergies:  Allergies  Allergen Reactions  . Aspirin Hives    Past Surgical History:  Procedure Laterality Date  . DENTAL SURGERY Left   . WISDOM TOOTH EXTRACTION      Social History   Socioeconomic History  . Marital status: Married    Spouse name: Therapist, occupational  . Number of children: 2  . Years of education: Associates  . Highest education level: Not on file  Occupational History  . 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 Occupation: Night Job    Comment: [employer]  Social Needs  . Financial resource strain: Not on file  . Food insecurity:    Worry: Not on file    Inability: Not on file  . Transportation needs:    Medical: Not on file    Non-medical: Not on file  Tobacco Use  . Smoking status: Former Smoker    Types: Cigars  . Smokeless tobacco: Never Used  . Tobacco comment: MAYBE 5 OR 6 DAYS  Substance and Sexual Activity  . Alcohol use: Yes    Alcohol/week: 0.6 - 6.0 oz    Types: 1 - 10  Standard drinks or equivalent per week    Comment: MIXED DRINKS  . Drug use: No  . Sexual activity: Yes    Partners: Female  Lifestyle  . Physical activity:    Days per week: Not on file    Minutes per session: Not on file  . Stress: Not on file  Relationships  . Social connections:    Talks on phone: Not on file    Gets together: Not on file    Attends religious service: Not on file    Active member of club or organization: Not on file    Attends meetings of clubs or organizations: Not on file    Relationship status: Not on file  Other Topics Concern  . Not on file  Social History Narrative   Lives with his wife and two daughters.    Family History  Problem Relation Age of Onset  . Hypertension Mother   . Anemia Mother   . Hypertension Father   . Diabetes Father   . Hypertension Brother   . Drug abuse Brother   . Cancer Sister        brain  . Hypertension Maternal Aunt   . Hypertension Maternal Uncle   . Hypertension Paternal Aunt   . Glaucoma Paternal Aunt   . Diabetes Paternal Aunt   . Hypertension Paternal Uncle   . Diabetes Paternal Uncle      ROS Review of Systems See HPI Constitution: No fevers or chills No malaise No diaphoresis Skin: No rash or itching Eyes: no blurry vision, no double vision GU: no dysuria or hematuria Neuro: no dizziness or headaches  all others reviewed and negative   Objective: Vitals:   07/29/17 0901  BP: (!) 148/80  Pulse: 79  Resp: 16  Temp: 98.8 F (37.1 C)  TempSrc: Oral  SpO2: 99%  Weight: 234 lb 3.2 oz (106.2 kg)  Height: 6' 0.05" (1.83 m)    Physical Exam  Physical Exam  Constitutional: She is oriented to person, place, and time. She appears well-developed and well-nourished.  HENT:  Head: Normocephalic and atraumatic.  Eyes: Conjunctivae and EOM are normal.  Cardiovascular: Normal rate, regular rhythm and normal heart sounds.   Pulmonary/Chest: Effort normal and breath sounds normal. No respiratory  distress. She has no wheezes.  Abdominal: Normal appearance and bowel sounds are normal. There is no tenderness. There is no CVA tenderness.  Neurological: She is alert and oriented to person, place, and time.  Skin: small bump in the hair line of the inguinal crease    Assessment and Plan Steele was seen today for establish care, boil in groin area and fatigue.  Diagnoses and all orders for this visit:  Essential hypertension-  bp not at goal but close Will get change amlodipine due to dependent edema -  lisinopril-hydrochlorothiazide (PRINZIDE,ZESTORETIC) 10-12.5 MG tablet; Take 1 tablet by mouth daily. -     Lipid panel  Dependent edema-  Discussed his medications Reviewed his diet Pt to cut back on salt Continue diuretic wit lisinopril and stop amlodipine which can have side effect of peripheral edema -     lisinopril-hydrochlorothiazide (PRINZIDE,ZESTORETIC) 10-12.5 MG tablet; Take 1 tablet by mouth daily. -     TSH -     Comprehensive metabolic panel  Episodic cluster headache, not intractable- will check for contributing factors Pt to check his bp when he gets headaches -     CBC -     TSH -     VITAMIN D 25 Hydroxy (Vit-D Deficiency, Fractures) -     Comprehensive metabolic panel  Encounter for medication monitoring  Chronic fatigue- will evaluate with labs -     CBC -     Hemoglobin A1c  Screening for diabetes mellitus -     Hemoglobin A1c  Screening, lipid -     Lipid panel  Folliculitis- resolving   Elhadji Pecore A Creta LevinStallings

## 2017-07-29 NOTE — Telephone Encounter (Signed)
Refill request for pantoprazole 40 mg #30 with 3 refills approved. Dgaddy, CMA

## 2017-07-30 LAB — COMPREHENSIVE METABOLIC PANEL
A/G RATIO: 1.4 (ref 1.2–2.2)
ALT: 31 IU/L (ref 0–44)
AST: 18 IU/L (ref 0–40)
Albumin: 4.5 g/dL (ref 3.5–5.5)
Alkaline Phosphatase: 115 IU/L (ref 39–117)
BUN/Creatinine Ratio: 7 — ABNORMAL LOW (ref 9–20)
BUN: 7 mg/dL (ref 6–20)
Bilirubin Total: 0.3 mg/dL (ref 0.0–1.2)
CALCIUM: 9.9 mg/dL (ref 8.7–10.2)
CO2: 24 mmol/L (ref 20–29)
Chloride: 104 mmol/L (ref 96–106)
Creatinine, Ser: 1.04 mg/dL (ref 0.76–1.27)
GFR calc Af Amer: 107 mL/min/{1.73_m2} (ref >59)
GFR, EST NON AFRICAN AMERICAN: 93 mL/min/{1.73_m2} (ref >59)
GLUCOSE: 86 mg/dL (ref 65–99)
Globulin, Total: 3.2 g/dL (ref 1.5–4.5)
POTASSIUM: 3.9 mmol/L (ref 3.5–5.2)
Sodium: 142 mmol/L (ref 134–144)
TOTAL PROTEIN: 7.7 g/dL (ref 6.0–8.5)

## 2017-07-30 LAB — CBC
HEMATOCRIT: 45.1 % (ref 37.5–51.0)
Hemoglobin: 14.9 g/dL (ref 13.0–17.7)
MCH: 25.3 pg — AB (ref 26.6–33.0)
MCHC: 33 g/dL (ref 31.5–35.7)
MCV: 76 fL — AB (ref 79–97)
Platelets: 334 10*3/uL (ref 150–450)
RBC: 5.9 x10E6/uL — ABNORMAL HIGH (ref 4.14–5.80)
RDW: 14.9 % (ref 12.3–15.4)
WBC: 6.2 10*3/uL (ref 3.4–10.8)

## 2017-07-30 LAB — HEMOGLOBIN A1C
Est. average glucose Bld gHb Est-mCnc: 111 mg/dL
HEMOGLOBIN A1C: 5.5 % (ref 4.8–5.6)

## 2017-07-30 LAB — VITAMIN D 25 HYDROXY (VIT D DEFICIENCY, FRACTURES): VIT D 25 HYDROXY: 13.1 ng/mL — AB (ref 30.0–100.0)

## 2017-07-30 LAB — LIPID PANEL
Chol/HDL Ratio: 3.1 ratio (ref 0.0–5.0)
Cholesterol, Total: 152 mg/dL (ref 100–199)
HDL: 49 mg/dL (ref >39)
LDL Calculated: 90 mg/dL (ref 0–99)
Triglycerides: 63 mg/dL (ref 0–149)
VLDL Cholesterol Cal: 13 mg/dL (ref 5–40)

## 2017-07-30 LAB — TSH: TSH: 0.643 u[IU]/mL (ref 0.450–4.500)

## 2017-08-05 MED ORDER — VITAMIN D (ERGOCALCIFEROL) 1.25 MG (50000 UNIT) PO CAPS: 50000.0000 [IU] | ORAL_CAPSULE | ORAL | 3 refills | Status: DC

## 2017-08-31 ENCOUNTER — Ambulatory Visit: Payer: 59 | Admitting: Family Medicine

## 2017-09-02 ENCOUNTER — Other Ambulatory Visit: Payer: Self-pay

## 2017-09-02 ENCOUNTER — Encounter: Payer: Self-pay | Admitting: Family Medicine

## 2017-09-02 ENCOUNTER — Ambulatory Visit: Payer: 59 | Admitting: Family Medicine

## 2017-09-02 VITALS — BP 138/82 | HR 72 | Temp 98.7°F | Resp 16 | Ht 72.01 in | Wt 235.4 lb

## 2017-09-02 DIAGNOSIS — I10 Essential (primary) hypertension: Secondary | ICD-10-CM | POA: Diagnosis not present

## 2017-09-02 DIAGNOSIS — Z5181 Encounter for therapeutic drug level monitoring: Secondary | ICD-10-CM

## 2017-09-02 LAB — BASIC METABOLIC PANEL
BUN/Creatinine Ratio: 8 — ABNORMAL LOW (ref 9–20)
BUN: 9 mg/dL (ref 6–20)
CALCIUM: 9.9 mg/dL (ref 8.7–10.2)
CO2: 24 mmol/L (ref 20–29)
CREATININE: 1.15 mg/dL (ref 0.76–1.27)
Chloride: 101 mmol/L (ref 96–106)
GFR calc Af Amer: 95 mL/min/{1.73_m2} (ref >59)
GFR, EST NON AFRICAN AMERICAN: 82 mL/min/{1.73_m2} (ref >59)
Glucose: 84 mg/dL (ref 65–99)
Potassium: 4.1 mmol/L (ref 3.5–5.2)
Sodium: 144 mmol/L (ref 134–144)

## 2017-09-02 NOTE — Progress Notes (Signed)
Chief Complaint  Patient presents with  . Hypertension    one month f/u    HPI  Hypertension: Patient here for follow-up of elevated blood pressure. He is exercising and is adherent to low salt diet.  Blood pressure is well controlled at home. Cardiac symptoms none. Patient denies chest pain, chest pressure/discomfort, claudication, exertional chest pressure/discomfort, irregular heart beat, lower extremity edema and near-syncope.  Cardiovascular risk factors: hypertension. Use of agents associated with hypertension: none. History of target organ damage: none. BP Readings from Last 3 Encounters:  09/02/17 138/82  07/29/17 (!) 148/80  05/05/17 130/80     Past Medical History:  Diagnosis Date  . Hypertension     Current Outpatient Medications  Medication Sig Dispense Refill  . betamethasone dipropionate 0.05 % lotion APPLY TOPICALLY 2 (TWO) TIMES DAILY. STOP USE X 1 WEEK AFTER EVERY 2 WEEKS OF DAILY USE. 60 mL 0  . fluticasone (FLONASE) 50 MCG/ACT nasal spray Place 2 sprays into both nostrils daily. 16 g 12  . hydrocortisone 2.5 % cream APPLY TO AFFECTED AREA TWICE A DAY AS NEEDED ON FACE  2  . ketoconazole (NIZORAL) 2 % shampoo APPLY 1 APPLICATION TOPICALLY 2 (TWO) TIMES A WEEK. 120 mL 0  . lisinopril-hydrochlorothiazide (PRINZIDE,ZESTORETIC) 10-12.5 MG tablet Take 1 tablet by mouth daily. 90 tablet 0  . pantoprazole (PROTONIX) 40 MG tablet TAKE 1 TABLET BY MOUTH EVERY DAY 30 tablet 3  . Vitamin D, Ergocalciferol, (DRISDOL) 50000 units CAPS capsule Take 1 capsule (50,000 Units total) by mouth every 7 (seven) days. 12 capsule 3   No current facility-administered medications for this visit.     Allergies:  Allergies  Allergen Reactions  . Aspirin Hives    Past Surgical History:  Procedure Laterality Date  . DENTAL SURGERY Left   . WISDOM TOOTH EXTRACTION      Social History   Socioeconomic History  . Marital status: Married    Spouse name: Therapist, occupationalChassity Clapp  . Number of  children: 2  . Years of education: Associates  . Highest education level: Not on file  Occupational History  . Occupation: Heavy Arts development officerquipment Operator    Employer: [employer]    Comment: (trash truck driver)  . Occupation: Night Job    Comment: [employer]  Social Needs  . Financial resource strain: Not on file  . Food insecurity:    Worry: Not on file    Inability: Not on file  . Transportation needs:    Medical: Not on file    Non-medical: Not on file  Tobacco Use  . Smoking status: Former Smoker    Types: Cigars  . Smokeless tobacco: Never Used  . Tobacco comment: MAYBE 5 OR 6 DAYS  Substance and Sexual Activity  . Alcohol use: Yes    Alcohol/week: 0.6 - 6.0 oz    Types: 1 - 10 Standard drinks or equivalent per week    Comment: MIXED DRINKS  . Drug use: No  . Sexual activity: Yes    Partners: Female  Lifestyle  . Physical activity:    Days per week: Not on file    Minutes per session: Not on file  . Stress: Not on file  Relationships  . Social connections:    Talks on phone: Not on file    Gets together: Not on file    Attends religious service: Not on file    Active member of club or organization: Not on file    Attends meetings of clubs or  organizations: Not on file    Relationship status: Not on file  Other Topics Concern  . Not on file  Social History Narrative   Lives with his wife and two daughters.    Family History  Problem Relation Age of Onset  . Hypertension Mother   . Anemia Mother   . Hypertension Father   . Diabetes Father   . Hypertension Brother   . Drug abuse Brother   . Cancer Sister        brain  . Hypertension Maternal Aunt   . Hypertension Maternal Uncle   . Hypertension Paternal Aunt   . Glaucoma Paternal Aunt   . Diabetes Paternal Aunt   . Hypertension Paternal Uncle   . Diabetes Paternal Uncle      ROS Review of Systems See HPI Constitution: No fevers or chills No malaise No diaphoresis Skin: No rash or itching Eyes:  no blurry vision, no double vision GU: no dysuria or hematuria Neuro: no dizziness or headaches all others reviewed and negative   Objective: Vitals:   09/02/17 0844  BP: 138/82  Pulse: 72  Resp: 16  Temp: 98.7 F (37.1 C)  TempSrc: Oral  SpO2: 99%  Weight: 235 lb 6.4 oz (106.8 kg)  Height: 6' 0.01" (1.829 m)    Physical Exam  Constitutional: He is oriented to person, place, and time. He appears well-developed and well-nourished.  HENT:  Head: Normocephalic and atraumatic.  Eyes: Conjunctivae and EOM are normal.  Neck: Normal range of motion. Neck supple. No thyromegaly present.  Cardiovascular: Normal rate, regular rhythm and normal heart sounds.  No murmur heard. Pulmonary/Chest: Effort normal and breath sounds normal. No stridor. No respiratory distress. He has no wheezes.  Neurological: He is alert and oriented to person, place, and time.  Skin: Skin is warm. Capillary refill takes less than 2 seconds.  Psychiatric: He has a normal mood and affect. His behavior is normal. Judgment and thought content normal.    Assessment and Plan Safir was seen today for hypertension.  Diagnoses and all orders for this visit:  Essential hypertension- bp improved on medications Discussed DASH diet Discussed renal function monitoring -     Basic metabolic panel  Encounter for medication monitoring- will check K and Creatinine -     Basic metabolic panel     Salma Walrond A Creta Levin

## 2017-09-02 NOTE — Patient Instructions (Addendum)
   IF you received an x-ray today, you will receive an invoice from [facility]. Please contact [facility] at [phone] with questions or concerns regarding your invoice.   IF you received labwork today, you will receive an invoice from [facility]. Please contact [facility] at [phone] with questions or concerns regarding your invoice.   Our billing staff will not be able to assist you with questions regarding bills from these companies.  You will be contacted with the lab results as soon as they are available. The fastest way to get your results is to activate your My Chart account. Instructions are located on the last page of this paperwork. If you have not heard from us regarding the results in 2 weeks, please contact this office.      DASH Eating Plan DASH stands for "Dietary Approaches to Stop Hypertension." The DASH eating plan is a healthy eating plan that has been shown to reduce high blood pressure (hypertension). It may also reduce your risk for type 2 diabetes, heart disease, and stroke. The DASH eating plan may also help with weight loss. What are tips for following this plan? General guidelines  Avoid eating more than 2,300 mg (milligrams) of salt (sodium) a day. If you have hypertension, you may need to reduce your sodium intake to 1,500 mg a day.  Limit alcohol intake to no more than 1 drink a day for nonpregnant women and 2 drinks a day for men. One drink equals 12 oz of beer, 5 oz of wine, or 1 oz of hard liquor.  Work with your health care provider to maintain a healthy body weight or to lose weight. Ask what an ideal weight is for you.  Get at least 30 minutes of exercise that causes your heart to beat faster (aerobic exercise) most days of the week. Activities may include walking, swimming, or biking.  Work with your health care provider or diet and nutrition specialist (dietitian) to adjust your eating plan to your individual calorie  needs. Reading food labels  Check food labels for the amount of sodium per serving. Choose foods with less than 5 percent of the Daily Value of sodium. Generally, foods with less than 300 mg of sodium per serving fit into this eating plan.  To find whole grains, look for the word "whole" as the first word in the ingredient list. Shopping  Buy products labeled as "low-sodium" or "no salt added."  Buy fresh foods. Avoid canned foods and premade or frozen meals. Cooking  Avoid adding salt when cooking. Use salt-free seasonings or herbs instead of table salt or sea salt. Check with your health care provider or pharmacist before using salt substitutes.  Do not fry foods. Cook foods using healthy methods such as baking, boiling, grilling, and broiling instead.  Cook with heart-healthy oils, such as olive, canola, soybean, or sunflower oil. Meal planning   Eat a balanced diet that includes: ? 5 or more servings of fruits and vegetables each day. At each meal, try to fill half of your plate with fruits and vegetables. ? Up to 6-8 servings of whole grains each day. ? Less than 6 oz of lean meat, poultry, or fish each day. A 3-oz serving of meat is about the same size as a deck of cards. One egg equals 1 oz. ? 2 servings of low-fat dairy each day. ? A serving of nuts, seeds, or beans 5 times each week. ? Heart-healthy fats. Healthy fats called Omega-3 fatty acids are   found in foods such as flaxseeds and coldwater fish, like sardines, salmon, and mackerel.  Limit how much you eat of the following: ? Canned or prepackaged foods. ? Food that is high in trans fat, such as fried foods. ? Food that is high in saturated fat, such as fatty meat. ? Sweets, desserts, sugary drinks, and other foods with added sugar. ? Full-fat dairy products.  Do not salt foods before eating.  Try to eat at least 2 vegetarian meals each week.  Eat more home-cooked food and less restaurant, buffet, and fast  food.  When eating at a restaurant, ask that your food be prepared with less salt or no salt, if possible. What foods are recommended? The items listed may not be a complete list. Talk with your dietitian about what dietary choices are best for you. Grains Whole-grain or whole-wheat bread. Whole-grain or whole-wheat pasta. Brown rice. Oatmeal. Quinoa. Bulgur. Whole-grain and low-sodium cereals. Pita bread. Low-fat, low-sodium crackers. Whole-wheat flour tortillas. Vegetables Fresh or frozen vegetables (raw, steamed, roasted, or grilled). Low-sodium or reduced-sodium tomato and vegetable juice. Low-sodium or reduced-sodium tomato sauce and tomato paste. Low-sodium or reduced-sodium canned vegetables. Fruits All fresh, dried, or frozen fruit. Canned fruit in natural juice (without added sugar). Meat and other protein foods Skinless chicken or turkey. Ground chicken or turkey. Pork with fat trimmed off. Fish and seafood. Egg whites. Dried beans, peas, or lentils. Unsalted nuts, nut butters, and seeds. Unsalted canned beans. Lean cuts of beef with fat trimmed off. Low-sodium, lean deli meat. Dairy Low-fat (1%) or fat-free (skim) milk. Fat-free, low-fat, or reduced-fat cheeses. Nonfat, low-sodium ricotta or cottage cheese. Low-fat or nonfat yogurt. Low-fat, low-sodium cheese. Fats and oils Soft margarine without trans fats. Vegetable oil. Low-fat, reduced-fat, or light mayonnaise and salad dressings (reduced-sodium). Canola, safflower, olive, soybean, and sunflower oils. Avocado. Seasoning and other foods Herbs. Spices. Seasoning mixes without salt. Unsalted popcorn and pretzels. Fat-free sweets. What foods are not recommended? The items listed may not be a complete list. Talk with your dietitian about what dietary choices are best for you. Grains Baked goods made with fat, such as croissants, muffins, or some breads. Dry pasta or rice meal packs. Vegetables Creamed or fried vegetables. Vegetables  in a cheese sauce. Regular canned vegetables (not low-sodium or reduced-sodium). Regular canned tomato sauce and paste (not low-sodium or reduced-sodium). Regular tomato and vegetable juice (not low-sodium or reduced-sodium). Pickles. Olives. Fruits Canned fruit in a light or heavy syrup. Fried fruit. Fruit in cream or butter sauce. Meat and other protein foods Fatty cuts of meat. Ribs. Fried meat. Bacon. Sausage. Bologna and other processed lunch meats. Salami. Fatback. Hotdogs. Bratwurst. Salted nuts and seeds. Canned beans with added salt. Canned or smoked fish. Whole eggs or egg yolks. Chicken or turkey with skin. Dairy Whole or 2% milk, cream, and half-and-half. Whole or full-fat cream cheese. Whole-fat or sweetened yogurt. Full-fat cheese. Nondairy creamers. Whipped toppings. Processed cheese and cheese spreads. Fats and oils Butter. Stick margarine. Lard. Shortening. Ghee. Bacon fat. Tropical oils, such as coconut, palm kernel, or palm oil. Seasoning and other foods Salted popcorn and pretzels. Onion salt, garlic salt, seasoned salt, table salt, and sea salt. Worcestershire sauce. Tartar sauce. Barbecue sauce. Teriyaki sauce. Soy sauce, including reduced-sodium. Steak sauce. Canned and packaged gravies. Fish sauce. Oyster sauce. Cocktail sauce. Horseradish that you find on the shelf. Ketchup. Mustard. Meat flavorings and tenderizers. Bouillon cubes. Hot sauce and Tabasco sauce. Premade or packaged marinades. Premade or packaged taco seasonings.   Relishes. Regular salad dressings. Where to find more information:  National Heart, Lung, and Blood Institute: www.nhlbi.nih.gov  American Heart Association: www.heart.org Summary  The DASH eating plan is a healthy eating plan that has been shown to reduce high blood pressure (hypertension). It may also reduce your risk for type 2 diabetes, heart disease, and stroke.  With the DASH eating plan, you should limit salt (sodium) intake to 2,300 mg a  day. If you have hypertension, you may need to reduce your sodium intake to 1,500 mg a day.  When on the DASH eating plan, aim to eat more fresh fruits and vegetables, whole grains, lean proteins, low-fat dairy, and heart-healthy fats.  Work with your health care provider or diet and nutrition specialist (dietitian) to adjust your eating plan to your individual calorie needs. This information is not intended to replace advice given to you by your health care provider. Make sure you discuss any questions you have with your health care provider. Document Released: 01/16/2011 Document Revised: 01/21/2016 Document Reviewed: 01/21/2016 Elsevier Interactive Patient Education  2018 Elsevier Inc.  

## 2017-09-21 ENCOUNTER — Other Ambulatory Visit: Payer: Self-pay | Admitting: Family Medicine

## 2017-09-21 DIAGNOSIS — L210 Seborrhea capitis: Secondary | ICD-10-CM

## 2017-09-21 NOTE — Telephone Encounter (Signed)
Refill request for betamethasone ddipropionate 0.05% lotion 60 ml approved with no refills. Dgaddy, CMA

## 2017-09-21 NOTE — Telephone Encounter (Signed)
Bethamethasone DP 0.05% LoT refill Last Refill:06/28/17 by Milus Glazierhelle Jeffrey,PA Last OV: 09/02/17 PCP: Dr. Creta LevinStallings

## 2017-10-23 ENCOUNTER — Other Ambulatory Visit: Payer: Self-pay | Admitting: Family Medicine

## 2017-10-23 DIAGNOSIS — I10 Essential (primary) hypertension: Secondary | ICD-10-CM

## 2017-10-23 DIAGNOSIS — R609 Edema, unspecified: Secondary | ICD-10-CM

## 2017-11-18 DIAGNOSIS — L732 Hidradenitis suppurativa: Secondary | ICD-10-CM | POA: Diagnosis not present

## 2017-11-30 ENCOUNTER — Other Ambulatory Visit: Payer: Self-pay | Admitting: Family Medicine

## 2017-11-30 DIAGNOSIS — R12 Heartburn: Secondary | ICD-10-CM

## 2017-12-03 ENCOUNTER — Encounter: Payer: Self-pay | Admitting: Family Medicine

## 2017-12-03 ENCOUNTER — Other Ambulatory Visit: Payer: Self-pay

## 2017-12-03 ENCOUNTER — Ambulatory Visit: Payer: 59 | Admitting: Family Medicine

## 2017-12-03 VITALS — BP 146/98 | HR 61 | Temp 98.1°F | Resp 16 | Ht 72.0 in | Wt 245.8 lb

## 2017-12-03 DIAGNOSIS — R609 Edema, unspecified: Secondary | ICD-10-CM

## 2017-12-03 DIAGNOSIS — I10 Essential (primary) hypertension: Secondary | ICD-10-CM | POA: Diagnosis not present

## 2017-12-03 DIAGNOSIS — Z23 Encounter for immunization: Secondary | ICD-10-CM

## 2017-12-03 MED ORDER — LISINOPRIL-HYDROCHLOROTHIAZIDE 10-12.5 MG PO TABS: 1.0000 | ORAL_TABLET | Freq: Every day | ORAL | 0 refills | Status: DC

## 2017-12-03 NOTE — Progress Notes (Signed)
Chief Complaint  Patient presents with  . Follow-up    hypertention    HPI   Hypertension: Patient here for follow-up of elevated blood pressure. He is exercising and is adherent to low salt diet.  Blood pressure is well controlled at home but is on prednisone which raised his bp. Cardiac symptoms none. Patient denies chest pain, chest pressure/discomfort, claudication, dyspnea, exertional chest pressure/discomfort, fatigue, irregular heart beat and lower extremity edema.  Cardiovascular risk factors: hypertension. Use of agents associated with hypertension: none and oral prednisone. History of target organ damage: none.  BP Readings from Last 3 Encounters:  12/03/17 (!) 146/98  09/02/17 138/82  07/29/17 (!) 148/80   He reports that is dependent edema is improved  Past Medical History:  Diagnosis Date  . Hypertension     Current Outpatient Medications  Medication Sig Dispense Refill  . betamethasone dipropionate 0.05 % lotion APPLY TOPICALLY 2 (TWO) TIMES DAILY. STOP USE X 1 WEEK AFTER EVERY 2 WEEKS OF DAILY USE. 60 mL 0  . hydrocortisone 2.5 % cream APPLY TO AFFECTED AREA TWICE A DAY AS NEEDED ON FACE  2  . ketoconazole (NIZORAL) 2 % shampoo APPLY 1 APPLICATION TOPICALLY 2 (TWO) TIMES A WEEK. 120 mL 0  . lisinopril-hydrochlorothiazide (PRINZIDE,ZESTORETIC) 10-12.5 MG tablet Take 1 tablet by mouth daily. 90 tablet 0  . pantoprazole (PROTONIX) 40 MG tablet TAKE 1 TABLET BY MOUTH EVERY DAY 30 tablet 3  . predniSONE (DELTASONE) 10 MG tablet Take 10 mg by mouth daily with breakfast.    . Vitamin D, Ergocalciferol, (DRISDOL) 50000 units CAPS capsule Take 1 capsule (50,000 Units total) by mouth every 7 (seven) days. 12 capsule 3  . fluticasone (FLONASE) 50 MCG/ACT nasal spray Place 2 sprays into both nostrils daily. (Patient not taking: Reported on 12/03/2017) 16 g 12   No current facility-administered medications for this visit.     Allergies:  Allergies  Allergen Reactions  .  Aspirin Hives    Past Surgical History:  Procedure Laterality Date  . DENTAL SURGERY Left   . WISDOM TOOTH EXTRACTION      Social History   Socioeconomic History  . Marital status: Married    Spouse name: Therapist, occupational  . Number of children: 2  . Years of education: Associates  . Highest education level: Not on file  Occupational History  . Occupation: Heavy Arts development officer: [employer]    Comment: (trash truck driver)  . Occupation: Night Job    Comment: [employer]  Social Needs  . Financial resource strain: Not on file  . Food insecurity:    Worry: Not on file    Inability: Not on file  . Transportation needs:    Medical: Not on file    Non-medical: Not on file  Tobacco Use  . Smoking status: Former Smoker    Types: Cigars  . Smokeless tobacco: Never Used  . Tobacco comment: MAYBE 5 OR 6 DAYS  Substance and Sexual Activity  . Alcohol use: Yes    Alcohol/week: 1.0 - 10.0 standard drinks    Types: 1 - 10 Standard drinks or equivalent per week    Comment: MIXED DRINKS  . Drug use: No  . Sexual activity: Yes    Partners: Female  Lifestyle  . Physical activity:    Days per week: Not on file    Minutes per session: Not on file  . Stress: Not on file  Relationships  . Social connections:  Talks on phone: Not on file    Gets together: Not on file    Attends religious service: Not on file    Active member of club or organization: Not on file    Attends meetings of clubs or organizations: Not on file    Relationship status: Not on file  Other Topics Concern  . Not on file  Social History Narrative   Lives with his wife and two daughters.    Family History  Problem Relation Age of Onset  . Hypertension Mother   . Anemia Mother   . Hypertension Father   . Diabetes Father   . Hypertension Brother   . Drug abuse Brother   . Cancer Sister        brain  . Hypertension Maternal Aunt   . Hypertension Maternal Uncle   . Hypertension Paternal  Aunt   . Glaucoma Paternal Aunt   . Diabetes Paternal Aunt   . Hypertension Paternal Uncle   . Diabetes Paternal Uncle      ROS Review of Systems See HPI Constitution: No fevers or chills No malaise No diaphoresis Skin: No rash or itching Eyes: no blurry vision, no double vision GU: no dysuria or hematuria Neuro: no dizziness or headaches all others reviewed and negative   Objective: Vitals:   12/03/17 0839 12/03/17 0840  BP: (!) 182/109 (!) 146/98  Pulse: 61   Resp: 16   Temp: 98.1 F (36.7 C)   TempSrc: Oral   SpO2: 98%   Weight: 245 lb 12.8 oz (111.5 kg)   Height: 6' (1.829 m)     Physical Exam  Constitutional: He is oriented to person, place, and time. He appears well-developed and well-nourished.  HENT:  Head: Normocephalic and atraumatic.  Eyes: Conjunctivae and EOM are normal.  Cardiovascular: Normal rate, regular rhythm and normal heart sounds.  No murmur heard. Pulmonary/Chest: Effort normal and breath sounds normal. No stridor. No respiratory distress. He has no wheezes.  Neurological: He is alert and oriented to person, place, and time.  Skin: Skin is warm. Capillary refill takes less than 2 seconds.  Psychiatric: He has a normal mood and affect. His behavior is normal. Judgment and thought content normal.     Assessment and Plan Eugene Gomez was seen today for follow-up.  Diagnoses and all orders for this visit:  Essential hypertension- discussed that prednisone can increase the blood pressure thus will continue his current dose and reassess off the prednisone He should increase his exercise, work on his stress mgmt and return to clinic for recheck -     Basic Metabolic Panel -     lisinopril-hydrochlorothiazide (PRINZIDE,ZESTORETIC) 10-12.5 MG tablet; Take 1 tablet by mouth daily.  Dependent edema - discussed that he should continue to avoid salty foods -     lisinopril-hydrochlorothiazide (PRINZIDE,ZESTORETIC) 10-12.5 MG tablet; Take 1 tablet by  mouth daily.  Need for prophylactic vaccination and inoculation against influenza -     Flu Vaccine QUAD 6+ mos PF IM (Fluarix Quad PF)   Follow up in 3 months   Fernand Sorbello A Creta Levin

## 2017-12-03 NOTE — Patient Instructions (Signed)
How to Take Your Blood Pressure  Blood pressure is a measurement of how strongly your blood is pressing against the walls of your arteries. Arteries are blood vessels that carry blood from your heart throughout your body. Your health care provider takes your blood pressure at each office visit. You can also take your own blood pressure at home with a blood pressure machine. You may need to take your own blood pressure:   To confirm a diagnosis of high blood pressure (hypertension).   To monitor your blood pressure over time.   To make sure your blood pressure medicine is working.    Supplies needed:  To take your blood pressure, you will need a blood pressure machine. You can buy a blood pressure machine, or blood pressure monitor, at most drugstores or online. There are several types of home blood pressure monitors. When choosing one, consider the following:   Choose a monitor that has an arm cuff.   Choose a monitor that wraps snugly around your upper arm. You should be able to fit only one finger between your arm and the cuff.   Do not choose a monitor that measures your blood pressure from your wrist or finger.    Your health care provider can suggest a reliable monitor that will meet your needs.  How to prepare  To get the most accurate reading, avoid the following for 30 minutes before you check your blood pressure:   Drinking caffeine.   Drinking alcohol.   Eating.   Smoking.   Exercising.    Five minutes before you check your blood pressure:   Empty your bladder.   Sit quietly without talking in a dining chair, rather than in a soft couch or armchair.    How to take your blood pressure  To check your blood pressure, follow the instructions in the manual that came with your blood pressure monitor. If you have a digital blood pressure monitor, the instructions may be as follows:  1. Sit up straight.  2. Place your feet on the floor. Do not cross your ankles or legs.  3. Rest your left arm at the  level of your heart on a table or desk or on the arm of a chair.  4. Pull up your shirt sleeve.  5. Wrap the blood pressure cuff around the upper part of your left arm, 1 inch (2.5 cm) above your elbow. It is best to wrap the cuff around bare skin.  6. Fit the cuff snugly around your arm. You should be able to place only one finger between the cuff and your arm.  7. Position the cord inside the groove of your elbow.  8. Press the power button.  9. Sit quietly while the cuff inflates and deflates.  10. Read the digital reading on the monitor screen and write it down (record it).  11. Wait 2-3 minutes, then repeat the steps, starting at step 1.    What does my blood pressure reading mean?  A blood pressure reading consists of a higher number over a lower number. Ideally, your blood pressure should be below 120/80. The first ("top") number is called the systolic pressure. It is a measure of the pressure in your arteries as your heart beats. The second ("bottom") number is called the diastolic pressure. It is a measure of the pressure in your arteries as the heart relaxes.  Blood pressure is classified into four stages. The following are the stages for adults who do   not have a short-term serious illness or a chronic condition. Systolic pressure and diastolic pressure are measured in a unit called mm Hg.  Normal   Systolic pressure: below 120.   Diastolic pressure: below 80.  Elevated   Systolic pressure: 120-129.   Diastolic pressure: below 80.  Hypertension stage 1   Systolic pressure: 130-139.   Diastolic pressure: 80-89.  Hypertension stage 2   Systolic pressure: 140 or above.   Diastolic pressure: 90 or above.  You can have prehypertension or hypertension even if only the systolic or only the diastolic number in your reading is higher than normal.  Follow these instructions at home:   Check your blood pressure as often as recommended by your health care provider.   Take your monitor to the next appointment  with your health care provider to make sure:  ? That you are using it correctly.  ? That it provides accurate readings.   Be sure you understand what your goal blood pressure numbers are.   Tell your health care provider if you are having any side effects from blood pressure medicine.  Contact a health care provider if:   Your blood pressure is consistently high.  Get help right away if:   Your systolic blood pressure is higher than 180.   Your diastolic blood pressure is higher than 110.  This information is not intended to replace advice given to you by your health care provider. Make sure you discuss any questions you have with your health care provider.  Document Released: 07/06/2015 Document Revised: 09/18/2015 Document Reviewed: 07/06/2015  Elsevier Interactive Patient Education  2018 Elsevier Inc.

## 2017-12-04 LAB — BASIC METABOLIC PANEL
BUN/Creatinine Ratio: 19 (ref 9–20)
BUN: 20 mg/dL (ref 6–20)
CO2: 24 mmol/L (ref 20–29)
Calcium: 10.4 mg/dL — ABNORMAL HIGH (ref 8.7–10.2)
Chloride: 99 mmol/L (ref 96–106)
Creatinine, Ser: 1.05 mg/dL (ref 0.76–1.27)
GFR calc Af Amer: 105 mL/min/{1.73_m2} (ref >59)
GFR, EST NON AFRICAN AMERICAN: 91 mL/min/{1.73_m2} (ref >59)
GLUCOSE: 86 mg/dL (ref 65–99)
Potassium: 3.9 mmol/L (ref 3.5–5.2)
SODIUM: 141 mmol/L (ref 134–144)

## 2018-01-06 ENCOUNTER — Telehealth: Payer: Self-pay | Admitting: Family Medicine

## 2018-01-06 NOTE — Telephone Encounter (Signed)
MyChart message sent to pt about rescheduling their appt on 03/01/18

## 2018-02-13 DIAGNOSIS — G5621 Lesion of ulnar nerve, right upper limb: Secondary | ICD-10-CM | POA: Diagnosis not present

## 2018-02-13 DIAGNOSIS — M79601 Pain in right arm: Secondary | ICD-10-CM | POA: Diagnosis not present

## 2018-02-13 DIAGNOSIS — M25531 Pain in right wrist: Secondary | ICD-10-CM | POA: Diagnosis not present

## 2018-03-01 ENCOUNTER — Ambulatory Visit: Payer: 59 | Admitting: Family Medicine

## 2018-03-02 DIAGNOSIS — M25531 Pain in right wrist: Secondary | ICD-10-CM | POA: Diagnosis not present

## 2018-03-02 DIAGNOSIS — M79601 Pain in right arm: Secondary | ICD-10-CM | POA: Diagnosis not present

## 2018-03-23 ENCOUNTER — Ambulatory Visit: Payer: 59 | Admitting: Family Medicine

## 2018-03-23 ENCOUNTER — Other Ambulatory Visit: Payer: Self-pay

## 2018-03-23 ENCOUNTER — Encounter: Payer: Self-pay | Admitting: Family Medicine

## 2018-03-23 VITALS — BP 158/100 | HR 83 | Temp 98.4°F | Resp 17 | Ht 72.0 in | Wt 249.8 lb

## 2018-03-23 DIAGNOSIS — I10 Essential (primary) hypertension: Secondary | ICD-10-CM | POA: Diagnosis not present

## 2018-03-23 DIAGNOSIS — R609 Edema, unspecified: Secondary | ICD-10-CM

## 2018-03-23 DIAGNOSIS — E559 Vitamin D deficiency, unspecified: Secondary | ICD-10-CM | POA: Diagnosis not present

## 2018-03-23 MED ORDER — LISINOPRIL-HYDROCHLOROTHIAZIDE 20-25 MG PO TABS: 1.0000 | ORAL_TABLET | Freq: Every day | ORAL | 0 refills | Status: DC

## 2018-03-23 NOTE — Progress Notes (Signed)
Established Patient Office Visit  Subjective:  Patient ID: Eugene Gomez, male    DOB: 1980/08/11  Age: 38 y.o. MRN: [id number]  CC:  Chief Complaint  Patient presents with  . 3 month f/u HTN    Itching on back no rash but itches really bad and continued cough from cold sxs    HPI Eugene Fossalonzo Calvo presents for   Hypertension: Patient here for follow-up of elevated blood pressure. He is exercising and is adherent to low salt diet.  Blood pressure is not well controlled at home. Cardiac symptoms none. Patient denies chest pain, claudication, exertional chest pressure/discomfort, irregular heart beat, lower extremity edema, near-syncope, orthopnea and palpitations.  Cardiovascular risk factors: hypertension, male gender and obesity (BMI >= 30 kg/m2). Use of agents associated with hypertension: steroids. History of target organ damage: none. BP Readings from Last 3 Encounters:  03/23/18 (!) 158/100  12/03/17 (!) 146/98  09/02/17 138/82   He is taking lisinopril-hctz 10-12.5mg  He states that he was taking prednisone for dermatologic and orthopedic issues He reports that he is exercising about 3 times a week He states that he does cardio and weight training     Body mass index is 33.88 kg/m. Wt Readings from Last 3 Encounters:  03/23/18 249 lb 12.8 oz (113.3 kg)  12/03/17 245 lb 12.8 oz (111.5 kg)  09/02/17 235 lb 6.4 oz (106.8 kg)    Past Medical History:  Diagnosis Date  . Hypertension     Past Surgical History:  Procedure Laterality Date  . DENTAL SURGERY Left   . WISDOM TOOTH EXTRACTION      Family History  Problem Relation Age of Onset  . Hypertension Mother   . Anemia Mother   . Hypertension Father   . Diabetes Father   . Hypertension Brother   . Drug abuse Brother   . Cancer Sister        brain  . Hypertension Maternal Aunt   . Hypertension Maternal Uncle   . Hypertension Paternal Aunt   . Glaucoma Paternal Aunt   . Diabetes Paternal Aunt   . Hypertension  Paternal Uncle   . Diabetes Paternal Uncle     Social History   Socioeconomic History  . Marital status: Married    Spouse name: Therapist, occupationalChassity Clapp  . Number of children: 2  . Years of education: Associates  . Highest education level: Not on file  Occupational History  . Occupation: Heavy Arts development officerquipment Operator    Employer: [employer]    Comment: (trash truck driver)  . Occupation: Night Job    Comment: [employer]  Social Needs  . Financial resource strain: Not on file  . Food insecurity:    Worry: Not on file    Inability: Not on file  . Transportation needs:    Medical: Not on file    Non-medical: Not on file  Tobacco Use  . Smoking status: Former Smoker    Types: Cigars  . Smokeless tobacco: Never Used  . Tobacco comment: MAYBE 5 OR 6 DAYS  Substance and Sexual Activity  . Alcohol use: Yes    Alcohol/week: 1.0 - 10.0 standard drinks    Types: 1 - 10 Standard drinks or equivalent per week    Comment: MIXED DRINKS  . Drug use: No  . Sexual activity: Yes    Partners: Female  Lifestyle  . Physical activity:    Days per week: Not on file    Minutes per session: Not on file  .  Stress: Not on file  Relationships  . Social connections:    Talks on phone: Not on file    Gets together: Not on file    Attends religious service: Not on file    Active member of club or organization: Not on file    Attends meetings of clubs or organizations: Not on file    Relationship status: Not on file  . Intimate partner violence:    Fear of current or ex partner: Not on file    Emotionally abused: Not on file    Physically abused: Not on file    Forced sexual activity: Not on file  Other Topics Concern  . Not on file  Social History Narrative   Lives with his wife and two daughters.    Outpatient Medications Prior to Visit  Medication Sig Dispense Refill  . betamethasone dipropionate 0.05 % lotion APPLY TOPICALLY 2 (TWO) TIMES DAILY. STOP USE X 1 WEEK AFTER EVERY 2 WEEKS OF DAILY  USE. 60 mL 0  . hydrocortisone 2.5 % cream APPLY TO AFFECTED AREA TWICE A DAY AS NEEDED ON FACE  2  . ketoconazole (NIZORAL) 2 % shampoo APPLY 1 APPLICATION TOPICALLY 2 (TWO) TIMES A WEEK. 120 mL 0  . pantoprazole (PROTONIX) 40 MG tablet TAKE 1 TABLET BY MOUTH EVERY DAY 30 tablet 3  . Vitamin D, Ergocalciferol, (DRISDOL) 50000 units CAPS capsule Take 1 capsule (50,000 Units total) by mouth every 7 (seven) days. 12 capsule 3  . lisinopril-hydrochlorothiazide (PRINZIDE,ZESTORETIC) 10-12.5 MG tablet Take 1 tablet by mouth daily. 90 tablet 0  . fluticasone (FLONASE) 50 MCG/ACT nasal spray Place 2 sprays into both nostrils daily. (Patient not taking: Reported on 12/03/2017) 16 g 12  . predniSONE (DELTASONE) 10 MG tablet Take 10 mg by mouth daily with breakfast.     No facility-administered medications prior to visit.     Allergies  Allergen Reactions  . Aspirin Hives    ROS Review of Systems Review of Systems  Constitutional: Negative for activity change, appetite change, chills and fever.  HENT: Negative for congestion, nosebleeds, trouble swallowing and voice change.   Respiratory: Negative for cough, shortness of breath and wheezing.   Gastrointestinal: Negative for diarrhea, nausea and vomiting.  Genitourinary: Negative for difficulty urinating, dysuria, flank pain and hematuria.  Musculoskeletal: Negative for back pain, joint swelling and neck pain.  Neurological: Negative for dizziness, speech difficulty, light-headedness and numbness.  See HPI. All other review of systems negative.        Objective:    Physical Exam  BP (!) 158/100 (BP Location: Left Arm, Patient Position: Sitting, Cuff Size: Large)   Pulse 83   Temp 98.4 F (36.9 C) (Oral)   Resp 17   Ht 6' (1.829 m)   Wt 249 lb 12.8 oz (113.3 kg)   SpO2 98%   BMI 33.88 kg/m  Wt Readings from Last 3 Encounters:  03/23/18 249 lb 12.8 oz (113.3 kg)  12/03/17 245 lb 12.8 oz (111.5 kg)  09/02/17 235 lb 6.4 oz (106.8  kg)   Physical Exam  Constitutional: Oriented to person, place, and time. Appears well-developed and well-nourished.  HENT:  Head: Normocephalic and atraumatic.  Eyes: Conjunctivae and EOM are normal.  Cardiovascular: Normal rate, regular rhythm, normal heart sounds and intact distal pulses.  No murmur heard. Pulmonary/Chest: Effort normal and breath sounds normal. No stridor. No respiratory distress. Has no wheezes.  Neurological: Is alert and oriented to person, place, and time.  Skin: Skin is  warm. Capillary refill takes less than 2 seconds.  Psychiatric: Has a normal mood and affect. Behavior is normal. Judgment and thought content normal.    There are no preventive care reminders to display for this patient.  There are no preventive care reminders to display for this patient.  Lab Results  Component Value Date   TSH 0.643 07/29/2017   Lab Results  Component Value Date   WBC 6.2 07/29/2017   HGB 14.9 07/29/2017   HCT 45.1 07/29/2017   MCV 76 (L) 07/29/2017   PLT 334 07/29/2017   Lab Results  Component Value Date   NA 141 12/03/2017   K 3.9 12/03/2017   CO2 24 12/03/2017   GLUCOSE 86 12/03/2017   BUN 20 12/03/2017   CREATININE 1.05 12/03/2017   BILITOT 0.3 07/29/2017   ALKPHOS 115 07/29/2017   AST 18 07/29/2017   ALT 31 07/29/2017   PROT 7.7 07/29/2017   ALBUMIN 4.5 07/29/2017   CALCIUM 10.4 (H) 12/03/2017   Lab Results  Component Value Date   CHOL 152 07/29/2017   Lab Results  Component Value Date   HDL 49 07/29/2017   Lab Results  Component Value Date   LDLCALC 90 07/29/2017   Lab Results  Component Value Date   TRIG 63 07/29/2017   Lab Results  Component Value Date   CHOLHDL 3.1 07/29/2017   Lab Results  Component Value Date   HGBA1C 5.5 07/29/2017      Assessment & Plan:   Problem List Items Addressed This Visit      Cardiovascular and Mediastinum   Hypertension - Primary Increased bp med and added hctz I recommend that you  exercise for 30-45 minutes 5 days a week. I also recommend a balanced diet with fruits and vegetables every day, lean meats, and little fried foods. The DASH diet (you can find this online) is a good example of this.    Relevant Medications   lisinopril-hydrochlorothiazide (PRINZIDE,ZESTORETIC) 20-25 MG tablet   Other Relevant Orders   Comprehensive metabolic panel (Completed)   Lipid panel (Completed)    Other Visit Diagnoses    Dependent edema     Discussed using diuretic and DASH diet   Relevant Orders   Comprehensive metabolic panel (Completed)   Vitamin D deficiency    -  Will check    Relevant Orders   Vitamin D, 25-hydroxy (Completed)      Meds ordered this encounter  Medications  . lisinopril-hydrochlorothiazide (PRINZIDE,ZESTORETIC) 20-25 MG tablet    Sig: Take 1 tablet by mouth daily.    Dispense:  90 tablet    Refill:  0    Follow-up: No follow-ups on file.    Doristine Bosworth, MD

## 2018-03-23 NOTE — Patient Instructions (Addendum)
Apply lotrimin to the affected areas    If you have lab work done today you will be contacted with your lab results within the next 2 weeks.  If you have not heard from [place] then please contact us. The fastest way to get your results is to register for My Chart.   IF you received an x-ray today, you will receive an invoice from [facility]. Please contact [facility] at [phone] with questions or concerns regarding your invoice.   IF you received labwork today, you will receive an invoice from [place]. Please contact [facility] at [phone] with questions or concerns regarding your invoice.   Our billing staff will not be able to assist you with questions regarding bills from these companies.  You will be contacted with the lab results as soon as they are available. The fastest way to get your results is to activate your My Chart account. Instructions are located on the last page of this paperwork. If you have not heard from [place] regarding the results in 2 weeks, please contact this office.

## 2018-03-24 LAB — COMPREHENSIVE METABOLIC PANEL
ALT: 33 IU/L (ref 0–44)
AST: 20 IU/L (ref 0–40)
Albumin/Globulin Ratio: 1.5 (ref 1.2–2.2)
Albumin: 4.4 g/dL (ref 4.0–5.0)
Alkaline Phosphatase: 104 IU/L (ref 39–117)
BUN/Creatinine Ratio: 10 (ref 9–20)
BUN: 11 mg/dL (ref 6–20)
Bilirubin Total: 0.2 mg/dL (ref 0.0–1.2)
CO2: 25 mmol/L (ref 20–29)
Calcium: 10.2 mg/dL (ref 8.7–10.2)
Chloride: 101 mmol/L (ref 96–106)
Creatinine, Ser: 1.13 mg/dL (ref 0.76–1.27)
GFR calc Af Amer: 96 mL/min/{1.73_m2} (ref >59)
GFR, EST NON AFRICAN AMERICAN: 83 mL/min/{1.73_m2} (ref >59)
Globulin, Total: 2.9 g/dL (ref 1.5–4.5)
Glucose: 83 mg/dL (ref 65–99)
Potassium: 4.2 mmol/L (ref 3.5–5.2)
Sodium: 141 mmol/L (ref 134–144)
TOTAL PROTEIN: 7.3 g/dL (ref 6.0–8.5)

## 2018-03-24 LAB — LIPID PANEL
Chol/HDL Ratio: 3.5 ratio (ref 0.0–5.0)
Cholesterol, Total: 156 mg/dL (ref 100–199)
HDL: 45 mg/dL (ref >39)
LDL Calculated: 81 mg/dL (ref 0–99)
TRIGLYCERIDES: 152 mg/dL — AB (ref 0–149)
VLDL Cholesterol Cal: 30 mg/dL (ref 5–40)

## 2018-03-24 LAB — VITAMIN D 25 HYDROXY (VIT D DEFICIENCY, FRACTURES): Vit D, 25-Hydroxy: 42.7 ng/mL (ref 30.0–100.0)

## 2018-03-27 ENCOUNTER — Other Ambulatory Visit: Payer: Self-pay | Admitting: Family Medicine

## 2018-03-27 DIAGNOSIS — L210 Seborrhea capitis: Secondary | ICD-10-CM

## 2018-03-29 ENCOUNTER — Telehealth: Payer: Self-pay | Admitting: Family Medicine

## 2018-03-29 NOTE — Telephone Encounter (Signed)
Called and spoke with pt regarding their appt scheduled for 04/28/18 with Dr. Creta Levin. Due to Dr. Creta Levin being out of the office, pt needed to be rescheduled. I was able to reschedule for 04/30/18 with Dr. Creta Levin. I advised of time and late policy. Pt acknowledged.

## 2018-03-29 NOTE — Telephone Encounter (Signed)
Requested medication (s) are due for refill today: yes  Requested medication (s) are on the active medication list: yes  Last refill:  09/21/17  Future visit scheduled: yes  Notes to clinic:  No assigned protocol    Requested Prescriptions  Pending Prescriptions Disp Refills   betamethasone dipropionate 0.05 % lotion [Pharmacy Med Name: BETAMETHASONE DP 0.05% LOT] 60 mL 0    Sig: APPLY TOPICALLY 2 (TWO) TIMES DAILY. STOP USE X 1 WEEK AFTER EVERY 2 WEEKS OF DAILY USE.     Off-Protocol Failed - 03/27/2018 12:51 PM      Failed - Medication not assigned to a protocol, review manually.      Passed - Valid encounter within last 12 months    Recent Outpatient Visits          6 days ago Essential hypertension   Primary Care at [facility], Manus Rudd, MD   3 months ago Essential hypertension   Primary Care at [facility], Manus Rudd, MD   6 months ago Essential hypertension   Primary Care at [facility], Manus Rudd, MD   8 months ago Essential hypertension   Primary Care at [facility], Manus Rudd, MD   10 months ago Essential hypertension   Primary Care at [facility], [place], [place]      Future Appointments            In 1 month Doristine Bosworth, MD Primary Care at [place], [facility] [place]

## 2018-04-10 ENCOUNTER — Other Ambulatory Visit: Payer: Self-pay | Admitting: Family Medicine

## 2018-04-10 DIAGNOSIS — R12 Heartburn: Secondary | ICD-10-CM

## 2018-04-12 NOTE — Telephone Encounter (Signed)
Requested Prescriptions  Pending Prescriptions Disp Refills  . pantoprazole (PROTONIX) 40 MG tablet [Pharmacy Med Name: PANTOPRAZOLE SOD DR 40 MG TAB] 30 tablet 3    Sig: TAKE 1 TABLET BY MOUTH EVERY DAY     Gastroenterology: Proton Pump Inhibitors Passed - 04/10/2018  1:11 AM      Passed - Valid encounter within last 12 months    Recent Outpatient Visits          2 weeks ago Essential hypertension   Primary Care at [facility], Manus Rudd, MD   4 months ago Essential hypertension   Primary Care at [facility], Manus Rudd, MD   7 months ago Essential hypertension   Primary Care at [facility], Manus Rudd, MD   8 months ago Essential hypertension   Primary Care at [facility], Manus Rudd, MD   11 months ago Essential hypertension   Primary Care at [facility], [place], [place]      Future Appointments            In 2 weeks Doristine Bosworth, MD Primary Care at [place], [facility]

## 2018-04-23 ENCOUNTER — Encounter: Payer: Self-pay | Admitting: Family Medicine

## 2018-04-28 ENCOUNTER — Ambulatory Visit: Payer: 59 | Admitting: Family Medicine

## 2018-04-28 DIAGNOSIS — B360 Pityriasis versicolor: Secondary | ICD-10-CM | POA: Diagnosis not present

## 2018-04-28 DIAGNOSIS — Z0100 Encounter for examination of eyes and vision without abnormal findings: Secondary | ICD-10-CM | POA: Diagnosis not present

## 2018-04-30 ENCOUNTER — Ambulatory Visit: Payer: 59 | Admitting: Family Medicine

## 2018-05-03 ENCOUNTER — Other Ambulatory Visit: Payer: Self-pay

## 2018-05-03 ENCOUNTER — Encounter: Payer: Self-pay | Admitting: Family Medicine

## 2018-05-03 ENCOUNTER — Ambulatory Visit: Payer: 59 | Admitting: Family Medicine

## 2018-05-03 VITALS — BP 162/100 | HR 79 | Temp 98.3°F | Resp 18 | Ht 72.0 in | Wt 246.8 lb

## 2018-05-03 DIAGNOSIS — I10 Essential (primary) hypertension: Secondary | ICD-10-CM | POA: Diagnosis not present

## 2018-05-03 DIAGNOSIS — R058 Other specified cough: Secondary | ICD-10-CM

## 2018-05-03 DIAGNOSIS — R05 Cough: Secondary | ICD-10-CM | POA: Diagnosis not present

## 2018-05-03 DIAGNOSIS — L309 Dermatitis, unspecified: Secondary | ICD-10-CM

## 2018-05-03 DIAGNOSIS — L210 Seborrhea capitis: Secondary | ICD-10-CM | POA: Diagnosis not present

## 2018-05-03 DIAGNOSIS — R609 Edema, unspecified: Secondary | ICD-10-CM

## 2018-05-03 DIAGNOSIS — T464X5A Adverse effect of angiotensin-converting-enzyme inhibitors, initial encounter: Secondary | ICD-10-CM

## 2018-05-03 DIAGNOSIS — R053 Chronic cough: Secondary | ICD-10-CM

## 2018-05-03 MED ORDER — TRIAMTERENE-HCTZ 37.5-25 MG PO TABS: 1.0000 | ORAL_TABLET | Freq: Every day | ORAL | 1 refills | Status: DC

## 2018-05-03 MED ORDER — BETAMETHASONE DIPROPIONATE 0.05 % EX LOTN: TOPICAL_LOTION | Freq: Two times a day (BID) | CUTANEOUS | 0 refills | Status: DC

## 2018-05-03 MED ORDER — VERAPAMIL HCL ER 120 MG PO TBCR: 120.0000 mg | EXTENDED_RELEASE_TABLET | Freq: Every day | ORAL | 1 refills | Status: DC

## 2018-05-03 NOTE — Patient Instructions (Addendum)
Goal for BP 140/90 or less       If you have lab work done today you will be contacted with your lab results within the next 2 weeks.  If you have not heard from [place] then please contact us. The fastest way to get your results is to register for My Chart.   IF you received an x-ray today, you will receive an invoice from [facility]. Please contact [facility] at [phone] with questions or concerns regarding your invoice.   IF you received labwork today, you will receive an invoice from [place]. Please contact [facility] at [phone] with questions or concerns regarding your invoice.   Our billing staff will not be able to assist you with questions regarding bills from these companies.  You will be contacted with the lab results as soon as they are available. The fastest way to get your results is to activate your My Chart account. Instructions are located on the last page of this paperwork. If you have not heard from [place] regarding the results in 2 weeks, please contact this office.     How to Take Your Blood Pressure You can take your blood pressure at home with a machine. You may need to check your blood pressure at home:  To check if you have high blood pressure (hypertension).  To check your blood pressure over time.  To make sure your blood pressure medicine is working. Supplies needed: You will need a blood pressure machine, or monitor. You can buy one at a drugstore or online. When choosing one:  Choose one with an arm cuff.  Choose one that wraps around your upper arm. Only one finger should fit between your arm and the cuff.  Do not choose one that measures your blood pressure from your wrist or finger. Your doctor can suggest a monitor. How to prepare Avoid these things for 30 minutes before checking your blood pressure:  Drinking caffeine.  Drinking alcohol.  Eating.  Smoking.  Exercising. Five minutes before checking your blood  pressure:  Pee.  Sit in a dining chair. Avoid sitting in a soft couch or armchair.  Be quiet. Do not talk. How to take your blood pressure Follow the instructions that came with your machine. If you have a digital blood pressure monitor, these may be the instructions: 1. Sit up straight. 2. Place your feet on the floor. Do not cross your ankles or legs. 3. Rest your left arm at the level of your heart. You may rest it on a table, desk, or chair. 4. Pull up your shirt sleeve. 5. Wrap the blood pressure cuff around the upper part of your left arm. The cuff should be 1 inch (2.5 cm) above your elbow. It is best to wrap the cuff around bare skin. 6. Fit the cuff snugly around your arm. You should be able to place only one finger between the cuff and your arm. 7. Put the cord inside the groove of your elbow. 8. Press the power button. 9. Sit quietly while the cuff fills with air and loses air. 10. Write down the numbers on the screen. 11. Wait 2-3 minutes and then repeat steps 1-10. What do the numbers mean? Two numbers make up your blood pressure. The first number is called systolic pressure. The second is called diastolic pressure. An example of a blood pressure reading is "120 over 80" (or 120/80). If you are an adult and do not have a medical condition, use this guide  to find out if your blood pressure is normal: Normal  First number: below 120.  Second number: below 80. Elevated  First number: 120-129.  Second number: below 80. Hypertension stage 1  First number: 130-139.  Second number: 80-89. Hypertension stage 2  First number: 140 or above.  Second number: 90 or above. Your blood pressure is above normal even if only the top or bottom number is above normal. Follow these instructions at home:  Check your blood pressure as often as your doctor tells you to.  Take your monitor to your next doctor's appointment. Your doctor will: ? Make sure you are using it  correctly. ? Make sure it is working right.  Make sure you understand what your blood pressure numbers should be.  Tell your doctor if your medicines are causing side effects. Contact a doctor if:  Your blood pressure keeps being high. Get help right away if:  Your first blood pressure number is higher than 180.  Your second blood pressure number is higher than 120. This information is not intended to replace advice given to you by your health care provider. Make sure you discuss any questions you have with your health care provider. Document Released: 01/10/2008 Document Revised: 12/26/2015 Document Reviewed: 07/06/2015 Elsevier Interactive Patient Education  2019 [employer].

## 2018-05-03 NOTE — Progress Notes (Signed)
Established Patient Office Visit  Subjective:  Patient ID: Eugene Gomez, male    DOB: Nov 27, 1980  Age: 38 y.o. MRN: [id number]  CC:  Chief Complaint  Patient presents with  . Hypertension    follow up doesnt like meds makes him cough   . 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 Wt Readings from Last 3 Encounters:  05/03/18 246 lb 12.8 oz (111.9 kg)  03/23/18 249 lb 12.8 oz (113.3 kg)  12/03/17 245 lb 12.8 oz (111.5 kg)    Dermatitis of the scalp He reports dry flakes He is taking betamethasone He saw dermatology   Past Medical History:  Diagnosis Date  . Hypertension     Past Surgical History:  Procedure Laterality Date  . DENTAL SURGERY Left   . WISDOM TOOTH EXTRACTION      Family History  Problem Relation Age of Onset  . Hypertension Mother   . Anemia Mother   . Hypertension Father   . Diabetes Father   . Hypertension Brother   . Drug abuse Brother   . Cancer Sister        brain  . Hypertension Maternal Aunt   . Hypertension Maternal Uncle   . Hypertension Paternal Aunt   . Glaucoma Paternal Aunt   . Diabetes Paternal Aunt   . Hypertension Paternal Uncle   . Diabetes Paternal Uncle     Social History   Socioeconomic History  . Marital status: Married    Spouse name: Therapist, occupational  . Number of children: 2  . Years of  education: Associates  . Highest education level: Not on file  Occupational History  . 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 Occupation: Night Job    Comment: [employer]  Social Needs  . Financial resource strain: Not on file  . Food insecurity:    Worry: Not on file    Inability: Not on file  . Transportation needs:    Medical: Not on file    Non-medical: Not on file  Tobacco Use  . Smoking status: Former Smoker    Types: Cigars  . Smokeless tobacco: Never Used  . Tobacco comment: MAYBE 5 OR 6 DAYS  Substance and Sexual Activity  . Alcohol use: Yes    Alcohol/week: 1.0 - 10.0 standard drinks    Types: 1 - 10 Standard drinks or equivalent per week    Comment: MIXED DRINKS  . Drug use: No  . Sexual activity: Yes    Partners: Female  Lifestyle  . Physical activity:    Days per week: Not on file    Minutes per session: Not on file  . Stress: Not on file  Relationships  .  Social connections:    Talks on phone: Not on file    Gets together: Not on file    Attends religious service: Not on file    Active member of club or organization: Not on file    Attends meetings of clubs or organizations: Not on file    Relationship status: Not on file  . Intimate partner violence:    Fear of current or ex partner: Not on file    Emotionally abused: Not on file    Physically abused: Not on file    Forced sexual activity: Not on file  Other Topics Concern  . Not on file  Social History Narrative   Lives with his wife and two daughters.    Outpatient Medications Prior to Visit  Medication Sig Dispense Refill  . fluticasone (FLONASE) 50 MCG/ACT nasal spray Place 2 sprays into both nostrils daily. 16 g 12  . hydrocortisone 2.5 % cream APPLY TO AFFECTED AREA TWICE A DAY AS NEEDED ON FACE  2  . pantoprazole (PROTONIX) 40 MG tablet TAKE 1 TABLET BY MOUTH EVERY DAY 30 tablet 0  . Vitamin D, Ergocalciferol, (DRISDOL) 50000  units CAPS capsule Take 1 capsule (50,000 Units total) by mouth every 7 (seven) days. 12 capsule 3  . betamethasone dipropionate 0.05 % lotion APPLY TOPICALLY 2 (TWO) TIMES DAILY. STOP USE X 1 WEEK AFTER EVERY 2 WEEKS OF DAILY USE. 60 mL 0  . lisinopril-hydrochlorothiazide (PRINZIDE,ZESTORETIC) 20-25 MG tablet Take 1 tablet by mouth daily. 90 tablet 0  . ketoconazole (NIZORAL) 2 % shampoo APPLY 1 APPLICATION TOPICALLY 2 (TWO) TIMES A WEEK. (Patient not taking: Reported on 05/03/2018) 120 mL 0  . predniSONE (DELTASONE) 10 MG tablet Take 10 mg by mouth daily with breakfast.     No facility-administered medications prior to visit.     Allergies  Allergen Reactions  . Aspirin Hives    ROS Review of Systems Review of Systems  Constitutional: Negative for activity change, appetite change, chills and fever.  HENT: Negative for congestion, nosebleeds, trouble swallowing and voice change.   Respiratory: Negative for cough, shortness of breath and wheezing.   Gastrointestinal: Negative for diarrhea, nausea and vomiting.  Genitourinary: Negative for difficulty urinating, dysuria, flank pain and hematuria.  Musculoskeletal: Negative for back pain, joint swelling and neck pain.  Neurological: Negative for dizziness, speech difficulty, light-headedness and numbness.  See HPI. All other review of systems negative.     Objective:    Physical Exam  BP (!) 162/100   Pulse 79   Temp 98.3 F (36.8 C) (Oral)   Resp 18   Ht 6' (1.829 m)   Wt 246 lb 12.8 oz (111.9 kg)   SpO2 99%   BMI 33.47 kg/m  Wt Readings from Last 3 Encounters:  05/03/18 246 lb 12.8 oz (111.9 kg)  03/23/18 249 lb 12.8 oz (113.3 kg)  12/03/17 245 lb 12.8 oz (111.5 kg)   Physical Exam  Constitutional: Oriented to person, place, and time. Appears well-developed and well-nourished.  HENT:  Head: Normocephalic and atraumatic.  Eyes: Conjunctivae and EOM are normal.  Cardiovascular: Normal rate, regular rhythm, normal heart  sounds and intact distal pulses.  No murmur heard. Pulmonary/Chest: Effort normal and breath sounds normal. No stridor. No respiratory distress. Has no wheezes.  Neurological: Is alert and oriented to person, place, and time.  Skin: Skin is warm. Capillary refill takes less than 2 seconds.  Psychiatric: Has a normal mood and affect. Behavior is normal. Judgment and  thought content normal.    There are no preventive care reminders to display for this patient.  There are no preventive care reminders to display for this patient.  Lab Results  Component Value Date   TSH 0.643 07/29/2017   Lab Results  Component Value Date   WBC 6.2 07/29/2017   HGB 14.9 07/29/2017   HCT 45.1 07/29/2017   MCV 76 (L) 07/29/2017   PLT 334 07/29/2017   Lab Results  Component Value Date   NA 141 03/23/2018   K 4.2 03/23/2018   CO2 25 03/23/2018   GLUCOSE 83 03/23/2018   BUN 11 03/23/2018   CREATININE 1.13 03/23/2018   BILITOT 0.2 03/23/2018   ALKPHOS 104 03/23/2018   AST 20 03/23/2018   ALT 33 03/23/2018   PROT 7.3 03/23/2018   ALBUMIN 4.4 03/23/2018   CALCIUM 10.2 03/23/2018   Lab Results  Component Value Date   CHOL 156 03/23/2018   Lab Results  Component Value Date   HDL 45 03/23/2018   Lab Results  Component Value Date   LDLCALC 81 03/23/2018   Lab Results  Component Value Date   TRIG 152 (H) 03/23/2018   Lab Results  Component Value Date   CHOLHDL 3.5 03/23/2018   Lab Results  Component Value Date   HGBA1C 5.5 07/29/2017      Assessment & Plan:   Problem List Items Addressed This Visit      Musculoskeletal and Integument   Seborrhea capitis (Chronic)  -  Will refill steroid    Relevant Medications   betamethasone dipropionate 0.05 % lotion    Other Visit Diagnoses    Uncontrolled hypertension    -  Primary   Relevant Medications   triamterene-hydrochlorothiazide (MAXZIDE-25) 37.5-25 MG tablet   verapamil (CALAN-SR) 120 MG CR tablet   Dependent edema    -   Will continue hczt   Persistent dry cough    -  Will stop lisinopril   ACE-inhibitor cough    -  Changed lisinopril       Meds ordered this encounter  Medications  . triamterene-hydrochlorothiazide (MAXZIDE-25) 37.5-25 MG tablet    Sig: Take 1 tablet by mouth daily.    Dispense:  90 tablet    Refill:  1  . verapamil (CALAN-SR) 120 MG CR tablet    Sig: Take 1 tablet (120 mg total) by mouth at bedtime.    Dispense:  90 tablet    Refill:  1  . betamethasone dipropionate 0.05 % lotion    Sig: Apply topically 2 (two) times daily. Stop use x 1 week after every 2 weeks of daily use.    Dispense:  60 mL    Refill:  0    Follow-up: No follow-ups on file.    Doristine BosworthZoe A , MD

## 2018-05-14 ENCOUNTER — Other Ambulatory Visit: Payer: Self-pay | Admitting: Family Medicine

## 2018-05-14 DIAGNOSIS — R12 Heartburn: Secondary | ICD-10-CM

## 2018-05-18 ENCOUNTER — Other Ambulatory Visit: Payer: Self-pay | Admitting: Family Medicine

## 2018-05-18 DIAGNOSIS — J3089 Other allergic rhinitis: Secondary | ICD-10-CM

## 2018-05-18 NOTE — Telephone Encounter (Signed)
Requested Prescriptions  Pending Prescriptions Disp Refills  . fluticasone (FLONASE) 50 MCG/ACT nasal spray [Pharmacy Med Name: FLUTICASONE PROP 50 MCG SPRAY] 16 g 12    Sig: SPRAY 2 SPRAYS INTO EACH NOSTRIL EVERY DAY     Ear, Nose, and Throat: Nasal Preparations - Corticosteroids Passed - 05/18/2018  4:50 PM      Passed - Valid encounter within last 12 months    Recent Outpatient Visits          2 weeks ago Uncontrolled hypertension   Primary Care at [facility], Manus Rudd, MD   1 month ago Essential hypertension   Primary Care at [facility], Manus Rudd, MD   5 months ago Essential hypertension   Primary Care at [facility], Manus Rudd, MD   8 months ago Essential hypertension   Primary Care at [facility], Manus Rudd, MD   9 months ago Essential hypertension   Primary Care at [facility], Manus Rudd, MD      Future Appointments            In 1 month Doristine Bosworth, MD Primary Care at [place], [facility]

## 2018-05-25 ENCOUNTER — Other Ambulatory Visit: Payer: Self-pay | Admitting: Family Medicine

## 2018-05-25 ENCOUNTER — Other Ambulatory Visit: Payer: Self-pay

## 2018-05-25 ENCOUNTER — Telehealth (INDEPENDENT_AMBULATORY_CARE_PROVIDER_SITE_OTHER): Payer: 59 | Admitting: Family Medicine

## 2018-05-25 DIAGNOSIS — R05 Cough: Secondary | ICD-10-CM | POA: Diagnosis not present

## 2018-05-25 DIAGNOSIS — R059 Cough, unspecified: Secondary | ICD-10-CM | POA: Insufficient documentation

## 2018-05-25 DIAGNOSIS — I10 Essential (primary) hypertension: Secondary | ICD-10-CM

## 2018-05-25 DIAGNOSIS — R609 Edema, unspecified: Secondary | ICD-10-CM

## 2018-05-25 MED ORDER — CETIRIZINE HCL 10 MG PO TABS: 10.0000 mg | ORAL_TABLET | Freq: Every day | ORAL | 11 refills | Status: DC

## 2018-05-25 NOTE — Progress Notes (Signed)
Spoke with pt about concerns today and he states he has been having a dry cough since January. Pt states he does not have any other symptoms. He has tried OTC medication that is not working.

## 2018-05-25 NOTE — Progress Notes (Signed)
Telemedicine Encounter- SOAP NOTE Established Patient  I discussed the limitations, risks, security and privacy concerns of performing an evaluation and management service by telephone and the availability of in person appointments. I also discussed with the patient that there may be a patient responsible charge related to this service. The patient expressed understanding and agreed to proceed.  This telephone encounter was conducted with the patient's  verbal consent via audio telecommunications: yes-unable to link video-I was able to see pt but he could not hear me in clinic.Contacted by telephone Patient was instructed to have this encounter in a suitably private space; and to only have persons present to whom they give permission to participate. In addition, patient identity was confirmed by use of name plus two identifiers (DOB and address).  I spent a total of 25min talking with the patient or their proxy.  Spoke with pt about concerns today and he states he has been having a dry cough since January. Pt states he does not have any other symptoms. He has tried OTC medication that is not working.   Eugene Gomez is a 38 y.o. male established patient. Telephone visit today for dry cough  HPI pt states continued dry cough-seen by Dr. Creta LevinStallings with change in blood pressure medication from lisinopril to [facility]. Pt states some improvement but cough continues-pt using otc cough meds with some relief. Pt states mucous noted with cough 1-2 times a day otherwise dry. Worse during the day-better at night. 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  Pt states reflux stable with protonix-uses additional otc meds 1-2 times a week. Pt and wife walk in the evenings after work for exercise. Pt denies asthma-h/o allergies worsening in last few years. Pt uses flonase in the morning. Pt used wife's zyrtec once but unsure if symptoms improved.  bp improved when checked at pharmacy. No headaches or LE  swelling.   Patient Active Problem List   Diagnosis Date Noted  . Heartburn 11/02/2016  . Tinea versicolor 10/28/2016  . Former smoker 05/21/2016  . Allergic rhinitis 05/21/2016  . BMI 33.0-33.9,adult 07/17/2015  . Hepatitis B immune 10/26/2014  . Seborrhea capitis 08/24/2014  . Folliculitis 08/24/2014  . Excessive sweating 08/24/2014  . Acute meniscal tear of left knee 08/24/2014  . Left shoulder pain 08/24/2014  . Hypertension 07/02/2011    Past Medical History:  Diagnosis Date  . Hypertension     Current Outpatient Medications  Medication Sig Dispense Refill  . betamethasone dipropionate 0.05 % lotion Apply topically 2 (two) times daily. Stop use x 1 week after every 2 weeks of daily use. 60 mL 0  . fluticasone (FLONASE) 50 MCG/ACT nasal spray SPRAY 2 SPRAYS INTO EACH NOSTRIL EVERY DAY 16 g 12  . hydrocortisone 2.5 % cream APPLY TO AFFECTED AREA TWICE A DAY AS NEEDED ON FACE  2  . pantoprazole (PROTONIX) 40 MG tablet TAKE 1 TABLET BY MOUTH EVERY DAY 30 tablet 0  . triamterene-hydrochlorothiazide (MAXZIDE-25) 37.5-25 MG tablet Take 1 tablet by mouth daily. 90 tablet 1  . verapamil (CALAN-SR) 120 MG CR tablet Take 1 tablet (120 mg total) by mouth at bedtime. 90 tablet 1  . Vitamin D, Ergocalciferol, (DRISDOL) 50000 units CAPS capsule Take 1 capsule (50,000 Units total) by mouth every 7 (seven) days. 12 capsule 3   No current facility-administered medications for this visit.     Allergies  Allergen Reactions  . Aspirin Hives    Social History   Socioeconomic  History  . Marital status: Married    Spouse name: Therapist, occupational  . Number of children: 2  . Years of education: Associates  . Highest education level: Not on file  Occupational History  . 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 Occupation: Night Job    Comment: [employer]  Social Needs  . Financial resource strain: Not on file  . Food insecurity:     Worry: Not on file    Inability: Not on file  . Transportation needs:    Medical: Not on file    Non-medical: Not on file  Tobacco Use  . Smoking status: Former Smoker    Types: Cigars  . Smokeless tobacco: Never Used  . Tobacco comment: MAYBE 5 OR 6 DAYS  Substance and Sexual Activity  . Alcohol use: Yes    Alcohol/week: 1.0 - 10.0 standard drinks    Types: 1 - 10 Standard drinks or equivalent per week    Comment: MIXED DRINKS  . Drug use: No  . Sexual activity: Yes    Partners: Female  Lifestyle  . Physical activity:    Days per week: Not on file    Minutes per session: Not on file  . Stress: Not on file  Relationships  . Social connections:    Talks on phone: Not on file    Gets together: Not on file    Attends religious service: Not on file    Active member of club or organization: Not on file    Attends meetings of clubs or organizations: Not on file    Relationship status: Not on file  . Intimate partner violence:    Fear of current or ex partner: Not on file    Emotionally abused: Not on file    Physically abused: Not on file    Forced sexual activity: Not on file  Other Topics Concern  . Not on file  Social History Narrative   Lives with his wife and two daughters.    ROS CONSTITUTIONAL: no fever EENT:no , sinus problems, + nasal congestion-worse during the day, no sore throat CV: no chest pain RESP: no SOB, cough-dry, no wheezing, occasional sputum production GI:  Heartburn NEURO:no headaches,  ALLERGY:seasonal allergies,  Objective   Vitals as reported by the patient: bp checked at pharmacy 130's/80's, bp broken at home  1. Cough Zyrtec trial-? Allergy as worse during the day and better at night. rx sent to pharmacy. D/w pt if worsening symptoms-face to face visit for cxr. Former smoker.   I discussed the assessment and treatment plan with the patient. The patient was provided an opportunity to ask questions and all were answered. The patient  agreed with the plan and demonstrated an understanding of the instructions.   The patient was advised to call back or seek an in-person evaluation if the symptoms worsen or if the condition fails to improve as anticipated.  I provided 25 minutes of non-face-to-face time during this encounter.  Eugene Barton Mat Carne, MD  Primary Care at [facility] @DATE @

## 2018-06-10 ENCOUNTER — Other Ambulatory Visit: Payer: Self-pay | Admitting: Family Medicine

## 2018-06-10 DIAGNOSIS — R12 Heartburn: Secondary | ICD-10-CM

## 2018-06-23 ENCOUNTER — Telehealth: Payer: Self-pay | Admitting: Family Medicine

## 2018-07-02 ENCOUNTER — Ambulatory Visit: Payer: 59 | Admitting: Family Medicine

## 2018-07-12 ENCOUNTER — Other Ambulatory Visit: Payer: Self-pay | Admitting: Family Medicine

## 2018-07-12 DIAGNOSIS — R12 Heartburn: Secondary | ICD-10-CM

## 2018-07-20 ENCOUNTER — Ambulatory Visit (INDEPENDENT_AMBULATORY_CARE_PROVIDER_SITE_OTHER): Payer: 59 | Admitting: Family Medicine

## 2018-07-20 ENCOUNTER — Other Ambulatory Visit: Payer: Self-pay

## 2018-07-20 ENCOUNTER — Encounter: Payer: Self-pay | Admitting: Family Medicine

## 2018-07-20 VITALS — BP 160/110 | HR 74 | Temp 99.2°F | Ht 72.0 in | Wt 248.8 lb

## 2018-07-20 DIAGNOSIS — I10 Essential (primary) hypertension: Secondary | ICD-10-CM

## 2018-07-20 MED ORDER — VERAPAMIL HCL ER 240 MG PO TBCR: 240.0000 mg | EXTENDED_RELEASE_TABLET | Freq: Every day | ORAL | 0 refills | Status: DC

## 2018-07-20 NOTE — Patient Instructions (Addendum)
If you have lab work done today you will be contacted with your lab results within the next 2 weeks.  If you have not heard from [place] then please contact us. The fastest way to get your results is to register for My Chart.   IF you received an x-ray today, you will receive an invoice from [facility]. Please contact [facility] at [phone] with questions or concerns regarding your invoice.   IF you received labwork today, you will receive an invoice from [place]. Please contact [facility] at [phone] with questions or concerns regarding your invoice.   Our billing staff will not be able to assist you with questions regarding bills from these companies.  You will be contacted with the lab results as soon as they are available. The fastest way to get your results is to activate your My Chart account. Instructions are located on the last page of this paperwork. If you have not heard from [place] regarding the results in 2 weeks, please contact this office.     Exercise Stress Test An exercise stress test is a test to check how your heart works during exercise. You will need to walk on a treadmill or ride an exercise bike for this test. An electrocardiogram (ECG) will record your heartbeat when you are at rest and when you are exercising. You may have an ultrasound or nuclear test after the exercise test. The test is done to check for coronary artery disease (CAD). It is also done to:  See how well you can exercise.  Watch for high blood pressure during exercise.  Test how well you can exercise after treatment.  Check the blood flow to your arms and legs. If your test result is not normal, more testing may be needed. What happens before the procedure?  Follow instructions from your doctor about what you cannot eat or drink. ? Do not have any drinks or foods that have caffeine in them for 24 hours before the test, or as told by your doctor. This includes coffee, tea  (even decaf tea), sodas, chocolate, and cocoa.  Ask your doctor about changing or stopping your normal medicines. This is important if you: ? Take diabetes medicines. ? Take beta-blocker medicines. ? Wear a nitroglycerin patch.  If you use an inhaler, bring it with you to the test.  Do not put lotions, powders, creams, or oils on your chest before the test.  Wear comfortable shoes and clothing.  Do not use any products that have nicotine or tobacco in them, such as cigarettes and e-cigarettes. Stop using them at least 4 hours before the test. If you need help quitting, ask your doctor. What happens during the procedure?  Patches (electrodes) will be put on your chest.  Wires will be connected to the patches. The wires will send signals to a machine to record your heartbeat.  Your heart rate will be watched while you are resting and while you are exercising. Your blood pressure will also be watched during the test.  You will walk on a treadmill or use a stationary bike. If you cannot use these, you may be asked to turn a crank with your hands.  The activity will get harder and will raise your heart rate.  You may be asked to breathe into a tube a few times during the test. This measures the gases that you breathe out.  You will be asked how you are feeling throughout the test.  You will  exercise until your heart reaches a target heart rate. You will stop early if: ? You feel dizzy. ? You have chest pain. ? You are out of breath. ? Your blood pressure is too high or too low. ? You have an irregular heartbeat. ? You have pain or aching in your arms or legs. The procedure may vary among doctors and hospitals. What happens after the procedure?  Your blood pressure, heart rate, breathing rate, and blood oxygen level will be watched after the test.  You may return to your normal diet and activities as told by your doctor.  It is up to you to get the results of your test. Ask your  doctor, or the department that is doing the test, when your results will be ready. Summary  An exercise stress test is a test to check how your heart works during exercise.  This test is done to check for coronary artery disease.  Your heart rate will be watched while you are resting and while you are exercising.  Follow instructions from your doctor about what you cannot eat or drink before the test. This information is not intended to replace advice given to you by your health care provider. Make sure you discuss any questions you have with your health care provider. Document Released: 07/16/2007 Document Revised: 04/29/2016 Document Reviewed: 04/29/2016 Elsevier Interactive Patient Education  2019 [employer]

## 2018-07-20 NOTE — Progress Notes (Signed)
Established Patient Office Visit  Subjective:  Patient ID: Eugene Gomez, male    DOB: Mar 30, 1980  Age: 38 y.o. MRN: [id number]  CC:  Chief Complaint  Patient presents with  . Hypertension    F/U     HPI Eugene Fossalonzo Ressel presents for   Hypertension: Patient here for follow-up of elevated blood pressure. He is exercising by cutting grass and is adherent to low salt diet.  Blood pressure is well controlled at home. Cardiac symptoms dyspnea and exertional chest pressure/discomfort. Patient denies chest pain, claudication, irregular heart beat, lower extremity edema and palpitations.  Cardiovascular risk factors: hypertension, male gender and obesity (BMI >= 30 kg/m2). Use of agents associated with hypertension: none. History of target organ damage: none.  He has a bp monitor at home and his readings were elevated >160-170 He checked at work and his readings were better 140/90s He reports that when he climbs the stairs he gets very tired. He reports that he eats grilled chicken salads and teriyaki chicken.   BP Readings from Last 3 Encounters:  07/20/18 (!) 160/110  05/03/18 (!) 162/100  03/23/18 (!) 158/100   Wt Readings from Last 3 Encounters:  07/20/18 248 lb 12.8 oz (112.9 kg)  05/03/18 246 lb 12.8 oz (111.9 kg)  03/23/18 249 lb 12.8 oz (113.3 kg)    Past Medical History:  Diagnosis Date  . Hypertension     Past Surgical History:  Procedure Laterality Date  . DENTAL SURGERY Left   . WISDOM TOOTH EXTRACTION      Family History  Problem Relation Age of Onset  . Hypertension Mother   . Anemia Mother   . Hypertension Father   . Diabetes Father   . Hypertension Brother   . Drug abuse Brother   . Cancer Sister        brain  . Hypertension Maternal Aunt   . Hypertension Maternal Uncle   . Hypertension Paternal Aunt   . Glaucoma Paternal Aunt   . Diabetes Paternal Aunt   . Hypertension Paternal Uncle   . Diabetes Paternal Uncle     Social History   Socioeconomic  History  . Marital status: Married    Spouse name: Therapist, occupationalChassity Gomez  . Number of children: 2  . Years of education: Associates  . Highest education level: Not on file  Occupational History  . Occupation: Heavy Arts development officerquipment Operator    Employer: [employer]    Comment: (trash truck driver)  . Occupation: Night Job    Comment: [employer]  Social Needs  . Financial resource strain: Not on file  . Food insecurity:    Worry: Not on file    Inability: Not on file  . Transportation needs:    Medical: Not on file    Non-medical: Not on file  Tobacco Use  . Smoking status: Former Smoker    Types: Cigars  . Smokeless tobacco: Never Used  . Tobacco comment: MAYBE 5 OR 6 DAYS  Substance and Sexual Activity  . Alcohol use: Yes    Alcohol/week: 1.0 - 10.0 standard drinks    Types: 1 - 10 Standard drinks or equivalent per week    Comment: MIXED DRINKS  . Drug use: No  . Sexual activity: Yes    Partners: Female  Lifestyle  . Physical activity:    Days per week: Not on file    Minutes per session: Not on file  . Stress: Not on file  Relationships  . Social connections:  Talks on phone: Not on file    Gets together: Not on file    Attends religious service: Not on file    Active member of club or organization: Not on file    Attends meetings of clubs or organizations: Not on file    Relationship status: Not on file  . Intimate partner violence:    Fear of current or ex partner: Not on file    Emotionally abused: Not on file    Physically abused: Not on file    Forced sexual activity: Not on file  Other Topics Concern  . Not on file  Social History Narrative   Lives with his wife and two daughters.    Outpatient Medications Prior to Visit  Medication Sig Dispense Refill  . betamethasone dipropionate 0.05 % lotion Apply topically 2 (two) times daily. Stop use x 1 week after every 2 weeks of daily use. 60 mL 0  . fluticasone (FLONASE) 50 MCG/ACT nasal spray SPRAY 2 SPRAYS INTO  EACH NOSTRIL EVERY DAY 16 g 12  . hydrocortisone 2.5 % cream APPLY TO AFFECTED AREA TWICE A DAY AS NEEDED ON FACE  2  . pantoprazole (PROTONIX) 40 MG tablet TAKE 1 TABLET BY MOUTH EVERY DAY 30 tablet 0  . triamterene-hydrochlorothiazide (MAXZIDE-25) 37.5-25 MG tablet Take 1 tablet by mouth daily. 90 tablet 1  . verapamil (CALAN-SR) 120 MG CR tablet Take 1 tablet (120 mg total) by mouth at bedtime. 90 tablet 1  . Vitamin D, Ergocalciferol, (DRISDOL) 50000 units CAPS capsule Take 1 capsule (50,000 Units total) by mouth every 7 (seven) days. 12 capsule 3  . cetirizine (ZYRTEC) 10 MG tablet Take 1 tablet (10 mg total) by mouth daily. (Patient not taking: Reported on 07/20/2018) 30 tablet 11   No facility-administered medications prior to visit.     Allergies  Allergen Reactions  . Aspirin Hives    ROS Review of Systems Review of Systems  Constitutional: Negative for activity change, appetite change, chills and fever.  HENT: Negative for congestion, nosebleeds, trouble swallowing and voice change.   Respiratory: Negative for cough, shortness of breath and wheezing.   Gastrointestinal: Negative for diarrhea, nausea and vomiting.  Genitourinary: Negative for difficulty urinating, dysuria, flank pain and hematuria.  Musculoskeletal: Negative for back pain, joint swelling and neck pain.  Neurological: Negative for dizziness, speech difficulty, light-headedness and numbness.  See HPI. All other review of systems negative.     Objective:    Physical Exam  BP (!) 160/110   Pulse 74   Temp 99.2 F (37.3 C) (Oral)   Ht 6' (1.829 m)   Wt 248 lb 12.8 oz (112.9 kg)   SpO2 95%   BMI 33.74 kg/m  Wt Readings from Last 3 Encounters:  07/20/18 248 lb 12.8 oz (112.9 kg)  05/03/18 246 lb 12.8 oz (111.9 kg)  03/23/18 249 lb 12.8 oz (113.3 kg)   Physical Exam  Constitutional: Oriented to person, place, and time. Appears well-developed and well-nourished.  HENT:  Head: Normocephalic and  atraumatic.  Eyes: Conjunctivae and EOM are normal.  Cardiovascular: Normal rate, regular rhythm, normal heart sounds and intact distal pulses.  No murmur heard. Pulmonary/Chest: Effort normal and breath sounds normal. No stridor. No respiratory distress. Has no wheezes.  Neurological: Is alert and oriented to person, place, and time.  Skin: Skin is warm. Capillary refill takes less than 2 seconds.  Psychiatric: Has a normal mood and affect. Behavior is normal. Judgment and thought content normal.  ECG - normal sinus rhythm, no twi, no st elevation     There are no preventive care reminders to display for this patient.  There are no preventive care reminders to display for this patient.  Lab Results  Component Value Date   TSH 0.643 07/29/2017   Lab Results  Component Value Date   WBC 6.2 07/29/2017   HGB 14.9 07/29/2017   HCT 45.1 07/29/2017   MCV 76 (L) 07/29/2017   PLT 334 07/29/2017   Lab Results  Component Value Date   NA 141 03/23/2018   K 4.2 03/23/2018   CO2 25 03/23/2018   GLUCOSE 83 03/23/2018   BUN 11 03/23/2018   CREATININE 1.13 03/23/2018   BILITOT 0.2 03/23/2018   ALKPHOS 104 03/23/2018   AST 20 03/23/2018   ALT 33 03/23/2018   PROT 7.3 03/23/2018   ALBUMIN 4.4 03/23/2018   CALCIUM 10.2 03/23/2018   Lab Results  Component Value Date   CHOL 156 03/23/2018   Lab Results  Component Value Date   HDL 45 03/23/2018   Lab Results  Component Value Date   LDLCALC 81 03/23/2018   Lab Results  Component Value Date   TRIG 152 (H) 03/23/2018   Lab Results  Component Value Date   CHOLHDL 3.5 03/23/2018   Lab Results  Component Value Date   HGBA1C 5.5 07/29/2017      Assessment & Plan:   Problem List Items Addressed This Visit    None    Visit Diagnoses    Uncontrolled hypertension    -  Primary   Relevant Orders   Ambulatory referral to Cardiology   EKG 12-Lead     Will refer to Cardiology for bp mgmt and stress testing Discussed  DASH DIET He should increase the verapamil to 240mg  Discussed that he might experience ED from the bp medication and that this should not cause him to stop his medication. Will prescribed cialis if necessary.   No orders of the defined types were placed in this encounter.   Follow-up: No follow-ups on file.   A total of 30 minutes were spent face-to-face with the patient during this encounter and over half of that time was spent on counseling and coordination of care.   Forrest Moron, MD

## 2018-08-12 ENCOUNTER — Other Ambulatory Visit: Payer: Self-pay | Admitting: Family Medicine

## 2018-08-12 DIAGNOSIS — R12 Heartburn: Secondary | ICD-10-CM

## 2018-08-14 ENCOUNTER — Other Ambulatory Visit: Payer: Self-pay | Admitting: Family Medicine

## 2018-08-14 NOTE — Telephone Encounter (Signed)
Forwarding medication refill to PCP for review. 

## 2018-08-18 ENCOUNTER — Ambulatory Visit: Payer: Self-pay | Admitting: Cardiology

## 2018-08-20 ENCOUNTER — Other Ambulatory Visit: Payer: Self-pay | Admitting: Family Medicine

## 2018-08-20 DIAGNOSIS — L210 Seborrhea capitis: Secondary | ICD-10-CM

## 2018-08-20 NOTE — Telephone Encounter (Signed)
Forwarding medication refill to PCP for review. 

## 2018-08-24 ENCOUNTER — Encounter: Payer: Self-pay | Admitting: Cardiology

## 2018-08-26 ENCOUNTER — Ambulatory Visit: Payer: 59 | Admitting: Cardiology

## 2018-08-26 ENCOUNTER — Encounter: Payer: Self-pay | Admitting: Cardiology

## 2018-08-26 ENCOUNTER — Other Ambulatory Visit: Payer: Self-pay

## 2018-08-26 VITALS — BP 193/118 | HR 82 | Ht 72.0 in | Wt 246.0 lb

## 2018-08-26 DIAGNOSIS — I10 Essential (primary) hypertension: Secondary | ICD-10-CM

## 2018-08-26 MED ORDER — CARVEDILOL 6.25 MG PO TABS: 6.2500 mg | ORAL_TABLET | Freq: Two times a day (BID) | ORAL | 3 refills | Status: DC

## 2018-08-26 MED ORDER — SPIRONOLACTONE 50 MG PO TABS: 50.0000 mg | ORAL_TABLET | Freq: Every day | ORAL | 3 refills | Status: DC

## 2018-08-26 NOTE — Progress Notes (Signed)
Patient referred by Forrest Moron, MD for treatment resistant HTN   Subjective:   Eugene Gomez, male    DOB: Sep 18, 1980, 38 y.o.   MRN: [id number]   Chief Complaint  Patient presents with  . Hypertension  . New Patient (Initial Visit)     HPI  38 y.o. AA male with PMH sig for treatment resistant HTN, obesity.  He presents today with high blood pressure that has been difficult to control.  He originally was started on amlodipine titrated up to 10mg  with some improvement however could not tolerate this due to lower extremity edema.  He was then switched to lisinopril hctz however developed a cough and was then switched to triamterene hctz which has been titrated up to 37.5-25 and verapamil was added.  Despite this he reports he has still had labile blood pressures.  He thinks at his last visit with his pcp was 140's however based on chart review it was 160/110 at that time verapamil increased to 240mg .  He does report some occasional daytime fatigue and snores very loudly according to his wife and kids.   His wife has not told him about any apneic episodes.   He is very active, works 3 jobs and tries to walk a few miles at night when he gets home.  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  He never has to stop and take a rest while pushing the mower up the hill and overall reports good exercise capacity.  He has no chest pain at rest or with exertion.     Past Medical History:  Diagnosis Date  . Hypertension    Past Surgical History:  Procedure Laterality Date  . DENTAL SURGERY Left   . WISDOM TOOTH EXTRACTION     Social History   Socioeconomic History  . Marital status: Married    Spouse name: Radiographer, therapeutic  . Number of children: 2  . Years of education: Associates  . Highest education level: Not on file  Occupational History  . 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 Occupation: Night Job    Comment: [employer]  Social Needs  . Financial resource strain: Not on file  . Food insecurity    Worry: Not on file    Inability: Not on file  . Transportation needs    Medical: Not on file    Non-medical: Not on file  Tobacco Use  . Smoking status: Former Smoker    Years: 10.00    Types: Cigars    Quit date: 2018    Years since quitting: 2.5  . Smokeless tobacco: Never Used  . Tobacco comment: smoked black and milds 1 a day   Substance and Sexual Activity  . Alcohol use: Yes    Alcohol/week: 1.0 - 10.0 standard drinks    Types: 1 - 10 Standard drinks or equivalent per week    Comment: MIXED DRINKS  . Drug use: No  . Sexual activity: Yes    Partners: Female  Lifestyle  . Physical activity    Days per week: Not on file    Minutes per session: Not on file  . Stress: Not on file  Relationships  . Social Herbalist on phone: Not on file    Gets together: Not on file  Attends religious service: Not on file    Active member of club or organization: Not on file    Attends meetings of clubs or organizations: Not on file    Relationship status: Not on file  . Intimate partner violence    Fear of current or ex partner: Not on file    Emotionally abused: Not on file    Physically abused: Not on file    Forced sexual activity: Not on file  Other Topics Concern  . Not on file  Social History Narrative   Lives with his wife and two daughters.    Quit smoking 2 years ago only smoked occasionally before that Will have a cocktail once a week socially   Family History  Problem Relation Age of Onset  . Hypertension Mother   . Anemia Mother   . Hypertension Father   . Diabetes Father   . Hypertension Brother   . Drug abuse Brother   . Cancer Sister        brain  . Hypertension Maternal Aunt   . Hypertension Maternal Uncle   . Hypertension Paternal Aunt   . Glaucoma Paternal Aunt   . Diabetes Paternal Aunt   .  Hypertension Paternal Uncle   . Diabetes Paternal Uncle      Current Outpatient Medications on File Prior to Visit  Medication Sig Dispense Refill  . betamethasone dipropionate 0.05 % lotion APPLY TOPICALLY 2 (TWO) TIMES DAILY. STOP USE X 1 WEEK AFTER EVERY 2 WEEKS OF DAILY USE. 60 mL 0  . cetirizine (ZYRTEC) 10 MG tablet Take 1 tablet (10 mg total) by mouth daily. 30 tablet 11  . fluticasone (FLONASE) 50 MCG/ACT nasal spray SPRAY 2 SPRAYS INTO EACH NOSTRIL EVERY DAY 16 g 12  . hydrocortisone 2.5 % cream APPLY TO AFFECTED AREA TWICE A DAY AS NEEDED ON FACE  2  . pantoprazole (PROTONIX) 40 MG tablet TAKE 1 TABLET BY MOUTH EVERY DAY 30 tablet 0  . verapamil (CALAN-SR) 240 MG CR tablet Take 1 tablet (240 mg total) by mouth at bedtime. 90 tablet 0   No current facility-administered medications on file prior to visit.     Cardiovascular studies:  EKG 08/26/2018:  Sinus rhythm 89 bpm.  Left ventricular hypertrophy.  Left atrial enlargement.    Recent labs: Lab Results  Component Value Date   WBC 6.2 07/29/2017   HGB 14.9 07/29/2017   HCT 45.1 07/29/2017   MCV 76 (L) 07/29/2017   PLT 334 07/29/2017   CMP     Component Value Date/Time   NA 141 03/23/2018 1132   K 4.2 03/23/2018 1132   CL 101 03/23/2018 1132   CO2 25 03/23/2018 1132   GLUCOSE 83 03/23/2018 1132   GLUCOSE 77 08/29/2015 1248   BUN 11 03/23/2018 1132   CREATININE 1.13 03/23/2018 1132   CREATININE 1.00 08/29/2015 1248   CALCIUM 10.2 03/23/2018 1132   PROT 7.3 03/23/2018 1132   ALBUMIN 4.4 03/23/2018 1132   AST 20 03/23/2018 1132   ALT 33 03/23/2018 1132   ALKPHOS 104 03/23/2018 1132   BILITOT 0.2 03/23/2018 1132   GFRNONAA 83 03/23/2018 1132   GFRAA 96 03/23/2018 1132     Review of Systems  Constitution: Negative for decreased appetite and fever.  HENT: Negative for sore throat and stridor.   Eyes: Negative for blurred vision and pain.  Cardiovascular: Negative for chest pain and dyspnea on exertion.   Respiratory: Negative for shortness of breath and wheezing.  Endocrine: Negative for polydipsia.  Hematologic/Lymphatic: Negative for bleeding problem.  Skin: Negative for dry skin and flushing.  Musculoskeletal: Negative for muscle cramps and muscle weakness.  Gastrointestinal: Negative for diarrhea and vomiting.  Genitourinary: Negative for frequency.  Neurological: Negative for dizziness and weakness.  Psychiatric/Behavioral: Negative for altered mental status. The patient is not nervous/anxious.         Vitals:   08/26/18 1453  BP: (!) 193/118  Pulse: 82  SpO2: 98%     Body mass index is 33.36 kg/m. Filed Weights   08/26/18 1453  Weight: 246 lb (111.6 kg)    Objective:   Physical Exam  Constitutional: He appears well-developed and well-nourished.  HENT:  Head: Normocephalic and atraumatic.  Eyes: Right eye exhibits no discharge. Left eye exhibits no discharge.  Cardiovascular: Normal rate and regular rhythm. Exam reveals no friction rub.  No murmur heard. Pulmonary/Chest: Effort normal and breath sounds normal. He has no wheezes. He has no rales.  Abdominal: He exhibits no distension. There is no abdominal tenderness.  Musculoskeletal:        General: Edema (1+ LE edema bilaterally) present.  Neurological: He is alert.  Skin: Skin is warm and dry.  Psychiatric: He has a normal mood and affect. His behavior is normal.       Assessment & Recommendations:   Essential HTN: BP markedly elevated in the office today.  Strong family history of HTN.  Currently on triamterene/hctz 37.5-25 and verapamil 240mg , reports good compliance.  STOP-BANG score of 4 high risk for OSA.    -d/c triamterene-hctz and add spironolactone 50mg  and carvedilol 6.25mg  BID -Sleep study  -encouraged weight loss through diet and exercise, diet better but still needs to cut out apple juice and sodas/salt -ECHO -labs in one week and follow up in person visit in one month -based on results of  initial workup and response to medication changes will consider further secondary HTN workup if necessary  Thank you for referring the patient to us. Please feel free to contact with any questions.  Thornell MuleBrandon Gershom Brobeck MD PGY-3 Internal Medicine

## 2018-08-27 ENCOUNTER — Encounter: Payer: Self-pay | Admitting: Cardiology

## 2018-09-01 ENCOUNTER — Ambulatory Visit: Payer: 59 | Admitting: Neurology

## 2018-09-01 ENCOUNTER — Encounter: Payer: Self-pay | Admitting: Neurology

## 2018-09-01 ENCOUNTER — Other Ambulatory Visit: Payer: Self-pay

## 2018-09-01 ENCOUNTER — Other Ambulatory Visit: Payer: Self-pay | Admitting: Cardiology

## 2018-09-01 VITALS — BP 156/111 | HR 67 | Ht 72.0 in | Wt 245.0 lb

## 2018-09-01 DIAGNOSIS — R0683 Snoring: Secondary | ICD-10-CM | POA: Diagnosis not present

## 2018-09-01 DIAGNOSIS — E669 Obesity, unspecified: Secondary | ICD-10-CM

## 2018-09-01 DIAGNOSIS — R030 Elevated blood-pressure reading, without diagnosis of hypertension: Secondary | ICD-10-CM | POA: Diagnosis not present

## 2018-09-01 DIAGNOSIS — I517 Cardiomegaly: Secondary | ICD-10-CM

## 2018-09-01 DIAGNOSIS — I10 Essential (primary) hypertension: Secondary | ICD-10-CM | POA: Diagnosis not present

## 2018-09-01 DIAGNOSIS — R51 Headache: Secondary | ICD-10-CM

## 2018-09-01 DIAGNOSIS — R519 Headache, unspecified: Secondary | ICD-10-CM

## 2018-09-01 NOTE — Progress Notes (Signed)
Subjective:    Patient ID: Eugene Gomez is a 38 y.o. male.  HPI     Eugene Age, Eugene Gomez, Eugene Gomez [facility] [address], [address] [place], Eugene Gomez  Dear Dr. Virgina Jock,   I saw your patient, Eugene Gomez, upon your kind request to my sleep clinic today for initial consultation of his sleep disorder, in particular, concern for underlying obstructive sleep apnea.  The patient is unaccompanied today.  As you know, Eugene Gomez is a 38 year old right-handed gentleman with an underlying medical history of treatment resistant hypertension, left atrial enlargement, left ventricular hypertrophy, and obesity, who reports snoring and excessive daytime somnolence.  I reviewed your office note from 08/26/2018.  His triamterene-hydrochlorothiazide was discontinued at the time and he was advised to start carvedilol and spironolactone and continue with verapamil at the time.  His Epworth sleepiness score is 5 out of 24, fatigue severity score is 13 out of 63. He has had occasional morning headaches in the past, at that time his blood pressure was higher, since his blood pressure has come down a little more, his morning headaches have dissipated.  His snoring can be loud per family.  He works for Eugene Gomez in Insurance claims handler, he also has a job as a Eugene Gomez, he essentially works 3 jobs, also as a Eugene Gomez, Eugene Gomez.  He quit smoking about 2 years ago, he drinks alcohol occasionally, sometimes in the form of hard seltzer.,  Drinks caffeine in the form of soda, an average of 16 ounce per day but is trying to cut back.  He started his new blood pressure medications about 4 days ago.  He is not aware of any family history of sleep apnea.  Bedtime is generally between 10 and 1030 and rise time between 515 and 5:30 AM.  He does not have night to night nocturia.  He lives with his wife and 2 daughters, ages 74 and 58, they have a dog in the household, stays downstairs.    His Past Medical History Is Significant For: Past Medical History:  Diagnosis Date  . Hypertension     His Past Surgical History Is Significant For: Past Surgical History:  Procedure Laterality Date  . DENTAL SURGERY Left   . WISDOM TOOTH EXTRACTION      His Family History Is Significant For: Family History  Problem Relation Gomez of Onset  . Hypertension Mother   . Anemia Mother   . Hypertension Father   . Diabetes Father   . Hypertension Brother   . Drug abuse Brother   . Cancer Sister        brain  . Hypertension Maternal Aunt   . Hypertension Maternal Uncle   . Hypertension Paternal Aunt   . Glaucoma Paternal Aunt   . Diabetes Paternal Aunt   . Hypertension Paternal Uncle   . Diabetes Paternal Uncle     His Social History Is Significant For: Social History   Socioeconomic History  . Marital status: Married    Spouse name: Eugene Gomez, Eugene Gomez  . Number of children: 2  . Years of education: Associates  . Highest education level: Not on file  Occupational History  . 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 Occupation: Night Job    Comment: [employer]  Social Needs  . Financial resource strain: Not on file  . Food insecurity    Worry: Not on file  Inability: Not on file  . Transportation needs    Medical: Not on file    Non-medical: Not on file  Tobacco Use  . Smoking status: Former Smoker    Years: 10.00    Types: Cigars    Quit date: 2018    Years since quitting: 2.5  . Smokeless tobacco: Never Used  . Tobacco comment: smoked black and milds 1 a day   Substance and Sexual Activity  . Alcohol use: Yes    Alcohol/week: 1.0 - 10.0 standard drinks    Types: 1 - 10 Standard drinks or equivalent per week    Comment: MIXED DRINKS  . Drug use: No  . Sexual activity: Yes    Partners: Female  Lifestyle  . Physical activity    Days per week: Not on file    Minutes per session: Not on file  .  Stress: Not on file  Relationships  . Social Musicianconnections    Talks on phone: Not on file    Gets together: Not on file    Attends religious service: Not on file    Active member of club or organization: Not on file    Attends meetings of clubs or organizations: Not on file    Relationship status: Not on file  Other Topics Concern  . Not on file  Social History Narrative   Lives with his wife and two daughters.    His Allergies Are:  Allergies  Allergen Reactions  . Aspirin Hives  :   His Current Medications Are:  Outpatient Encounter Medications as of 09/01/2018  Medication Sig  . betamethasone dipropionate 0.05 % lotion APPLY TOPICALLY 2 (TWO) TIMES DAILY. STOP USE X 1 WEEK AFTER EVERY 2 WEEKS OF DAILY USE.  . carvedilol (COREG) 6.25 MG tablet Take 1 tablet (6.25 mg total) by mouth 2 (two) times daily.  . cetirizine (ZYRTEC) 10 MG tablet Take 1 tablet (10 mg total) by mouth daily.  . fluticasone (FLONASE) 50 MCG/ACT nasal spray SPRAY 2 SPRAYS INTO EACH NOSTRIL EVERY DAY  . hydrocortisone 2.5 % cream APPLY TO AFFECTED AREA TWICE A DAY AS NEEDED ON FACE  . pantoprazole (PROTONIX) 40 MG tablet TAKE 1 TABLET BY MOUTH EVERY DAY  . spironolactone (ALDACTONE) 50 MG tablet Take 1 tablet (50 mg total) by mouth daily.  . verapamil (CALAN-SR) 240 MG CR tablet Take 1 tablet (240 mg total) by mouth at bedtime.   No facility-administered encounter medications on file as of 09/01/2018.   :  Review of Systems:  Out of a complete 14 point review of systems, all are reviewed and negative with the exception of these symptoms as listed below: Review of Systems  Neurological:       Pt presents today to discuss his sleep. Pt has never had a sleep study but does endorse snoring.  Epworth Sleepiness Scale 0= would never doze 1= slight chance of dozing 2= moderate chance of dozing 3= high chance of dozing  Sitting and reading: 1 Watching TV: 1 Sitting inactive in a public place (ex. Theater or  meeting): 0 As a passenger in a car for an hour without a break: 0 Lying down to rest in the afternoon: 2 Sitting and talking to someone: 0 Sitting quietly after lunch (no alcohol): 1 In a car, while stopped in traffic: 0 Total: 5     Objective:  Neurological Exam  Physical Exam Physical Examination:   Vitals:   09/01/18 0854  BP: (!) 156/111  Pulse:  67   General Examination: The patient is a very pleasant 38 y.o. male in no acute distress. He appears well-developed and well-nourished and well groomed.   HEENT: Normocephalic, atraumatic, pupils are equal, round and reactive to light and accommodation. Extraocular tracking is good without limitation to gaze excursion or nystagmus noted. Normal smooth pursuit is noted. Hearing is grossly intact. Face is symmetric with normal facial animation and normal facial sensation. Speech is clear with no dysarthria noted. There is no hypophonia. There is no lip, neck/head, jaw or voice tremor. Neck is supple with full range of passive and active motion. There are no carotid bruits on auscultation. Oropharynx exam reveals: mild mouth dryness, adequate dental hygiene and moderate airway crowding, due to Smaller airway entry, thicker soft palate, larger tongue, tonsils in place but not fully visualized, probably 1+ bilaterally, neck circumference 17-7/8 inches, Mallampati class III.  Tongue protrudes centrally in palate elevates symmetrically Minimal overbite.   Chest: Clear to auscultation without wheezing, rhonchi or crackles noted.  Heart: S1+S2+0, regular and normal without murmurs, rubs or gallops noted.   Abdomen: Soft, non-tender and non-distended with normal bowel sounds appreciated on auscultation.  Extremities: There is no pitting edema in the distal lower extremities bilaterally.  Skin: Warm and dry without trophic changes noted.  Musculoskeletal: exam reveals no obvious joint deformities, tenderness or joint swelling or erythema.    Neurologically:  Mental status: The patient is awake, alert and oriented in all 4 spheres. His immediate and remote memory, attention, language skills and fund of knowledge are appropriate. There is no evidence of aphasia, agnosia, apraxia or anomia. Speech is clear with normal prosody and enunciation. Thought process is linear. Mood is normal and affect is normal.  Cranial nerves II - XII are as described above under HEENT exam. In addition: shoulder shrug is normal with equal shoulder height noted. Motor exam: Normal bulk, strength and tone is noted. There is no drift, tremor or rebound. Romberg is negative. Reflexes are 2+ in the UEs and 1+ in the LEs. Fine motor skills and coordination: intact grossly. Cerebellar testing: No dysmetria or intention tremor on finger to nose testing. Heel to shin is unremarkable bilaterally. There is no truncal or gait ataxia.  Sensory exam: intact to light touch in the upper and lower extremities.  Gait, station and balance: He stands easily. No veering to one side is noted. No leaning to one side is noted. Posture is Gomez-appropriate and stance is narrow based. Gait shows normal stride length and normal pace. No problems turning are noted. Tandem walk is unremarkable.   Assessment and Plan:  In summary, Eugene Gomez is a very pleasant 38 y.o.-year old male with an underlying medical history of treatment resistant hypertension, left atrial enlargement, left ventricular hypertrophy, and obesity, whose history and physical exam are concerning for obstructive sleep apnea (OSA). I had a long chat with the patient about my findings and the diagnosis of OSA, its prognosis and treatment options. We talked about medical treatments, surgical interventions and non-pharmacological approaches. I explained in particular the risks and ramifications of untreated moderate to severe OSA, especially with respect to developing cardiovascular disease down the Road, including congestive  heart failure, difficult to treat hypertension, cardiac arrhythmias, or stroke. Even type 2 diabetes has, in part, been linked to untreated OSA. Symptoms of untreated OSA include daytime sleepiness, memory problems, mood irritability and mood disorder such as depression and anxiety, lack of energy, as well as recurrent headaches, especially morning headaches. We  talked about trying to maintain a healthy lifestyle in general, as well as the importance of weight control. I encouraged the patient to eat healthy, exercise daily and keep well hydrated, to keep a scheduled bedtime and wake time routine, to not skip any meals and eat healthy snacks in between meals. I advised the patient not to drive when feeling sleepy. I recommended the following at this time: sleep study.   I explained the sleep test procedure to the patient and also outlined possible surgical and non-surgical treatment options of OSA, including the use of a custom-made dental device (which would require a referral to a specialist dentist or oral surgeon), upper airway surgical options, such as pillar implants, radiofrequency surgery, tongue base surgery, inspire device and UPPP (which would involve a referral to an ENT surgeon). Rarely, jaw surgery such as mandibular advancement may be considered.  I also explained the CPAP treatment option to the patient, who indicated that he would be willing to try CPAP if the need arises. I explained the importance of being compliant with PAP treatment, not only for insurance purposes but primarily to improve His symptoms, and for the patient's long term health benefit, including to reduce His cardiovascular risks. I answered all his questions today and the patient was in agreement. I would like to see him back after the sleep study is completed and encouraged him to call with any interim questions, concerns, problems or updates.   Thank you very much for allowing me to participate in the care of this nice  patient. If I can be of any further assistance to you please do not hesitate to call me at [phone].  Sincerely,   Huston FoleySaima Earline Stiner, Eugene Gomez, Eugene Gomez

## 2018-09-01 NOTE — Patient Instructions (Signed)

## 2018-09-02 LAB — BASIC METABOLIC PANEL
BUN/Creatinine Ratio: 9 (ref 9–20)
BUN: 10 mg/dL (ref 6–20)
CO2: 25 mmol/L (ref 20–29)
Calcium: 10.2 mg/dL (ref 8.7–10.2)
Chloride: 102 mmol/L (ref 96–106)
Creatinine, Ser: 1.1 mg/dL (ref 0.76–1.27)
GFR calc Af Amer: 99 mL/min/{1.73_m2} (ref >59)
GFR calc non Af Amer: 86 mL/min/{1.73_m2} (ref >59)
Glucose: 80 mg/dL (ref 65–99)
Potassium: 4.8 mmol/L (ref 3.5–5.2)
Sodium: 142 mmol/L (ref 134–144)

## 2018-09-12 ENCOUNTER — Other Ambulatory Visit: Payer: Self-pay | Admitting: Family Medicine

## 2018-09-12 DIAGNOSIS — R12 Heartburn: Secondary | ICD-10-CM

## 2018-09-12 NOTE — Telephone Encounter (Signed)
Requested Prescriptions  Pending Prescriptions Disp Refills  . pantoprazole (PROTONIX) 40 MG tablet [Pharmacy Med Name: PANTOPRAZOLE SOD DR 40 MG TAB] 90 tablet 0    Sig: TAKE 1 TABLET BY MOUTH EVERY DAY     Gastroenterology: Proton Pump Inhibitors Passed - 09/12/2018  1:04 AM      Passed - Valid encounter within last 12 months    Recent Outpatient Visits          1 month ago Uncontrolled hypertension   [facility] at [place], MD   4 months ago Uncontrolled hypertension   [facility] at [facility], Arlie Solomons, MD   5 months ago Essential hypertension   [facility] Care at [facility], Arlie Solomons, MD   9 months ago Essential hypertension   [facility] at [facility], Arlie Solomons, MD   1 year ago Essential hypertension   [facility] at [place], MD      Future Appointments            In 2 weeks Patwardhan, Reynold Bowen, MD [facility]

## 2018-09-24 ENCOUNTER — Other Ambulatory Visit: Payer: 59

## 2018-09-30 ENCOUNTER — Ambulatory Visit: Payer: 59 | Admitting: Cardiology

## 2018-10-06 ENCOUNTER — Ambulatory Visit (INDEPENDENT_AMBULATORY_CARE_PROVIDER_SITE_OTHER): Payer: 59 | Admitting: Neurology

## 2018-10-06 DIAGNOSIS — R519 Headache, unspecified: Secondary | ICD-10-CM

## 2018-10-06 DIAGNOSIS — G4733 Obstructive sleep apnea (adult) (pediatric): Secondary | ICD-10-CM | POA: Diagnosis not present

## 2018-10-06 DIAGNOSIS — R030 Elevated blood-pressure reading, without diagnosis of hypertension: Secondary | ICD-10-CM

## 2018-10-06 DIAGNOSIS — E669 Obesity, unspecified: Secondary | ICD-10-CM

## 2018-10-06 DIAGNOSIS — I517 Cardiomegaly: Secondary | ICD-10-CM

## 2018-10-06 DIAGNOSIS — I10 Essential (primary) hypertension: Secondary | ICD-10-CM

## 2018-10-06 DIAGNOSIS — R0683 Snoring: Secondary | ICD-10-CM

## 2018-10-07 NOTE — Procedures (Signed)
Patient Information     First Name: Eugene Gomez ID: [id number]  Birth Date: 02-02-1981 Age: 38 Gender: Male  Referring Provider: Dr. Virgina Jock BMI: 32.4 (W=244 lb, H=6' 1'')  Neck Circ.:  17 '' Epworth:  5/24   Sleep Study Information    Study Date: Oct 06, 2018 S/H/A Version: 001.001.001.001 / 4.1.1528 / 25  History:    Eugene Gomez with a history of treatment resistant hypertension, left atrial enlargement, left ventricular hypertrophy, and obesity, who reports snoring and excessive daytime somnolence.  Summary & Diagnosis:      OSA  Recommendations:      This home sleep test demonstrates moderate obstructive sleep apnea with a total AHI of 19.7/hour and O2 nadir of 89%. Given the patient's medical history and sleep related complaints, treatment with positive airway pressure (in the form of CPAP) is recommended. This will require a full night CPAP titration study for proper treatment settings, O2 monitoring and mask fitting. Based on the severity of the sleep disordered breathing an attended titration study is indicated. However, patient's insurance has denied an attended sleep study; therefore, the patient will be advised to proceed with an autoPAP titration/trial at home for now. Please note that untreated obstructive sleep apnea may carry additional perioperative morbidity. Patients with significant obstructive sleep apnea should receive perioperative PAP therapy and the surgeons and particularly the anesthesiologist should be informed of the diagnosis and the severity of the sleep disordered breathing. The patient should be cautioned not to drive, work at heights, or operate dangerous or heavy equipment when tired or sleepy. Review and reiteration of good sleep hygiene measures should be pursued with any patient. Other causes of the patient's symptoms, including circadian rhythm disturbances, an underlying mood disorder, medication effect and/or an underlying medical problem  cannot be ruled out based on this test. Clinical correlation is recommended. The patient and his referring provider will be notified of the test results. The patient will be seen in follow up in sleep clinic at [facility].  I certify that I have reviewed the raw data recording prior to the issuance of this report in accordance with the standards of the American Academy of Sleep Medicine (AASM).  Star Age, MD, PhD [facility]) Diplomat, ABPN (Neurology and Sleep)              Sleep Summary    Oxygen Saturation Statistics     Start Study Time: End Study Time: Total Recording Time:     10:24:31 PM 6:27:55 AM 8 h, 3 min  Total Sleep Time % REM of Sleep Time:  6 h, 6 min  32.6    Mean: 95 Minimum: 89 Maximum: 100  Mean of Desaturations Nadirs (%):   93  Oxygen Desaturation. %: 4-9 10-20 >20 Total  Events Number Total  56 100.0  0 0.0  0 0.0  56 100.0  Oxygen Saturation: <90 <=88 <85 <80 <70  Duration (minutes): Sleep % 0.3 0.0 0.1 0.0 0.0 0.0 0.0 0.0 0.0 0.0     Respiratory Indices      Total Events REM NREM All Night  pRDI:  98 pAHI:  96 ODI:  56  pAHIc:  0  % CSR: 0.0 41.5 41.5 29.3 0.0 12.9 12.3 5.5 0.0 20.1 19.7 11.5 0.0       Pulse Rate Statistics during Sleep (BPM)      Mean: 65 Minimum: 41 Maximum: 96    Indices are calculated using technically  valid sleep time of  4 hrs, 52 min. pRDI/pAHI are calculated using oxi desaturations ? 3%  Body Position Statistics  Position Supine Prone Right Left Non-Supine  Sleep (min) 66.4 3.0 151.5 146.0 300.5  Sleep % 18.1 0.8 41.3 39.8 81.9  pRDI 64.1 N/A 15.0 12.5 14.0  pAHI 62.4 N/A 15.0 12.0 13.8  ODI 45.5 N/A 9.3 4.0 6.8     Snoring Statistics Snoring Level (dB) >40 >50 >60 >70 >80 >Threshold (45)  Sleep (min) 246.4 29.0 7.9 0.0 0.0 53.6  Sleep % 67.2 7.9 2.1 0.0 0.0 14.6    Mean: 43 dB Sleep Stages Chart                              pAHI=19.7                                           Mild              Moderate                    Severe                                                 5              15                    30

## 2018-10-07 NOTE — Progress Notes (Signed)
Patient referred by Dr. Virgina Jock, seen by me on 09/01/18, HST on 10/06/18:     Please call and notify the patient that the recent home sleep test showed obstructive sleep apnea in the moderate range. While I recommend treatment for this in the form CPAP, his insurance will not approve a sleep study for this. They will likely only approve a trial of autoPAP, which means, that we don't have to bring him in for a sleep study with CPAP, but will let him start using a so called autoPAP machine at home, through a DME company (of his choice, or as per insurance requirement). The DME representative will educate him on how to use the machine, how to put the mask on, etc. I have placed an order in the chart. Please send referral, talk to patient, send report to referring MD. We will need a FU in sleep clinic for 10 weeks post-PAP set up, please arrange that with me or one of our NPs. Thanks,   Eugene Age, MD, PhD [facility])

## 2018-10-07 NOTE — Addendum Note (Signed)
Addended by: Star Age on: 10/07/2018 07:08 PM   Modules accepted: Orders

## 2018-10-12 ENCOUNTER — Other Ambulatory Visit: Payer: Self-pay | Admitting: Family Medicine

## 2018-10-12 ENCOUNTER — Telehealth: Payer: Self-pay

## 2018-10-12 DIAGNOSIS — I10 Essential (primary) hypertension: Secondary | ICD-10-CM

## 2018-10-12 DIAGNOSIS — G4733 Obstructive sleep apnea (adult) (pediatric): Secondary | ICD-10-CM | POA: Insufficient documentation

## 2018-10-12 NOTE — Telephone Encounter (Signed)
I called pt. I advised pt that Dr. Rexene Alberts reviewed their sleep study results and found that pt has moderate osa. Dr. Rexene Alberts recommends that pt start an auto pap at home. I reviewed PAP compliance expectations with the pt. Pt is agreeable to starting an auto-PAP. I advised pt that an order will be sent to a DME, [facility], and [facility] will call the pt within about one week after they file with the pt's insurance. [facility] will show the pt how to use the machine, fit for masks, and troubleshoot the auto-PAP if needed. A follow up appt was made for insurance purposes with Amy, NP on 12/01/2018 at 8:30am. Pt verbalized understanding to arrive 15 minutes early and bring their auto-PAP. A letter with all of this information in it will be mailed to the pt as a reminder. I verified with the pt that the address we have on file is correct. Pt verbalized understanding of results. Pt had no questions at this time but was encouraged to call back if questions arise. I have sent the order to [facility] and have received confirmation that they have received the order.

## 2018-10-12 NOTE — Telephone Encounter (Signed)
-----   Message from Star Age, MD sent at 10/07/2018  7:08 PM EDT ----- Patient referred by Dr. Virgina Jock, seen by me on 09/01/18, HST on 10/06/18:     Please call and notify the patient that the recent home sleep test showed obstructive sleep apnea in the moderate range. While I recommend treatment for this in the form CPAP, his insurance will not approve a sleep study for this. They will likely only approve a trial of autoPAP, which means, that we don't have to bring him in for a sleep study with CPAP, but will let him start using a so called autoPAP machine at home, through a DME company (of his choice, or as per insurance requirement). The DME representative will educate him on how to use the machine, how to put the mask on, etc. I have placed an order in the chart. Please send referral, talk to patient, send report to referring MD. We will need a FU in sleep clinic for 10 weeks post-PAP set up, please arrange that with me or one of our NPs. Thanks,   Star Age, MD, PhD [facility])

## 2018-10-12 NOTE — Progress Notes (Deleted)
Follow up visit  Subjective:   Hue Frick, male    DOB: October 31, 1980, 38 y.o.   MRN: [id number]   No chief complaint on file.   *** HPI  38 y.o. *** male with ***  *** Past Medical History:  Diagnosis Date  . Hypertension     *** Past Surgical History:  Procedure Laterality Date  . DENTAL SURGERY Left   . WISDOM TOOTH EXTRACTION      *** Social History   Socioeconomic History  . Marital status: Married    Spouse name: Radiographer, therapeutic  . Number of children: 2  . Years of education: Associates  . Highest education level: Not on file  Occupational History  . 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 Occupation: Night Job    Comment: [employer]  Social Needs  . Financial resource strain: Not on file  . Food insecurity    Worry: Not on file    Inability: Not on file  . Transportation needs    Medical: Not on file    Non-medical: Not on file  Tobacco Use  . Smoking status: Former Smoker    Years: 10.00    Types: Cigars    Quit date: 2018    Years since quitting: 2.6  . Smokeless tobacco: Never Used  . Tobacco comment: smoked black and milds 1 a day   Substance and Sexual Activity  . Alcohol use: Yes    Alcohol/week: 1.0 - 10.0 standard drinks    Types: 1 - 10 Standard drinks or equivalent per week    Comment: MIXED DRINKS  . Drug use: No  . Sexual activity: Yes    Partners: Female  Lifestyle  . Physical activity    Days per week: Not on file    Minutes per session: Not on file  . Stress: Not on file  Relationships  . Social Herbalist on phone: Not on file    Gets together: Not on file    Attends religious service: Not on file    Active member of club or organization: Not on file    Attends meetings of clubs or organizations: Not on file    Relationship status: Not on file  . Intimate partner violence    Fear of current or ex partner: Not on file    Emotionally abused: Not on  file    Physically abused: Not on file    Forced sexual activity: Not on file  Other Topics Concern  . Not on file  Social History Narrative   Lives with his wife and two daughters.    *** Family History  Problem Relation Age of Onset  . Hypertension Mother   . Anemia Mother   . Hypertension Father   . Diabetes Father   . Hypertension Brother   . Drug abuse Brother   . Cancer Sister        brain  . Hypertension Maternal Aunt   . Hypertension Maternal Uncle   . Hypertension Paternal Aunt   . Glaucoma Paternal Aunt   . Diabetes Paternal Aunt   . Hypertension Paternal Uncle   . Diabetes Paternal Uncle     *** Current Outpatient Medications on File Prior to Visit  Medication Sig Dispense Refill  . betamethasone dipropionate 0.05 % lotion APPLY TOPICALLY 2 (TWO) TIMES DAILY. STOP USE X 1 WEEK AFTER EVERY 2 WEEKS OF DAILY USE. 60  mL 0  . carvedilol (COREG) 6.25 MG tablet Take 1 tablet (6.25 mg total) by mouth 2 (two) times daily. 60 tablet 3  . cetirizine (ZYRTEC) 10 MG tablet Take 1 tablet (10 mg total) by mouth daily. 30 tablet 11  . fluticasone (FLONASE) 50 MCG/ACT nasal spray SPRAY 2 SPRAYS INTO EACH NOSTRIL EVERY DAY 16 g 12  . hydrocortisone 2.5 % cream APPLY TO AFFECTED AREA TWICE A DAY AS NEEDED ON FACE  2  . pantoprazole (PROTONIX) 40 MG tablet TAKE 1 TABLET BY MOUTH EVERY DAY 90 tablet 0  . spironolactone (ALDACTONE) 50 MG tablet Take 1 tablet (50 mg total) by mouth daily. 30 tablet 3  . verapamil (CALAN-SR) 240 MG CR tablet TAKE 1 TABLET (240 MG TOTAL) BY MOUTH AT BEDTIME. 30 tablet 2   No current facility-administered medications on file prior to visit.     Cardiovascular studies:  ***  *** Recent labs: ***  *** ROS      *** There were no vitals filed for this visit.  There is no height or weight on file to calculate BMI. There were no vitals filed for this visit.  *** Objective:   Physical Exam        Assessment & Recommendations:   ***   ***   Wendelyn Kiesling Emiliano DyerJ Nathan Stallworth, MD [facility] Pager: [phone] Office: [phone] If no answer Cell [phone]

## 2018-10-13 ENCOUNTER — Other Ambulatory Visit: Payer: Self-pay

## 2018-10-13 ENCOUNTER — Ambulatory Visit (INDEPENDENT_AMBULATORY_CARE_PROVIDER_SITE_OTHER): Payer: 59

## 2018-10-13 DIAGNOSIS — I10 Essential (primary) hypertension: Secondary | ICD-10-CM | POA: Diagnosis not present

## 2018-10-15 ENCOUNTER — Ambulatory Visit: Payer: 59 | Admitting: Cardiology

## 2018-11-03 ENCOUNTER — Telehealth (INDEPENDENT_AMBULATORY_CARE_PROVIDER_SITE_OTHER): Payer: 59 | Admitting: Cardiology

## 2018-11-03 ENCOUNTER — Encounter: Payer: Self-pay | Admitting: Cardiology

## 2018-11-03 VITALS — BP 170/111 | HR 74

## 2018-11-03 DIAGNOSIS — G4733 Obstructive sleep apnea (adult) (pediatric): Secondary | ICD-10-CM

## 2018-11-03 DIAGNOSIS — I10 Essential (primary) hypertension: Secondary | ICD-10-CM

## 2018-11-03 MED ORDER — CARVEDILOL 12.5 MG PO TABS: 12.5000 mg | ORAL_TABLET | Freq: Two times a day (BID) | ORAL | 3 refills | Status: DC

## 2018-11-03 NOTE — Telephone Encounter (Signed)
I called pt. His auto pap follow up appt was moved to 04/06/19 at 1:00pm with MM. 12/01/18 appt was cancelled. Pt verbalized understanding of new appt date and time.

## 2018-11-03 NOTE — Telephone Encounter (Signed)
Received this notice from [employer]: "This patient was scheduled for this morning at 9. He called and cancelled his appointment due to he did not have any money on his FSA account until the first of the year. I tried to let him know that he could still come in and get setup and he could call the billing department and setup a payment arrangement. He still insisted that he would like to wait till the first of the year. I have went ahead and voided this order and put a note in BT."

## 2018-11-03 NOTE — Progress Notes (Deleted)
Subjective:   Eugene Gomez, male    DOB: [birth year], 38 y.o.   MRN: [id number]   I connected with the patient on ***11/03/18 by a video enabled telemedicine application and verified that I am speaking with the correct person using two identifiers.     I discussed the limitations of evaluation and management by telemedicine and the availability of in person appointments. The patient expressed understanding and agreed to proceed.   This visit type was conducted due to national recommendations for restrictions regarding the COVID-19 Pandemic (e.g. social distancing).  This format is felt to be most appropriate for this patient at this time.  All issues noted in this document were discussed and addressed.  No physical exam was performed (except for noted visual exam findings with Tele health visits).  The patient has consented to conduct a Tele health visit and understands insurance will be billed.     Chief complaint:  ***  *** HPI  38 y/o [place] American male with family h/o hypertension, uncontrolled hypertension.  At last visit, I stopped triamterene-HCTZ, started spironolactone 50 mg daily and carvedilol 6.25 mg bid, referred him to sleep study.   Echocardiogram showed mod LVH, normal EF, mild MR. Home sleep study showed mod LVH. BMP was normal.   ***     *** Past Medical History:  Diagnosis Date  . Hypertension     *** Past Surgical History:  Procedure Laterality Date  . DENTAL SURGERY Left   . WISDOM TOOTH EXTRACTION      *** Social History   Socioeconomic History  . Marital status: Married    Spouse name: Therapist, occupational  . Number of children: 2  . Years of education: Associates  . Highest education level: Not on file  Occupational History  . 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 Occupation: Night Job    Comment: Unk  Social Needs  . Financial resource strain: Not on file  . Food  insecurity    Worry: Not on file    Inability: Not on file  . Transportation needs    Medical: Not on file    Non-medical: Not on file  Tobacco Use  . Smoking status: Former Smoker    Years: 10.00    Types: Cigars    Quit date: 2018    Years since quitting: 2.7  . Smokeless tobacco: Never Used  . Tobacco comment: smoked black and milds 1 a day   Substance and Sexual Activity  . Alcohol use: Yes    Alcohol/week: 1.0 - 10.0 standard drinks    Types: 1 - 10 Standard drinks or equivalent per week    Comment: MIXED DRINKS  . Drug use: No  . Sexual activity: Yes    Partners: Female  Lifestyle  . Physical activity    Days per week: Not on file    Minutes per session: Not on file  . Stress: Not on file  Relationships  . Social Musician on phone: Not on file    Gets together: Not on file    Attends religious service: Not on file    Active member of club or organization: Not on file    Attends meetings of clubs or organizations: Not on file    Relationship status: Not on file  . Intimate partner violence    Fear of current or ex partner: Not on file  Emotionally abused: Not on file    Physically abused: Not on file    Forced sexual activity: Not on file  Other Topics Concern  . Not on file  Social History Narrative   Lives with his wife and two daughters.    *** Family History  Problem Relation Age of Onset  . Hypertension Mother   . Anemia Mother   . Hypertension Father   . Diabetes Father   . Hypertension Brother   . Drug abuse Brother   . Cancer Sister        brain  . Hypertension Maternal Aunt   . Hypertension Maternal Uncle   . Hypertension Paternal Aunt   . Glaucoma Paternal Aunt   . Diabetes Paternal Aunt   . Hypertension Paternal Uncle   . Diabetes Paternal Uncle     *** Current Outpatient Medications on File Prior to Visit  Medication Sig Dispense Refill  . betamethasone dipropionate 0.05 % lotion APPLY TOPICALLY 2 (TWO) TIMES DAILY.  STOP USE X 1 WEEK AFTER EVERY 2 WEEKS OF DAILY USE. 60 mL 0  . carvedilol (COREG) 6.25 MG tablet Take 1 tablet (6.25 mg total) by mouth 2 (two) times daily. 60 tablet 3  . cetirizine (ZYRTEC) 10 MG tablet Take 1 tablet (10 mg total) by mouth daily. 30 tablet 11  . fluticasone (FLONASE) 50 MCG/ACT nasal spray SPRAY 2 SPRAYS INTO EACH NOSTRIL EVERY DAY 16 g 12  . hydrocortisone 2.5 % cream APPLY TO AFFECTED AREA TWICE A DAY AS NEEDED ON FACE  2  . pantoprazole (PROTONIX) 40 MG tablet TAKE 1 TABLET BY MOUTH EVERY DAY 90 tablet 0  . spironolactone (ALDACTONE) 50 MG tablet Take 1 tablet (50 mg total) by mouth daily. 30 tablet 3  . verapamil (CALAN-SR) 240 MG CR tablet TAKE 1 TABLET (240 MG TOTAL) BY MOUTH AT BEDTIME. 30 tablet 2   No current facility-administered medications on file prior to visit.     Cardiovascular studies:  ***  Echocardiogram 10/13/2018: Left ventricle cavity is normal in size. Moderate concentric hypertrophy of the left ventricle. Normal LV systolic function with EF 62%. Normal global wall motion. Normal diastolic filling pattern.  Mild (Grade I) mitral regurgitation. Inadequate TR jet to estimate pulmonary artery systolic pressure. Normal right atrial pressure.   Recent labs: Results for KANOA, PHILLIPPI (MRN [id number]) as of 11/03/2018 09:37  Ref. Range 03/23/2018 11:32 09/01/2018 10:03  Sodium Latest Ref Range: 134 - 144 mmol/L 141 142  Potassium Latest Ref Range: 3.5 - 5.2 mmol/L 4.2 4.8  Chloride Latest Ref Range: 96 - 106 mmol/L 101 102  CO2 Latest Ref Range: 20 - 29 mmol/L 25 25  Glucose Latest Ref Range: 65 - 99 mg/dL 83 80  BUN Latest Ref Range: 6 - 20 mg/dL 11 10  Creatinine Latest Ref Range: 0.76 - 1.27 mg/dL 1.13 1.10  Calcium Latest Ref Range: 8.7 - 10.2 mg/dL 10.2 10.2  BUN/Creatinine Ratio Latest Ref Range: 9 - 20  10 9   Alkaline Phosphatase Latest Ref Range: 39 - 117 IU/L 104   Albumin Latest Ref Range: 4.0 - 5.0 g/dL 4.4   Albumin/Globulin Ratio Latest  Ref Range: 1.2 - 2.2  1.5   AST Latest Ref Range: 0 - 40 IU/L 20   ALT Latest Ref Range: 0 - 44 IU/L 33   Total Protein Latest Ref Range: 6.0 - 8.5 g/dL 7.3   Total Bilirubin Latest Ref Range: 0.0 - 1.2 mg/dL 0.2   GFR, Est Non  African American Latest Ref Range: >59 mL/min/1.73 83 86  GFR, Est African American Latest Ref Range: >59 mL/min/1.73 96 99  Total CHOL/HDL Ratio Latest Ref Range: 0.0 - 5.0 ratio 3.5   Cholesterol, Total Latest Ref Range: 100 - 199 mg/dL 676   HDL Cholesterol Latest Ref Range: >39 mg/dL 45   LDL (calc) Latest Ref Range: 0 - 99 mg/dL 81   Triglycerides Latest Ref Range: 0 - 149 mg/dL 195 (H)   VLDL Cholesterol Cal Latest Ref Range: 5 - 40 mg/dL 30     *** ROS      *** There were no vitals filed for this visit. (Measured by the patient using a home BP monitor)  There is no height or weight on file to calculate BMI. There were no vitals filed for this visit.  *** Observation/findings during video visit   Objective:    Physical Exam        Assessment & Recommendations:   ***  ***   Karmela Bram Emiliano Dyer, MD [facility] Pager: [phone] Office: [phone] If no answer Cell [phone]

## 2018-11-04 NOTE — Progress Notes (Signed)
Patient referred by Eugene Bosworth, MD for treatment resistant HTN   Subjective:   Eugene Gomez, male    DOB: 01/07/1981, 38 y.o.   MRN: [id number]   Chief Complaint  Patient presents with  . Hypertension    38 y/o [place] American male with family h/o hypertension, uncontrolled hypertension.  At last visit, I stopped triamterene-HCTZ, started spironolactone 50 mg daily and carvedilol 6.25 mg bid, referred him to sleep study.   Echocardiogram showed mod LVH, normal EF, mild MR. Home sleep study showed mod LVH. BMP was normal.  Patient has been compliant with his medical therapy. However, he has noticed bilateral breast tenderness since being on spironolactone. Blood pressure still remains elevated at 160/88 mmHg.  Leg swelling has significantly improved.  He has made changes to his diet and lifestyle, reducing salt intake, regular walking.  Patient was recommended CPAP after sleep study. However, he is unable to buy 1 due to financial reasons. He will be able to buy it at the beginning of new year, with his new flex spending capacity.  Past Medical History:  Diagnosis Date  . Hypertension    Past Surgical History:  Procedure Laterality Date  . DENTAL SURGERY Left   . WISDOM TOOTH EXTRACTION     Social History   Socioeconomic History  . Marital status: Married    Spouse name: Eugene Gomez  . Number of children: 2  . Years of education: Associates  . Highest education level: Not on file  Gomez History  . 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 Occupation: Night Job    Comment: Unk  Social Needs  . Financial resource strain: Not on file  . Food insecurity    Worry: Not on file    Inability: Not on file  . Transportation needs    Medical: Not on file    Non-medical: Not on file  Tobacco Use  . Smoking status: Former Smoker    Packs/day: 2.00    Years: 10.00    Pack years:  20.00    Types: Cigars    Quit date: 2018    Years since quitting: 2.7  . Smokeless tobacco: Never Used  . Tobacco comment: smoked black and milds 1 a day   Substance and Sexual Activity  . Alcohol use: Yes    Alcohol/week: 1.0 - 10.0 standard drinks    Types: 1 - 10 Standard drinks or equivalent per week    Comment: MIXED DRINKS  . Drug use: No  . Sexual activity: Yes    Partners: Female  Lifestyle  . Physical activity    Days per week: Not on file    Minutes per session: Not on file  . Stress: Not on file  Relationships  . Social Musician on phone: Not on file    Gets together: Not on file    Attends religious service: Not on file    Active member of club or organization: Not on file    Attends meetings of clubs or organizations: Not on file    Relationship status: Not on file  . Intimate partner violence    Fear of current or ex partner: Not on file    Emotionally abused: Not on file    Physically abused: Not on file    Forced sexual activity: Not on file  Other Topics Concern  . Not on file  Social  History Narrative   Lives with his wife and two daughters.    Quit smoking 2 years ago only smoked occasionally before that Will have a cocktail once a week socially   Family History  Problem Relation Age of Onset  . Hypertension Mother   . Anemia Mother   . Hypertension Father   . Diabetes Father   . Hypertension Brother   . Drug abuse Brother   . Cancer Sister        brain  . Hypertension Maternal Aunt   . Hypertension Maternal Uncle   . Hypertension Paternal Aunt   . Glaucoma Paternal Aunt   . Diabetes Paternal Aunt   . Hypertension Paternal Uncle   . Diabetes Paternal Uncle      Current Outpatient Medications on File Prior to Visit  Medication Sig Dispense Refill  . betamethasone dipropionate 0.05 % lotion APPLY TOPICALLY 2 (TWO) TIMES DAILY. STOP USE X 1 WEEK AFTER EVERY 2 WEEKS OF DAILY USE. 60 mL 0  . cetirizine (ZYRTEC) 10 MG tablet  Take 1 tablet (10 mg total) by mouth daily. 30 tablet 11  . fluticasone (FLONASE) 50 MCG/ACT nasal spray SPRAY 2 SPRAYS INTO EACH NOSTRIL EVERY DAY 16 g 12  . hydrocortisone 2.5 % cream APPLY TO AFFECTED AREA TWICE A DAY AS NEEDED ON FACE  2  . pantoprazole (PROTONIX) 40 MG tablet TAKE 1 TABLET BY MOUTH EVERY DAY 90 tablet 0  . verapamil (CALAN-SR) 240 MG CR tablet TAKE 1 TABLET (240 MG TOTAL) BY MOUTH AT BEDTIME. 30 tablet 2   No current facility-administered medications on file prior to visit.     Cardiovascular studies:  Echocardiogram 10/13/2018: Left ventricle cavity is normal in size. Moderate concentric hypertrophy of the left ventricle. Normal LV systolic function with EF 62%. Normal global wall motion. Normal diastolic filling pattern.  Mild (Grade I) mitral regurgitation. Inadequate TR jet to estimate pulmonary artery systolic pressure. Normal right atrial pressure.  Recent labs: Results for Eugene Gomez (MRN [id number]) as of 11/03/2018 09:37  Ref. Range 03/23/2018 11:32 09/01/2018 10:03  Sodium Latest Ref Range: 134 - 144 mmol/L 141 142  Potassium Latest Ref Range: 3.5 - 5.2 mmol/L 4.2 4.8  Chloride Latest Ref Range: 96 - 106 mmol/L 101 102  CO2 Latest Ref Range: 20 - 29 mmol/L 25 25  Glucose Latest Ref Range: 65 - 99 mg/dL 83 80  BUN Latest Ref Range: 6 - 20 mg/dL 11 10  Creatinine Latest Ref Range: 0.76 - 1.27 mg/dL 1.13 1.10  Calcium Latest Ref Range: 8.7 - 10.2 mg/dL 10.2 10.2  BUN/Creatinine Ratio Latest Ref Range: 9 - 20 10 9   Alkaline Phosphatase Latest Ref Range: 39 - 117 IU/L 104   Albumin Latest Ref Range: 4.0 - 5.0 g/dL 4.4   Albumin/Globulin Ratio Latest Ref Range: 1.2 - 2.2 1.5   AST Latest Ref Range: 0 - 40 IU/L 20   ALT Latest Ref Range: 0 - 44 IU/L 33   Total Protein Latest Ref Range: 6.0 - 8.5 g/dL 7.3   Total Bilirubin Latest Ref Range: 0.0 - 1.2 mg/dL 0.2   GFR, Est Non African American Latest Ref Range: >59 mL/min/1.73 83 86  GFR, Est African  American Latest Ref Range: >59 mL/min/1.73 96 99  Total CHOL/HDL Ratio Latest Ref Range: 0.0 - 5.0 ratio 3.5   Cholesterol, Total Latest Ref Range: 100 - 199 mg/dL 156   HDL Cholesterol Latest Ref Range: >39 mg/dL 45   LDL (calc) Latest  Ref Range: 0 - 99 mg/dL 81   Triglycerides Latest Ref Range: 0 - 149 mg/dL 782152 (H)   VLDL Cholesterol Cal Latest Ref Range: 5 - 40 mg/dL 30      Recent labs: Lab Results  Component Value Date   WBC 6.2 07/29/2017   HGB 14.9 07/29/2017   HCT 45.1 07/29/2017   MCV 76 (L) 07/29/2017   PLT 334 07/29/2017   CMP     Component Value Date/Time   NA 142 09/01/2018 1003   K 4.8 09/01/2018 1003   CL 102 09/01/2018 1003   CO2 25 09/01/2018 1003   GLUCOSE 80 09/01/2018 1003   GLUCOSE 77 08/29/2015 1248   BUN 10 09/01/2018 1003   CREATININE 1.10 09/01/2018 1003   CREATININE 1.00 08/29/2015 1248   CALCIUM 10.2 09/01/2018 1003   PROT 7.3 03/23/2018 1132   ALBUMIN 4.4 03/23/2018 1132   AST 20 03/23/2018 1132   ALT 33 03/23/2018 1132   ALKPHOS 104 03/23/2018 1132   BILITOT 0.2 03/23/2018 1132   GFRNONAA 86 09/01/2018 1003   GFRAA 99 09/01/2018 1003     Review of Systems  Constitution: Negative for decreased appetite and fever.  HENT: Negative for sore throat and stridor.   Eyes: Negative for blurred vision and pain.  Cardiovascular: Negative for chest pain and dyspnea on exertion.       Breast tenderness  Respiratory: Negative for shortness of breath and wheezing.   Endocrine: Negative for polydipsia.  Hematologic/Lymphatic: Negative for bleeding problem.  Skin: Negative for dry skin and flushing.  Musculoskeletal: Negative for muscle cramps and muscle weakness.  Gastrointestinal: Negative for diarrhea and vomiting.  Genitourinary: Negative for frequency.  Neurological: Negative for dizziness and weakness.  Psychiatric/Behavioral: Negative for altered mental status. The patient is not nervous/anxious.         Vitals:   10/25/18 1517  11/03/18 1520  BP: (!) 160/100 (!) 170/111  Pulse:  74      Objective:   Physical Exam  Constitutional: He is oriented to person, place, and time. He appears well-developed and well-nourished. No distress.  Pulmonary/Chest: Effort normal.  Neurological: He is alert and oriented to person, place, and time.  Psychiatric: He has a normal mood and affect.  Nursing note and vitals reviewed.      Assessment & Recommendations:   38 year old African-American male with uncontrolled hypertension, moderate OSA, family history of hypertension.  Hypertension: Remains uncontrolled.  He has developed bilateral breast tenderness likely due to spironolactone.  Stop spironolactone.  Increase carvedilol to 12.5 mg twice daily.  Continue verapamil 240 mg daily.  Encourage CPAP usage for moderate sleep apnea.  He will likely need addition of diuretic now that we have had to stop spironolactone.  Will readdress this at upcoming follow-up visit in 4 weeks.  Anticipate that he may need work-up for secondary hypertension in near future, if blood pressure stays uncontrolled, in spite of correction of OSA and on 3 antihypertensive agents.   Elder NegusManish J Kye Hedden, MD [facility] Pager: [phone] Office: [phone] If no answer Cell [phone]

## 2018-11-22 ENCOUNTER — Other Ambulatory Visit: Payer: Self-pay | Admitting: Family Medicine

## 2018-11-22 DIAGNOSIS — R12 Heartburn: Secondary | ICD-10-CM

## 2018-11-25 ENCOUNTER — Other Ambulatory Visit: Payer: Self-pay | Admitting: Cardiology

## 2018-11-25 DIAGNOSIS — I10 Essential (primary) hypertension: Secondary | ICD-10-CM

## 2018-12-01 ENCOUNTER — Ambulatory Visit: Payer: Self-pay | Admitting: Family Medicine

## 2018-12-08 ENCOUNTER — Other Ambulatory Visit: Payer: Self-pay

## 2018-12-08 ENCOUNTER — Ambulatory Visit: Payer: 59 | Admitting: Cardiology

## 2018-12-08 ENCOUNTER — Encounter: Payer: Self-pay | Admitting: Cardiology

## 2018-12-08 VITALS — BP 165/110 | HR 59 | Temp 96.9°F | Ht 72.0 in | Wt 231.0 lb

## 2018-12-08 DIAGNOSIS — I10 Essential (primary) hypertension: Secondary | ICD-10-CM

## 2018-12-08 MED ORDER — BIDIL 20-37.5 MG PO TABS: 1.0000 | ORAL_TABLET | Freq: Three times a day (TID) | ORAL | 3 refills | Status: DC

## 2018-12-08 NOTE — Progress Notes (Signed)
Patient referred by Eugene Moron, MD for treatment resistant HTN   Subjective:   Eugene Gomez, male    DOB: 1981-02-05, 38 y.o.   MRN: [id number]   Chief Complaint  Patient presents with  . Hypertension  . Follow-up    4 week    38 y/o Eugene Gomez male with family h/o hypertension, uncontrolled hypertension.  He has not tolerate amlodipine due to leg edema, and lisinopril due to cough, spironolactone due to breast tenderness.   He is currently on carvedilol 12.5 mg bid, verapamil 240 mg daily( Started by PCP in march 2020). He was diagnosed with moderate OSA and recommended CPAP.  His blood pressure continues to be elevated.  He denies chest pain, shortness of breath, palpitations, leg edema, orthopnea, PND, TIA/syncope. He has intentionally lost weight with diet and lifestyle changes.     Past Medical History:  Diagnosis Date  . Hypertension    Past Surgical History:  Procedure Laterality Date  . DENTAL SURGERY Left   . WISDOM TOOTH EXTRACTION     Social History   Socioeconomic History  . Marital status: Married    Spouse name: Radiographer, therapeutic  . Number of children: 2  . Years of education: Associates  . Highest education level: Not on file  Occupational History  . 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 Occupation: Night Job    Comment: [employer]  Social Needs  . Financial resource strain: Not on file  . Food insecurity    Worry: Not on file    Inability: Not on file  . Transportation needs    Medical: Not on file    Non-medical: Not on file  Tobacco Use  . Smoking status: Former Smoker    Packs/day: 2.00    Years: 10.00    Pack years: 20.00    Types: Cigars    Quit date: 2018    Years since quitting: 2.8  . Smokeless tobacco: Never Used  . Tobacco comment: smoked black and milds 1 a day   Substance and Sexual Activity  . Alcohol use: Yes    Alcohol/week: 1.0 - 10.0 standard  drinks    Types: 1 - 10 Standard drinks or equivalent per week    Comment: MIXED DRINKS  . Drug use: No  . Sexual activity: Yes    Partners: Female  Lifestyle  . Physical activity    Days per week: Not on file    Minutes per session: Not on file  . Stress: Not on file  Relationships  . Social Herbalist on phone: Not on file    Gets together: Not on file    Attends religious service: Not on file    Active member of club or organization: Not on file    Attends meetings of clubs or organizations: Not on file    Relationship status: Not on file  . Intimate partner violence    Fear of current or ex partner: Not on file    Emotionally abused: Not on file    Physically abused: Not on file    Forced sexual activity: Not on file  Other Topics Concern  . Not on file  Social History Narrative   Lives with his wife and two daughters.    Quit smoking 2 years ago only smoked occasionally before that Will have a cocktail once a week socially   Family History  Problem Relation Age of Onset  . Hypertension Mother   . Anemia Mother   . Hypertension Father   . Diabetes Father   . Hypertension Brother   . Drug abuse Brother   . Cancer Sister        brain  . Hypertension Maternal Aunt   . Hypertension Maternal Uncle   . Hypertension Paternal Aunt   . Glaucoma Paternal Aunt   . Diabetes Paternal Aunt   . Hypertension Paternal Uncle   . Diabetes Paternal Uncle      Current Outpatient Medications on File Prior to Visit  Medication Sig Dispense Refill  . betamethasone dipropionate 0.05 % lotion APPLY TOPICALLY 2 (TWO) TIMES DAILY. STOP USE X 1 WEEK AFTER EVERY 2 WEEKS OF DAILY USE. 60 mL 0  . carvedilol (COREG) 12.5 MG tablet Take 1 tablet (12.5 mg total) by mouth 2 (two) times daily with a meal. 60 tablet 3  . cetirizine (ZYRTEC) 10 MG tablet Take 1 tablet (10 mg total) by mouth daily. 30 tablet 11  . fluticasone (FLONASE) 50 MCG/ACT nasal spray SPRAY 2 SPRAYS INTO EACH  NOSTRIL EVERY DAY 16 g 12  . hydrocortisone 2.5 % cream APPLY TO AFFECTED AREA TWICE A DAY AS NEEDED ON FACE  2  . pantoprazole (PROTONIX) 40 MG tablet TAKE 1 TABLET BY MOUTH EVERY DAY 30 tablet 2  . verapamil (CALAN-SR) 240 MG CR tablet TAKE 1 TABLET (240 MG TOTAL) BY MOUTH AT BEDTIME. 30 tablet 2   No current facility-administered medications on file prior to visit.     Cardiovascular studies:  Echocardiogram 10/13/2018: Left ventricle cavity is normal in size. Moderate concentric hypertrophy of the left ventricle. Normal LV systolic function with EF 62%. Normal global wall motion. Normal diastolic filling pattern.  Mild (Grade I) mitral regurgitation. Inadequate TR jet to estimate pulmonary artery systolic pressure. Normal right atrial pressure.  Recent labs: Results for Eugene, Gomez (MRN [id number]) as of 12/08/2018 11:51  Ref. Range 09/01/2018 10:03  Sodium Latest Ref Range: 134 - 144 mmol/L 142  Potassium Latest Ref Range: 3.5 - 5.2 mmol/L 4.8  Chloride Latest Ref Range: 96 - 106 mmol/L 102  CO2 Latest Ref Range: 20 - 29 mmol/L 25  Glucose Latest Ref Range: 65 - 99 mg/dL 80  BUN Latest Ref Range: 6 - 20 mg/dL 10  Creatinine Latest Ref Range: 0.76 - 1.27 mg/dL 3.26  Calcium Latest Ref Range: 8.7 - 10.2 mg/dL 71.2  BUN/Creatinine Ratio Latest Ref Range: 9 - 20  9   Results for Eugene, Gomez (MRN [id number]) as of 12/08/2018 11:51  Ref. Range 03/23/2018 11:32  Total CHOL/HDL Ratio Latest Ref Range: 0.0 - 5.0 ratio 3.5  Cholesterol, Total Latest Ref Range: 100 - 199 mg/dL 825  HDL Cholesterol Latest Ref Range: >39 mg/dL 45  LDL (calc) Latest Ref Range: 0 - 99 mg/dL 81  Triglycerides Latest Ref Range: 0 - 149 mg/dL 053 (H)  VLDL Cholesterol Cal Latest Ref Range: 5 - 40 mg/dL 30   Results for Eugene, Gomez (MRN [id number]) as of 12/08/2018 11:51  Ref. Range 07/29/2017 10:24  WBC Latest Ref Range: 3.4 - 10.8 x10E3/uL 6.2  RBC Latest Ref Range: 4.14 - 5.80 x10E6/uL 5.90 (H)   Hemoglobin Latest Ref Range: 13.0 - 17.7 g/dL 79.0  HCT Latest Ref Range: 37.5 - 51.0 % 45.1  MCV Latest Ref Range: 79 - 97 fL 76 (L)  MCH Latest Ref Range: 26.6 - 33.0 pg 25.3 (L)  MCHC Latest  Ref Range: 31.5 - 35.7 g/dL 16.133.0  RDW Latest Ref Range: 12.3 - 15.4 % 14.9  Platelets Latest Ref Range: 150 - 450 x10E3/uL 334    Review of Systems  Constitution: Negative for decreased appetite and fever.  HENT: Negative for sore throat and stridor.   Eyes: Negative for blurred vision and pain.  Cardiovascular: Negative for chest pain and dyspnea on exertion.       Breast tenderness  Respiratory: Negative for shortness of breath and wheezing.   Endocrine: Negative for polydipsia.  Hematologic/Lymphatic: Negative for bleeding problem.  Skin: Negative for dry skin and flushing.  Musculoskeletal: Negative for muscle cramps and muscle weakness.  Gastrointestinal: Negative for diarrhea and vomiting.  Genitourinary: Negative for frequency.  Neurological: Negative for dizziness and weakness.  Psychiatric/Behavioral: Negative for altered mental status. The patient is not nervous/anxious.         Vitals:   12/08/18 1046  BP: (!) 175/115  Pulse: 64  Temp: (!) 96.9 F (36.1 C)  SpO2: 98%      Objective:   Physical Exam  Constitutional: He is oriented to person, place, and time. He appears well-developed and well-nourished. No distress.  Pulmonary/Chest: Effort normal.  Neurological: He is alert and oriented to person, place, and time.  Psychiatric: He has a normal mood and affect.  Nursing note and vitals reviewed.      Assessment & Recommendations:   38 year old African-Gomez male with resistant hypertension, moderate OSA, family history of hypertension.  Resistant hypertension: He has not tolerate amlodipine due to leg edema, and lisinopril due to cough, spironolactone due to breast tenderness.   He is currently on carvedilol 12.5 mg bid, verapamil 240 mg daily( Started by  PCP in march 2020). He was diagnosed with moderate OSA and recommended CPAP.  Added Bidil 20-37.5 mg tid. Will check renal artery duplex. Low suspicion for hyperaldosteronism.  F/u in 4 weeks.  Elder NegusManish J Humphrey Guerreiro, MD [facility] Pager: [phone] Office: [phone] If no answer Cell [phone]

## 2018-12-16 ENCOUNTER — Ambulatory Visit (INDEPENDENT_AMBULATORY_CARE_PROVIDER_SITE_OTHER): Payer: 59

## 2018-12-16 ENCOUNTER — Other Ambulatory Visit: Payer: Self-pay

## 2018-12-16 DIAGNOSIS — I10 Essential (primary) hypertension: Secondary | ICD-10-CM

## 2018-12-22 ENCOUNTER — Other Ambulatory Visit (HOSPITAL_COMMUNITY): Payer: Self-pay | Admitting: Cardiology

## 2018-12-23 LAB — BASIC METABOLIC PANEL
BUN/Creatinine Ratio: 10 (ref 9–20)
BUN: 11 mg/dL (ref 6–20)
CO2: 22 mmol/L (ref 20–29)
Calcium: 9.8 mg/dL (ref 8.7–10.2)
Chloride: 104 mmol/L (ref 96–106)
Creatinine, Ser: 1.13 mg/dL (ref 0.76–1.27)
GFR calc Af Amer: 95 mL/min/{1.73_m2} (ref >59)
GFR calc non Af Amer: 83 mL/min/{1.73_m2} (ref >59)
Glucose: 89 mg/dL (ref 65–99)
Potassium: 4.1 mmol/L (ref 3.5–5.2)
Sodium: 139 mmol/L (ref 134–144)

## 2019-01-05 ENCOUNTER — Other Ambulatory Visit: Payer: Self-pay

## 2019-01-05 ENCOUNTER — Telehealth (INDEPENDENT_AMBULATORY_CARE_PROVIDER_SITE_OTHER): Payer: 59 | Admitting: Cardiology

## 2019-01-05 ENCOUNTER — Encounter: Payer: Self-pay | Admitting: Cardiology

## 2019-01-05 VITALS — BP 159/110 | HR 57

## 2019-01-05 DIAGNOSIS — I10 Essential (primary) hypertension: Secondary | ICD-10-CM | POA: Diagnosis not present

## 2019-01-05 DIAGNOSIS — G4733 Obstructive sleep apnea (adult) (pediatric): Secondary | ICD-10-CM

## 2019-01-05 MED ORDER — AMLODIPINE BESYLATE 5 MG PO TABS: 5.0000 mg | ORAL_TABLET | Freq: Every day | ORAL | 3 refills | Status: DC

## 2019-01-05 NOTE — Progress Notes (Signed)
Patient referred by Eugene Gomez, [name], MD for treatment resistant HTN   Subjective:   Eugene Gomez, male    DOB: Apr 08, 1980, 38 y.o.   MRN: [id number]  I connected with the patient on 01/05/2019 by a video enabled telemedicine application and verified that I am speaking with the correct person using two identifiers.     I discussed the limitations of evaluation and management by telemedicine and the availability of in person appointments. The patient expressed understanding and agreed to proceed.   This visit type was conducted due to national recommendations for restrictions regarding the COVID-19 Pandemic (e.g. social distancing).  This format is felt to be most appropriate for this patient at this time.  All issues noted in this document were discussed and addressed.  No physical exam was performed (except for noted visual exam findings with Tele health visits).  The patient has consented to conduct a Tele health visit and understands insurance will be billed.   Chief Complaint  Patient presents with  . Hypertension    38 y/o [place] male with resistant hypertension.  He has not tolerated amlodipine 10 mg due to leg edema, and lisinopril due to cough, spironolactone due to breast tenderness.   He is currently on carvedilol 12.5 mg bid, verapamil 240 mg daily( Started by PCP in march 2020), Bidil 20-37.5 mg 1 tab tid. He is tolerating these medications fairly well with some side effects of headache-likely from Bidil. He also has awakening middle of night. He was diagnosed with moderate OSA and recommended CPAP. He will be starting this at the beginning of 2021.   His blood pressure is improved, but remains suboptimal. He denies chest pain, shortness of breath, palpitations, leg edema, orthopnea, PND, TIA/syncope. He has intentionally lost weight with diet and lifestyle changes.     Past Medical History:  Diagnosis Date  . Hypertension    Past Surgical History:  Procedure  Laterality Date  . DENTAL SURGERY Left   . WISDOM TOOTH EXTRACTION     Social History   Socioeconomic History  . Marital status: Married    Spouse name: Therapist, occupationalChassity Gomez  . Number of children: 2  . Years of education: Associates  . Highest education level: Not on file  Occupational History  . Occupation: Heavy Arts development officerquipment Operator    Employer: [employer]     Comment: (trash truck driver)  . Occupation: Night Job    Comment: [employer]  Social Needs  . Financial resource strain: Not on file  . Food insecurity    Worry: Not on file    Inability: Not on file  . Transportation needs    Medical: Not on file    Non-medical: Not on file  Tobacco Use  . Smoking status: Former Smoker    Packs/day: 2.00    Years: 10.00    Pack years: 20.00    Types: Cigars    Quit date: 2018    Years since quitting: 2.9  . Smokeless tobacco: Never Used  . Tobacco comment: smoked black and milds 1 a day   Substance and Sexual Activity  . Alcohol use: Yes    Alcohol/week: 1.0 - 10.0 standard drinks    Types: 1 - 10 Standard drinks or equivalent per week    Comment: MIXED DRINKS  . Drug use: No  . Sexual activity: Yes    Partners: Female  Lifestyle  . Physical activity    Days per week: Not on file  Minutes per session: Not on file  . Stress: Not on file  Relationships  . Social Herbalist on phone: Not on file    Gets together: Not on file    Attends religious service: Not on file    Active member of club or organization: Not on file    Attends meetings of clubs or organizations: Not on file    Relationship status: Not on file  . Intimate partner violence    Fear of current or ex partner: Not on file    Emotionally abused: Not on file    Physically abused: Not on file    Forced sexual activity: Not on file  Other Topics Concern  . Not on file  Social History Narrative   Lives with his wife and two daughters.    Quit smoking 2 years ago only smoked occasionally before  that Will have a cocktail once a week socially   Family History  Problem Relation Age of Onset  . Hypertension Mother   . Anemia Mother   . Hypertension Father   . Diabetes Father   . Hypertension Brother   . Drug abuse Brother   . Cancer Sister        brain  . Hypertension Maternal Aunt   . Hypertension Maternal Uncle   . Hypertension Paternal Aunt   . Glaucoma Paternal Aunt   . Diabetes Paternal Aunt   . Hypertension Paternal Uncle   . Diabetes Paternal Uncle      Current Outpatient Medications on File Prior to Visit  Medication Sig Dispense Refill  . betamethasone dipropionate 0.05 % lotion APPLY TOPICALLY 2 (TWO) TIMES DAILY. STOP USE X 1 WEEK AFTER EVERY 2 WEEKS OF DAILY USE. 60 mL 0  . carvedilol (COREG) 12.5 MG tablet Take 1 tablet (12.5 mg total) by mouth 2 (two) times daily with a meal. 60 tablet 3  . cetirizine (ZYRTEC) 10 MG tablet Take 1 tablet (10 mg total) by mouth daily. 30 tablet 11  . fluticasone (FLONASE) 50 MCG/ACT nasal spray SPRAY 2 SPRAYS INTO EACH NOSTRIL EVERY DAY 16 g 12  . hydrocortisone 2.5 % cream APPLY TO AFFECTED AREA TWICE A DAY AS NEEDED ON FACE  2  . isosorbide-hydrALAZINE (BIDIL) 20-37.5 MG tablet Take 1 tablet by mouth 3 (three) times daily. 90 tablet 3  . pantoprazole (PROTONIX) 40 MG tablet TAKE 1 TABLET BY MOUTH EVERY DAY 30 tablet 2  . verapamil (CALAN-SR) 240 MG CR tablet TAKE 1 TABLET (240 MG TOTAL) BY MOUTH AT BEDTIME. 30 tablet 2   No current facility-administered medications on file prior to visit.     Cardiovascular studies:  Renal artery duplex  12/16/2018: No evidence of renal artery occlusive disease in either renal artery. Normal intrarenal vascular perfusion is noted in both kidneys. Renal length is within normal limits for both kidneys. Simple cyst measuring 3x3cm noted.  Normal abdominal aorta flow velocities noted.  Echocardiogram 10/13/2018: Left ventricle cavity is normal in size. Moderate concentric hypertrophy  of the left ventricle. Normal LV systolic function with EF 62%. Normal global wall motion. Normal diastolic filling pattern.  Mild (Grade I) mitral regurgitation. Inadequate TR jet to estimate pulmonary artery systolic pressure. Normal right atrial pressure.  Recent labs: Results for FAYSAL, FENOGLIO (MRN [id number]) as of 12/08/2018 11:51  Ref. Range 09/01/2018 10:03  Sodium Latest Ref Range: 134 - 144 mmol/L 142  Potassium Latest Ref Range: 3.5 - 5.2 mmol/L 4.8  Chloride Latest Ref Range:  96 - 106 mmol/L 102  CO2 Latest Ref Range: 20 - 29 mmol/L 25  Glucose Latest Ref Range: 65 - 99 mg/dL 80  BUN Latest Ref Range: 6 - 20 mg/dL 10  Creatinine Latest Ref Range: 0.76 - 1.27 mg/dL 1.01  Calcium Latest Ref Range: 8.7 - 10.2 mg/dL 75.1  BUN/Creatinine Ratio Latest Ref Range: 9 - 20  9   Results for NYCERE, PRESLEY (MRN [id number]) as of 12/08/2018 11:51  Ref. Range 03/23/2018 11:32  Total CHOL/HDL Ratio Latest Ref Range: 0.0 - 5.0 ratio 3.5  Cholesterol, Total Latest Ref Range: 100 - 199 mg/dL 242  HDL Cholesterol Latest Ref Range: >39 mg/dL 45  LDL (calc) Latest Ref Range: 0 - 99 mg/dL 81  Triglycerides Latest Ref Range: 0 - 149 mg/dL 353 (H)  VLDL Cholesterol Cal Latest Ref Range: 5 - 40 mg/dL 30   Results for GENEVIEVE, RITZEL (MRN [id number]) as of 12/08/2018 11:51  Ref. Range 07/29/2017 10:24  WBC Latest Ref Range: 3.4 - 10.8 x10E3/uL 6.2  RBC Latest Ref Range: 4.14 - 5.80 x10E6/uL 5.90 (H)  Hemoglobin Latest Ref Range: 13.0 - 17.7 g/dL 08.6  HCT Latest Ref Range: 37.5 - 51.0 % 45.1  MCV Latest Ref Range: 79 - 97 fL 76 (L)  MCH Latest Ref Range: 26.6 - 33.0 pg 25.3 (L)  MCHC Latest Ref Range: 31.5 - 35.7 g/dL 76.1  RDW Latest Ref Range: 12.3 - 15.4 % 14.9  Platelets Latest Ref Range: 150 - 450 x10E3/uL 334    Review of Systems  Constitution: Negative for decreased appetite and fever.  HENT: Negative for sore throat and stridor.   Eyes: Negative for blurred vision and pain.   Cardiovascular: Negative for chest pain and dyspnea on exertion.       Breast tenderness  Respiratory: Negative for shortness of breath and wheezing.   Endocrine: Negative for polydipsia.  Hematologic/Lymphatic: Negative for bleeding problem.  Skin: Negative for dry skin and flushing.  Musculoskeletal: Negative for muscle cramps and muscle weakness.  Gastrointestinal: Negative for diarrhea and vomiting.  Genitourinary: Negative for frequency.  Neurological: Negative for dizziness and weakness.  Psychiatric/Behavioral: Negative for altered mental status. The patient is not nervous/anxious.         Vitals:   01/05/19 1318 01/05/19 1321  BP: (!) 174/115 (!) 159/110  Pulse: 61 (!) 57     Objective:   Physical Exam  Constitutional: He is oriented to person, place, and time. He appears well-developed and well-nourished. No distress.  Pulmonary/Chest: Effort normal.  Neurological: He is alert and oriented to person, place, and time.  Psychiatric: He has a normal mood and affect.  Nursing note and vitals reviewed.      Assessment & Recommendations:   38 year old African-American male with resistant hypertension, moderate OSA, family history of hypertension.  Resistant hypertension: Likely familial. No RAS on duplex. Will check renin/aldosterone.  He has not tolerated amlodipine 10 mg due to leg edema, and lisinopril due to cough, spironolactone due to breast tenderness.   Continue carvedilol 12.5 mg bid, verapamil 240 mg daily( Started by PCP in march 2020), Bidil 20-37.5 mg tid. Resume amlodipine at 5 mg daily.  Recommend using CPAP for OSA.  F/u in 6 weeks.  Elder Negus, MD [facility] Pager: [phone] Office: [phone] If no answer Cell [phone]

## 2019-02-16 ENCOUNTER — Telehealth: Payer: 59 | Admitting: Cardiology

## 2019-02-16 ENCOUNTER — Other Ambulatory Visit: Payer: Self-pay

## 2019-02-16 VITALS — BP 145/95

## 2019-02-16 DIAGNOSIS — G4733 Obstructive sleep apnea (adult) (pediatric): Secondary | ICD-10-CM | POA: Diagnosis not present

## 2019-02-16 DIAGNOSIS — I10 Essential (primary) hypertension: Secondary | ICD-10-CM

## 2019-02-16 NOTE — Progress Notes (Signed)
Patient referred by Forrest Moron, MD for treatment resistant HTN   Subjective:   Eugene Gomez, male    DOB: 1980/12/21, 39 y.o.   MRN: [id number]  I connected with the patient on 02/16/2019 by a telephone call and verified that I am speaking with the correct person using two identifiers.     I offered the patient a video enabled application for a virtual visit. Unfortunately, this could not be accomplished due to technical difficulties/lack of video enabled phone/computer. I discussed the limitations of evaluation and management by telemedicine and the availability of in person appointments. The patient expressed understanding and agreed to proceed.   This visit type was conducted due to national recommendations for restrictions regarding the COVID-19 Pandemic (e.g. social distancing).  This format is felt to be most appropriate for this patient at this time.  All issues noted in this document were discussed and addressed.  No physical exam was performed (except for noted visual exam findings with Tele health visits).  The patient has consented to conduct a Tele health visit and understands insurance will be billed.    Chief Complaint  Patient presents with  . Hypertension    39 y/o [place] American male with resistant hypertension.  He has not tolerated amlodipine 10 mg due to leg edema, and lisinopril due to cough, spironolactone due to breast tenderness.   He is currently on carvedilol 12.5 mg bid, verapamil 240 mg daily( Started by PCP in march 2020), Bidil 20-37.5 mg 1 tab tid, and amlodipine at a lower dose of 5 mg. He was diagnosed with moderate OSA and recommended CPA, and is awaiting initiation of CPAP.  His blood pressure remains suboptimal, but has improved compared to his initial visits. Leg edema occurred after starting amlodipine, but has now improved. He "feels sick" after taking Bidil, but has also diminished now.     Current Outpatient Medications on File Prior to  Visit  Medication Sig Dispense Refill  . amLODipine (NORVASC) 5 MG tablet Take 1 tablet (5 mg total) by mouth daily. 30 tablet 3  . betamethasone dipropionate 0.05 % lotion APPLY TOPICALLY 2 (TWO) TIMES DAILY. STOP USE X 1 WEEK AFTER EVERY 2 WEEKS OF DAILY USE. 60 mL 0  . carvedilol (COREG) 12.5 MG tablet Take 1 tablet (12.5 mg total) by mouth 2 (two) times daily with a meal. 60 tablet 3  . cetirizine (ZYRTEC) 10 MG tablet Take 1 tablet (10 mg total) by mouth daily. 30 tablet 11  . fluticasone (FLONASE) 50 MCG/ACT nasal spray SPRAY 2 SPRAYS INTO EACH NOSTRIL EVERY DAY 16 g 12  . hydrocortisone 2.5 % cream APPLY TO AFFECTED AREA TWICE A DAY AS NEEDED ON FACE  2  . isosorbide-hydrALAZINE (BIDIL) 20-37.5 MG tablet Take 1 tablet by mouth 3 (three) times daily. 90 tablet 3  . pantoprazole (PROTONIX) 40 MG tablet TAKE 1 TABLET BY MOUTH EVERY DAY 30 tablet 2  . verapamil (CALAN-SR) 240 MG CR tablet TAKE 1 TABLET (240 MG TOTAL) BY MOUTH AT BEDTIME. 30 tablet 2   No current facility-administered medications on file prior to visit.    Cardiovascular studies:  Renal artery duplex  12/16/2018: No evidence of renal artery occlusive disease in either renal artery. Normal intrarenal vascular perfusion is noted in both kidneys. Renal length is within normal limits for both kidneys. Simple cyst measuring 3x3cm noted.  Normal abdominal aorta flow velocities noted.  Echocardiogram 10/13/2018: Left ventricle cavity is normal in size. Moderate concentric  hypertrophy of the left ventricle. Normal LV systolic function with EF 62%. Normal global wall motion. Normal diastolic filling pattern.  Mild (Grade I) mitral regurgitation. Inadequate TR jet to estimate pulmonary artery systolic pressure. Normal right atrial pressure.  Recent labs: 12/22/2018: Glucose 89, BUN/Cr 11/1.13. EGFR 95. Na/K 139/4.1. Rest of the CMP normal Chol 156, TG 152, HDL 45, LDL 81  07/29/2017: H/H 14/45. MCV 76. Platelets  334  Review of Systems  Cardiovascular: Negative for chest pain, dyspnea on exertion, leg swelling, palpitations and syncope.       Vitals:   02/09/19 1339  BP: (!) 145/95     Objective:   Physical Exam  Not performed. Telephone visit.      Assessment & Recommendations:   39 year old African-American male with resistant hypertension, moderate OSA, family history of hypertension.  Resistant hypertension: Likely familial. No RAS on duplex. Renin/aldosterone lab pending Increase  carvedilol 12.5 mg to tid.  Continue verapamil 240 mg daily( Started by PCP in march 2020), Bidil 20-37.5 mg tid, amlodipine at 5 mg daily.   OSA: Recommend using CPAP for OSA.  F/u in 6 weeks.  Nigel Mormon, MD [facility] Pager: [phone] Office: [phone] If no answer Cell [phone]

## 2019-02-26 ENCOUNTER — Other Ambulatory Visit: Payer: Self-pay | Admitting: Family Medicine

## 2019-02-26 DIAGNOSIS — R12 Heartburn: Secondary | ICD-10-CM

## 2019-03-01 ENCOUNTER — Other Ambulatory Visit: Payer: Self-pay

## 2019-03-02 ENCOUNTER — Ambulatory Visit: Payer: 59 | Attending: Internal Medicine

## 2019-03-02 DIAGNOSIS — Z20822 Contact with and (suspected) exposure to covid-19: Secondary | ICD-10-CM

## 2019-03-04 LAB — NOVEL CORONAVIRUS, NAA: SARS-CoV-2, NAA: NOT DETECTED

## 2019-03-07 ENCOUNTER — Other Ambulatory Visit: Payer: Self-pay

## 2019-03-10 ENCOUNTER — Other Ambulatory Visit: Payer: Self-pay | Admitting: Cardiology

## 2019-03-10 DIAGNOSIS — I10 Essential (primary) hypertension: Secondary | ICD-10-CM

## 2019-03-11 ENCOUNTER — Other Ambulatory Visit: Payer: Self-pay | Admitting: Family Medicine

## 2019-03-11 ENCOUNTER — Telehealth: Payer: Self-pay

## 2019-03-11 DIAGNOSIS — I10 Essential (primary) hypertension: Secondary | ICD-10-CM

## 2019-03-11 NOTE — Telephone Encounter (Signed)
Forwarding medication refill request to the clinical pool for review. 

## 2019-03-11 NOTE — Telephone Encounter (Signed)
12.5 mg tid is correct.

## 2019-03-11 NOTE — Telephone Encounter (Signed)
Pt informed us that he is taking Carvedilol 12.5 TIB but i see BID please informed thank you

## 2019-03-17 ENCOUNTER — Encounter (HOSPITAL_COMMUNITY): Payer: Self-pay | Admitting: Emergency Medicine

## 2019-03-17 ENCOUNTER — Other Ambulatory Visit: Payer: Self-pay

## 2019-03-17 ENCOUNTER — Ambulatory Visit (HOSPITAL_COMMUNITY)
Admission: EM | Admit: 2019-03-17 | Discharge: 2019-03-17 | Disposition: A | Discharge status: Home / Self Care | Payer: 59 | Attending: Family Medicine | Admitting: Family Medicine

## 2019-03-17 DIAGNOSIS — R52 Pain, unspecified: Secondary | ICD-10-CM

## 2019-03-17 DIAGNOSIS — U071 COVID-19: Secondary | ICD-10-CM | POA: Insufficient documentation

## 2019-03-17 DIAGNOSIS — Z87891 Personal history of nicotine dependence: Secondary | ICD-10-CM | POA: Insufficient documentation

## 2019-03-17 DIAGNOSIS — Z886 Allergy status to analgesic agent status: Secondary | ICD-10-CM | POA: Insufficient documentation

## 2019-03-17 DIAGNOSIS — I10 Essential (primary) hypertension: Secondary | ICD-10-CM | POA: Diagnosis not present

## 2019-03-17 DIAGNOSIS — R509 Fever, unspecified: Secondary | ICD-10-CM

## 2019-03-17 NOTE — ED Triage Notes (Signed)
Pt here for body aches and chills; pt sts daughter tested positive for covid last week

## 2019-03-17 NOTE — ED Provider Notes (Signed)
[place]   [id number] 03/17/19 Arrival Time: 1950  ASSESSMENT & PLAN:  1. Generalized body aches   2. Fever and chills   3. Elevated blood pressure reading in office with diagnosis of hypertension      COVID-19 testing sent. See letter/work note on file for self-isolation guidelines.   Follow-up Information    [facility].   Specialty: Urgent Care Why: As needed. Contact information: [place] [phone]       Schedule an appointment as soon as possible for a visit  with Forrest Moron, MD.   Specialty: Internal Medicine Why: To recheck your blood pressure. Contact information: [place] [address] [phone]            Discharge Instructions     You have been tested for COVID-19 today. If your test returns positive, you will receive a phone call from [facility] regarding your results. Negative test results are not called. Both positive and negative results area always visible on MyChart. If you do not have a MyChart account, sign up instructions are provided in your discharge papers. Please do not hesitate to contact us should you have questions or concerns.  Your blood pressure was noted to be elevated during your visit today. You may return here within the next few days to recheck if unable to see your primary care doctor. If your blood pressure remains persistently elevated, you may need to begin taking a medication.  BP (!) 168/105 (BP Location: Left Arm)   Pulse 95   Temp 99.4 F (37.4 C) (Oral)   Resp 18   SpO2 96%       Reviewed expectations re: course of current medical issues. Questions answered. Outlined signs and symptoms indicating need for more acute intervention. Patient verbalized understanding. After Visit Summary given.   SUBJECTIVE: History from: patient. Eugene Gomez is a 39 y.o. male who requests COVID-19 testing.  Known COVID-19 contact: none recently; daughter tested positive at end of Jan. He tested negative at that time. Reports body aches and subj fever/chills starting yesterday/this am. No cough. Recent travel: none. Denies: sore throat, difficulty breathing and headache. Normal PO intake without n/v/d.    OBJECTIVE:  Vitals:   03/17/19 1906  BP: (!) 168/105  Pulse: 95  Resp: 18  Temp: 99.4 F (37.4 C)  TempSrc: Oral  SpO2: 96%    General appearance: alert; no distress Eyes: PERRLA; EOMI; conjunctiva normal HENT: [place]; AT; nasal mucosa normal; oral mucosa normal Neck: supple  Lungs: speaks full sentences without difficulty; unlabored Extremities: no edema Skin: warm and dry Neurologic: normal gait Psychological: alert and cooperative; normal mood and affect  Labs: Results for orders placed or performed in visit on 03/02/19  Novel Coronavirus, NAA ([facility])   Specimen: Nasopharyngeal(NP) swabs in vial transport medium   NASOPHARYNGE  TESTING  Result Value Ref Range   SARS-CoV-2, NAA Not Detected Not Detected   Labs Reviewed  NOVEL CORONAVIRUS, NAA (HOSP ORDER, SEND-OUT TO REF LAB; TAT 18-24 HRS)      Allergies  Allergen Reactions  . Aspirin Hives    Past Medical History:  Diagnosis Date  . Hypertension    Social History   Socioeconomic History  . Marital status: Married    Spouse name: Radiographer, therapeutic  . Number of children: 2  . Years of education: Associates  . Highest education level: Not on file  Occupational History  .  Occupation: Heavy Arts development officer: [employer]    Comment: (trash truck driver)  . Occupation: Night Job    Comment: [employer]  Tobacco Use  . Smoking status: Former Smoker    Packs/day: 2.00    Years: 10.00    Pack years: 20.00    Types: Cigars    Quit date: 2018    Years since quitting: 3.0  . Smokeless tobacco: Never Used  . Tobacco comment: smoked black and milds 1 a day   Substance and Sexual Activity  . Alcohol  use: Yes    Alcohol/week: 1.0 - 10.0 standard drinks    Types: 1 - 10 Standard drinks or equivalent per week    Comment: MIXED DRINKS  . Drug use: No  . Sexual activity: Yes    Partners: Female  Other Topics Concern  . Not on file  Social History Narrative   Lives with his wife and two daughters.   Social Determinants of Health   Financial Resource Strain:   . Difficulty of Paying Living Expenses: Not on file  Food Insecurity:   . Worried About Programme researcher, broadcasting/film/video in the Last Year: Not on file  . Ran Out of Food in the Last Year: Not on file  Transportation Needs:   . Lack of Transportation (Medical): Not on file  . Lack of Transportation (Non-Medical): Not on file  Physical Activity:   . Days of Exercise per Week: Not on file  . Minutes of Exercise per Session: Not on file  Stress:   . Feeling of Stress : Not on file  Social Connections:   . Frequency of Communication with Friends and Family: Not on file  . Frequency of Social Gatherings with Friends and Family: Not on file  . Attends Religious Services: Not on file  . Active Member of Clubs or Organizations: Not on file  . Attends Banker Meetings: Not on file  . Marital Status: Not on file  Intimate Partner Violence:   . Fear of Current or Ex-Partner: Not on file  . Emotionally Abused: Not on file  . Physically Abused: Not on file  . Sexually Abused: Not on file   Family History  Problem Relation Age of Onset  . Hypertension Mother   . Anemia Mother   . Hypertension Father   . Diabetes Father   . Hypertension Brother   . Drug abuse Brother   . Cancer Sister        brain  . Hypertension Maternal Aunt   . Hypertension Maternal Uncle   . Hypertension Paternal Aunt   . Glaucoma Paternal Aunt   . Diabetes Paternal Aunt   . Hypertension Paternal Uncle   . Diabetes Paternal Uncle    Past Surgical History:  Procedure Laterality Date  . DENTAL SURGERY Left   . WISDOM TOOTH EXTRACTION         Mardella Layman, MD 03/17/19 1919

## 2019-03-17 NOTE — Discharge Instructions (Addendum)
You have been tested for COVID-19 today. If your test returns positive, you will receive a phone call from [facility] regarding your results. Negative test results are not called. Both positive and negative results area always visible on MyChart. If you do not have a MyChart account, sign up instructions are provided in your discharge papers. Please do not hesitate to contact us should you have questions or concerns.  Your blood pressure was noted to be elevated during your visit today. You may return here within the next few days to recheck if unable to see your primary care doctor. If your blood pressure remains persistently elevated, you may need to begin taking a medication.  BP (!) 168/105 (BP Location: Left Arm)   Pulse 95   Temp 99.4 F (37.4 C) (Oral)   Resp 18   SpO2 96%

## 2019-03-18 ENCOUNTER — Ambulatory Visit: Payer: 59 | Admitting: Cardiology

## 2019-03-19 LAB — NOVEL CORONAVIRUS, NAA (HOSP ORDER, SEND-OUT TO REF LAB; TAT 18-24 HRS): SARS-CoV-2, NAA: DETECTED — AB

## 2019-03-20 ENCOUNTER — Telehealth: Payer: Self-pay | Admitting: Unknown Physician Specialty

## 2019-03-20 NOTE — Telephone Encounter (Signed)
Discussed with patient about Covid symptoms and the use of bamlanivimab, a monoclonal antibody infusion for those with mild to moderate Covid symptoms and at a high risk of hospitalization.  Pt is qualified for this infusion at the [facility] infusion center due to BMI>35   Left information and sent mychart.  Pt will think about treatment

## 2019-03-27 NOTE — Progress Notes (Signed)
Rescheduled

## 2019-03-28 ENCOUNTER — Ambulatory Visit (INDEPENDENT_AMBULATORY_CARE_PROVIDER_SITE_OTHER): Payer: 59 | Admitting: Cardiology

## 2019-03-28 DIAGNOSIS — I10 Essential (primary) hypertension: Secondary | ICD-10-CM

## 2019-03-28 DIAGNOSIS — G4733 Obstructive sleep apnea (adult) (pediatric): Secondary | ICD-10-CM | POA: Diagnosis not present

## 2019-04-06 ENCOUNTER — Ambulatory Visit: Payer: Self-pay | Admitting: Adult Health

## 2019-04-07 ENCOUNTER — Other Ambulatory Visit: Payer: Self-pay

## 2019-04-07 DIAGNOSIS — I10 Essential (primary) hypertension: Secondary | ICD-10-CM

## 2019-04-07 MED ORDER — CARVEDILOL 12.5 MG PO TABS: 12.5000 mg | ORAL_TABLET | Freq: Three times a day (TID) | ORAL | 3 refills | Status: DC

## 2019-04-12 ENCOUNTER — Telehealth: Payer: Self-pay | Admitting: Neurology

## 2019-04-12 NOTE — Telephone Encounter (Signed)
I have reached out to the [facility] to determine what information is needed.

## 2019-04-12 NOTE — Telephone Encounter (Signed)
Pt has called to report that he just spoke with [facility] and is being told they need notes from Dr Frances Furbish because it has been so long since the initial order was sent to them. Please reply

## 2019-04-14 ENCOUNTER — Other Ambulatory Visit: Payer: Self-pay

## 2019-04-14 DIAGNOSIS — G4733 Obstructive sleep apnea (adult) (pediatric): Secondary | ICD-10-CM

## 2019-04-24 ENCOUNTER — Other Ambulatory Visit: Payer: Self-pay | Admitting: Cardiology

## 2019-04-24 DIAGNOSIS — I10 Essential (primary) hypertension: Secondary | ICD-10-CM

## 2019-04-25 NOTE — Telephone Encounter (Signed)
Ok to refill, or wait for appt on the 24th of March. Please advise.

## 2019-04-25 NOTE — Telephone Encounter (Signed)
If he has enough, I will refill at next week visit. Please check with the patient.  Thanks MJP

## 2019-04-29 NOTE — Progress Notes (Signed)
Patient referred by Forrest Moron, MD for treatment resistant HTN   Subjective:   Eugene Gomez, male    DOB: [birth year], 39 y.o.   MRN: [id number]   Chief Complaint  Patient presents with  . Hypertension    Follow up    39 y/o [place] American male with resistant hypertension.  He has not tolerated amlodipine 10 mg due to leg edema, and lisinopril due to cough, spironolactone due to breast tenderness.   He is currently on carvedilol 12.5 mg tid, verapamil 240 mg daily( Started by PCP in march 2020), Bidil 20-37.5 mg 1 tab tid, and amlodipine at a lower dose of 5 mg. He was diagnosed with moderate OSA and recommended CPA, and is awaiting initiation of CPAP-which will finally occur this Wednesday 3/24.   His blood pressure remains suboptimal, but has improved compared to his initial visits. He has noticed increased swelling in his hands and legs.    Current Outpatient Medications on File Prior to Visit  Medication Sig Dispense Refill  . amLODipine (NORVASC) 5 MG tablet Take 1 tablet (5 mg total) by mouth daily. 30 tablet 3  . betamethasone dipropionate 0.05 % lotion APPLY TOPICALLY 2 (TWO) TIMES DAILY. STOP USE X 1 WEEK AFTER EVERY 2 WEEKS OF DAILY USE. 60 mL 0  . BIDIL 20-37.5 MG tablet TAKE 1 TABLET BY MOUTH THREE TIMES A DAY 90 tablet 3  . carvedilol (COREG) 12.5 MG tablet Take 1 tablet (12.5 mg total) by mouth in the morning, at noon, and at bedtime. 90 tablet 3  . pantoprazole (PROTONIX) 40 MG tablet TAKE 1 TABLET BY MOUTH EVERY DAY 30 tablet 2  . verapamil (CALAN-SR) 240 MG CR tablet TAKE 1 TABLET (240 MG TOTAL) BY MOUTH AT BEDTIME. 30 tablet 2  . cetirizine (ZYRTEC) 10 MG tablet Take 1 tablet (10 mg total) by mouth daily. (Patient not taking: Reported on 05/02/2019) 30 tablet 11  . fluticasone (FLONASE) 50 MCG/ACT nasal spray SPRAY 2 SPRAYS INTO EACH NOSTRIL EVERY DAY (Patient not taking: Reported on 05/02/2019) 16 g 12  . hydrocortisone 2.5 % cream APPLY TO AFFECTED AREA  TWICE A DAY AS NEEDED ON FACE  2   No current facility-administered medications on file prior to visit.    Cardiovascular studies:  EKG 05/02/2019: Sinus rhythm 55 bpm. Occasional PAC     Renal artery duplex  12/16/2018: No evidence of renal artery occlusive disease in either renal artery. Normal intrarenal vascular perfusion is noted in both kidneys. Renal length is within normal limits for both kidneys. Simple cyst measuring 3x3cm noted.  Normal abdominal aorta flow velocities noted.  Echocardiogram 10/13/2018: Left ventricle cavity is normal in size. Moderate concentric hypertrophy of the left ventricle. Normal LV systolic function with EF 62%. Normal global wall motion. Normal diastolic filling pattern.  Mild (Grade I) mitral regurgitation. Inadequate TR jet to estimate pulmonary artery systolic pressure. Normal right atrial pressure.  Recent labs: 12/22/2018: Glucose 89, BUN/Cr 11/1.13. EGFR 95. Na/K 139/4.1. Rest of the CMP normal Chol 156, TG 152, HDL 45, LDL 81  07/29/2017: H/H 14/45. MCV 76. Platelets 334  Review of Systems  Cardiovascular: Negative for chest pain, dyspnea on exertion, leg swelling, palpitations and syncope.       Vitals:   05/02/19 1022 05/02/19 1025  BP: (!) 171/110 (!) 161/110  Pulse: (!) 58 60  Temp: 98.7 F (37.1 C)   SpO2: 99%      Objective:   Physical Exam  Constitutional:  He appears well-developed and well-nourished.  Neck: No JVD present.  Cardiovascular: Normal rate, regular rhythm, normal heart sounds and intact distal pulses.  No murmur heard. Pulmonary/Chest: Effort normal and breath sounds normal. He has no wheezes. He has no rales.  Musculoskeletal:        General: Edema (1+ b/l) present.  Nursing note and vitals reviewed.      Assessment & Recommendations:   39 year old African-American male with resistant hypertension, moderate OSA, family history of hypertension.  Resistant hypertension: Likely familial. No  RAS on duplex. Renin/aldosterone lab pending Increase  carvedilol to 25 mg bid. Started chlorthalidone 12.5 mg daily.  Continue verapamil 240 mg daily( Started by PCP in march 2020), Bidil 20-37.5 mg tid, amlodipine at 5 mg daily.   OSA: Recommend using CPAP for OSA.  BMP in 2 weeks. Virtual f/u in 4 weeks.  Nigel Mormon, MD [facility] Pager: [phone] Office: [phone] If no answer Cell [phone]

## 2019-04-30 ENCOUNTER — Other Ambulatory Visit: Payer: Self-pay | Admitting: Cardiology

## 2019-04-30 DIAGNOSIS — I10 Essential (primary) hypertension: Secondary | ICD-10-CM

## 2019-05-02 ENCOUNTER — Ambulatory Visit: Payer: 59 | Admitting: Cardiology

## 2019-05-02 ENCOUNTER — Encounter: Payer: Self-pay | Admitting: Cardiology

## 2019-05-02 ENCOUNTER — Other Ambulatory Visit: Payer: Self-pay

## 2019-05-02 VITALS — BP 161/110 | HR 60 | Temp 98.7°F | Ht 72.0 in | Wt 235.0 lb

## 2019-05-02 DIAGNOSIS — I10 Essential (primary) hypertension: Secondary | ICD-10-CM

## 2019-05-02 DIAGNOSIS — G4733 Obstructive sleep apnea (adult) (pediatric): Secondary | ICD-10-CM

## 2019-05-02 MED ORDER — CARVEDILOL 25 MG PO TABS: 25.0000 mg | ORAL_TABLET | Freq: Two times a day (BID) | ORAL | 2 refills | Status: DC

## 2019-05-02 MED ORDER — CHLORTHALIDONE 25 MG PO TABS: 12.5000 mg | ORAL_TABLET | Freq: Every day | ORAL | 3 refills | Status: DC

## 2019-05-25 LAB — BASIC METABOLIC PANEL
BUN/Creatinine Ratio: 7 — ABNORMAL LOW (ref 9–20)
BUN: 8 mg/dL (ref 6–20)
CO2: 27 mmol/L (ref 20–29)
Calcium: 10.1 mg/dL (ref 8.7–10.2)
Chloride: 103 mmol/L (ref 96–106)
Creatinine, Ser: 1.14 mg/dL (ref 0.76–1.27)
GFR calc Af Amer: 94 mL/min/{1.73_m2} (ref >59)
GFR calc non Af Amer: 82 mL/min/{1.73_m2} (ref >59)
Glucose: 94 mg/dL (ref 65–99)
Potassium: 3.8 mmol/L (ref 3.5–5.2)
Sodium: 142 mmol/L (ref 134–144)

## 2019-05-25 LAB — ALDOSTERONE + RENIN ACTIVITY W/ RATIO
ALDOS/RENIN RATIO: 56.5 — ABNORMAL HIGH (ref 0.0–30.0)
ALDOSTERONE: 11.3 ng/dL (ref 0.0–30.0)
Renin: 0.2 ng/mL/hr (ref 0.167–5.380)

## 2019-05-28 ENCOUNTER — Other Ambulatory Visit: Payer: Self-pay | Admitting: Family Medicine

## 2019-05-28 DIAGNOSIS — R12 Heartburn: Secondary | ICD-10-CM

## 2019-06-01 ENCOUNTER — Encounter: Payer: Self-pay | Admitting: Cardiology

## 2019-06-01 ENCOUNTER — Telehealth: Payer: 59 | Admitting: Cardiology

## 2019-06-01 ENCOUNTER — Other Ambulatory Visit: Payer: Self-pay

## 2019-06-01 VITALS — BP 153/94 | HR 83 | Ht 72.0 in | Wt 250.0 lb

## 2019-06-01 DIAGNOSIS — G4733 Obstructive sleep apnea (adult) (pediatric): Secondary | ICD-10-CM

## 2019-06-01 DIAGNOSIS — I10 Essential (primary) hypertension: Secondary | ICD-10-CM

## 2019-06-01 MED ORDER — CHLORTHALIDONE 25 MG PO TABS: 25.0000 mg | ORAL_TABLET | Freq: Every day | ORAL | 3 refills | Status: DC

## 2019-06-01 NOTE — Progress Notes (Addendum)
Patient referred by Forrest Moron, MD for treatment resistant HTN   Subjective:   Eugene Gomez, male    DOB: Mar 14, 1980, 39 y.o.   MRN: [id number]  I connected with the patient on 06/01/2019 by a telephone call and verified that I am speaking with the correct person using two identifiers.     I offered the patient a video enabled application for a virtual visit. Unfortunately, this could not be accomplished due to technical difficulties/lack of video enabled phone/computer. I discussed the limitations of evaluation and management by telemedicine and the availability of in person appointments. The patient expressed understanding and agreed to proceed.   This visit type was conducted due to national recommendations for restrictions regarding the COVID-19 Pandemic (e.g. social distancing).  This format is felt to be most appropriate for this patient at this time.  All issues noted in this document were discussed and addressed.  No physical exam was performed (except for noted visual exam findings with Tele health visits).  The patient has consented to conduct a Tele health visit and understands insurance will be billed.   Chief Complaint  Patient presents with  . Hypertension  . Follow-up    4 week    39 y/o [place] American male with resistant hypertension.  He has not tolerated amlodipine 10 mg due to leg edema, and lisinopril due to cough, spironolactone due to breast tenderness.   He admits to not checking his BP regularly. He continues to have leg swelling. He is now on CPAP therapy for OSA.   Current Outpatient Medications on File Prior to Visit  Medication Sig Dispense Refill  . amLODipine (NORVASC) 5 MG tablet TAKE 1 TABLET BY MOUTH EVERY DAY 30 tablet 3  . betamethasone dipropionate 0.05 % lotion APPLY TOPICALLY 2 (TWO) TIMES DAILY. STOP USE X 1 WEEK AFTER EVERY 2 WEEKS OF DAILY USE. 60 mL 0  . BIDIL 20-37.5 MG tablet TAKE 1 TABLET BY MOUTH THREE TIMES A DAY 90 tablet 3  .  carvedilol (COREG) 25 MG tablet Take 1 tablet (25 mg total) by mouth 2 (two) times daily with a meal. 60 tablet 2  . cetirizine (ZYRTEC) 10 MG tablet Reported on 05/02/2019    . chlorthalidone (HYGROTON) 25 MG tablet Take 0.5 tablets (12.5 mg total) by mouth daily. 30 tablet 3  . fluticasone (FLONASE) 50 MCG/ACT nasal spray SPRAY 2 SPRAYS INTO EACH NOSTRIL EVERY DAY 16 g 12  . pantoprazole (PROTONIX) 40 MG tablet TAKE 1 TABLET BY MOUTH EVERY DAY 90 tablet 0  . verapamil (CALAN-SR) 240 MG CR tablet TAKE 1 TABLET (240 MG TOTAL) BY MOUTH AT BEDTIME. 30 tablet 2   No current facility-administered medications on file prior to visit.    Cardiovascular studies:  EKG 05/02/2019: Sinus rhythm 55 bpm. Occasional PAC     Renal artery duplex  12/16/2018: No evidence of renal artery occlusive disease in either renal artery. Normal intrarenal vascular perfusion is noted in both kidneys. Renal length is within normal limits for both kidneys. Simple cyst measuring 3x3cm noted.  Normal abdominal aorta flow velocities noted.  Echocardiogram 10/13/2018: Left ventricle cavity is normal in size. Moderate concentric hypertrophy of the left ventricle. Normal LV systolic function with EF 62%. Normal global wall motion. Normal diastolic filling pattern.  Mild (Grade I) mitral regurgitation. Inadequate TR jet to estimate pulmonary artery systolic pressure. Normal right atrial pressure.  Recent labs: 05/18/2019: Glucose 94, BUN/Cr 8/1.14. EGFR 94. Na/K 142/3.8.  Renin 0.2 (  normal) Aldosterone 11.3 (normal) Aldosterone/Renin 56.5 (elevated)   12/22/2018: Glucose 89, BUN/Cr 11/1.13. EGFR 95. Na/K 139/4.1. Rest of the CMP normal Chol 156, TG 152, HDL 45, LDL 81  07/29/2017: H/H 14/45. MCV 76. Platelets 334  Review of Systems  Cardiovascular: Positive for leg swelling. Negative for chest pain, dyspnea on exertion, palpitations and syncope.       Vitals:   06/01/19 1556  BP: (!) 153/94  Pulse: 83      Objective:   Physical Exam     Not performed. Telephone visit.    Assessment & Recommendations:   39 year old African-American male with resistant hypertension, moderate OSA, family history of hypertension.  Resistant hypertension: Likely familial. No RAS on duplex. Renin/aldosterone do not suggest hyperaldosteronism, Although remains suboptimal, blood pressure is much improved.  Currently on carvedilol to 25 mg bid. chlorthalidone 12.5 mg daily, verapamil 240 mg daily(Started by PCP in march 2020), Bidil 20-37.5 mg tid, amlodipine at 5 mg daily.  Increase chlorthalidone to 25 mg daily. Encouraged keeping regular log of hib BP readings and bringing it to his next visit.  OSA: Recommend using CPAP for OSA.  F/u in 2 months   Megen Madewell Esther Hardy, MD [facility] Pager: [phone] Office: [phone] If no answer Cell [phone]

## 2019-06-02 ENCOUNTER — Encounter: Payer: Self-pay | Admitting: Cardiology

## 2019-06-19 ENCOUNTER — Other Ambulatory Visit: Payer: Self-pay | Admitting: Family Medicine

## 2019-06-19 DIAGNOSIS — I10 Essential (primary) hypertension: Secondary | ICD-10-CM

## 2019-06-19 NOTE — Telephone Encounter (Signed)
Requested medications are due for refill today?  Yes  Requested medications are on active medication list?  Yes  Last Refill:  07/20/2018  # 30 with 2 refills   Future visit scheduled?  No   Notes to Clinic:  Medication failed RX refill protocol due to no valid encounter in the past 6 months.   Last visit with provider was 11 months ago.

## 2019-06-20 NOTE — Telephone Encounter (Signed)
No further refills without office visit 

## 2019-07-29 ENCOUNTER — Other Ambulatory Visit: Payer: Self-pay | Admitting: Cardiology

## 2019-07-29 DIAGNOSIS — I10 Essential (primary) hypertension: Secondary | ICD-10-CM

## 2019-08-29 ENCOUNTER — Other Ambulatory Visit: Payer: Self-pay | Admitting: Cardiology

## 2019-08-29 DIAGNOSIS — I10 Essential (primary) hypertension: Secondary | ICD-10-CM

## 2019-09-26 ENCOUNTER — Other Ambulatory Visit: Payer: Self-pay | Admitting: Cardiology

## 2019-09-26 DIAGNOSIS — I10 Essential (primary) hypertension: Secondary | ICD-10-CM

## 2019-11-01 ENCOUNTER — Other Ambulatory Visit: Payer: Self-pay | Admitting: Cardiology

## 2019-11-01 DIAGNOSIS — I10 Essential (primary) hypertension: Secondary | ICD-10-CM

## 2019-12-21 ENCOUNTER — Other Ambulatory Visit: Payer: Self-pay | Admitting: Cardiology

## 2019-12-21 DIAGNOSIS — I10 Essential (primary) hypertension: Secondary | ICD-10-CM

## 2020-02-21 ENCOUNTER — Ambulatory Visit: Payer: 59 | Attending: Internal Medicine

## 2020-02-21 DIAGNOSIS — Z23 Encounter for immunization: Secondary | ICD-10-CM

## 2020-02-21 NOTE — Progress Notes (Signed)
   Covid-19 Vaccination Clinic  Name:  Eugene Gomez    MRN: [id number] DOB: Jul 08, 1980  02/21/2020  Mr. Eugene Gomez was observed post Covid-19 immunization for 15 minutes without incident. He was provided with Vaccine Information Sheet and instruction to access the V-Safe system.   Mr. Eugene Gomez was instructed to call 911 with any severe reactions post vaccine: Marland Kitchen Difficulty breathing  . Swelling of face and throat  . A fast heartbeat  . A bad rash all over body  . Dizziness and weakness   Immunizations Administered    Name Date Dose VIS Date Route   Pfizer COVID-19 Vaccine 02/21/2020  5:37 PM 0.3 mL 11/30/2019 Intramuscular   Manufacturer: [employer], [employer]   Lot: [id number]   NDC: [address]-6

## 2020-02-23 ENCOUNTER — Other Ambulatory Visit: Payer: Self-pay | Admitting: Cardiology

## 2020-02-23 DIAGNOSIS — I10 Essential (primary) hypertension: Secondary | ICD-10-CM

## 2020-05-17 ENCOUNTER — Other Ambulatory Visit (HOSPITAL_BASED_OUTPATIENT_CLINIC_OR_DEPARTMENT_OTHER)
Admission: RE | Admit: 2020-05-17 | Discharge: 2020-05-17 | Disposition: A | Discharge status: Home / Self Care | Payer: 59 | Source: Ambulatory Visit | Attending: Family Medicine | Admitting: Family Medicine

## 2020-05-17 ENCOUNTER — Ambulatory Visit (HOSPITAL_BASED_OUTPATIENT_CLINIC_OR_DEPARTMENT_OTHER): Payer: 59 | Admitting: Family Medicine

## 2020-05-17 ENCOUNTER — Encounter (HOSPITAL_BASED_OUTPATIENT_CLINIC_OR_DEPARTMENT_OTHER): Payer: Self-pay | Admitting: Family Medicine

## 2020-05-17 ENCOUNTER — Other Ambulatory Visit: Payer: Self-pay

## 2020-05-17 VITALS — BP 128/98 | HR 84 | Ht 72.0 in | Wt 248.2 lb

## 2020-05-17 DIAGNOSIS — G4733 Obstructive sleep apnea (adult) (pediatric): Secondary | ICD-10-CM | POA: Diagnosis not present

## 2020-05-17 DIAGNOSIS — I10 Essential (primary) hypertension: Secondary | ICD-10-CM | POA: Diagnosis present

## 2020-05-17 DIAGNOSIS — Z6833 Body mass index (BMI) 33.0-33.9, adult: Secondary | ICD-10-CM | POA: Insufficient documentation

## 2020-05-17 DIAGNOSIS — I1A0 Resistant hypertension: Secondary | ICD-10-CM

## 2020-05-17 DIAGNOSIS — R12 Heartburn: Secondary | ICD-10-CM

## 2020-05-17 DIAGNOSIS — L0291 Cutaneous abscess, unspecified: Secondary | ICD-10-CM

## 2020-05-17 LAB — HEMOGLOBIN A1C
Hgb A1c MFr Bld: 5.7 % — ABNORMAL HIGH (ref 4.8–5.6)
Mean Plasma Glucose: 116.89 mg/dL

## 2020-05-17 LAB — COMPREHENSIVE METABOLIC PANEL
ALT: 28 U/L (ref 0–44)
AST: 16 U/L (ref 15–41)
Albumin: 4.3 g/dL (ref 3.5–5.0)
Alkaline Phosphatase: 88 U/L (ref 38–126)
Anion gap: 8 (ref 5–15)
BUN: 13 mg/dL (ref 6–20)
CO2: 29 mmol/L (ref 22–32)
Calcium: 9.7 mg/dL (ref 8.9–10.3)
Chloride: 105 mmol/L (ref 98–111)
Creatinine, Ser: 1.16 mg/dL (ref 0.61–1.24)
GFR, Estimated: >60 mL/min (ref >60)
Glucose, Bld: 70 mg/dL (ref 70–99)
Potassium: 3.3 mmol/L — ABNORMAL LOW (ref 3.5–5.1)
Sodium: 142 mmol/L (ref 135–145)
Total Bilirubin: 0.4 mg/dL (ref 0.3–1.2)
Total Protein: 7.8 g/dL (ref 6.5–8.1)

## 2020-05-17 LAB — LIPID PANEL
Cholesterol: 111 mg/dL (ref 0–200)
HDL: 35 mg/dL — ABNORMAL LOW (ref >40)
LDL Cholesterol: 55 mg/dL (ref 0–99)
Total CHOL/HDL Ratio: 3.2 RATIO
Triglycerides: 103 mg/dL (ref <150)
VLDL: 21 mg/dL (ref 0–40)

## 2020-05-17 LAB — CBC WITH DIFFERENTIAL/PLATELET
Abs Immature Granulocytes: 0.02 10*3/uL (ref 0.00–0.07)
Basophils Absolute: 0 10*3/uL (ref 0.0–0.1)
Basophils Relative: 1 %
Eosinophils Absolute: 0.1 10*3/uL (ref 0.0–0.5)
Eosinophils Relative: 2 %
HCT: 44.9 % (ref 39.0–52.0)
Hemoglobin: 14.2 g/dL (ref 13.0–17.0)
Immature Granulocytes: 0 %
Lymphocytes Relative: 39 %
Lymphs Abs: 2.5 10*3/uL (ref 0.7–4.0)
MCH: 25.4 pg — ABNORMAL LOW (ref 26.0–34.0)
MCHC: 31.6 g/dL (ref 30.0–36.0)
MCV: 80.5 fL (ref 80.0–100.0)
Monocytes Absolute: 0.6 10*3/uL (ref 0.1–1.0)
Monocytes Relative: 9 %
Neutro Abs: 3.3 10*3/uL (ref 1.7–7.7)
Neutrophils Relative %: 49 %
Platelets: 336 10*3/uL (ref 150–400)
RBC: 5.58 MIL/uL (ref 4.22–5.81)
RDW: 15.8 % — ABNORMAL HIGH (ref 11.5–15.5)
WBC: 6.6 10*3/uL (ref 4.0–10.5)
nRBC: 0 % (ref 0.0–0.2)

## 2020-05-17 MED ORDER — VERAPAMIL HCL ER 240 MG PO TBCR: 240.0000 mg | EXTENDED_RELEASE_TABLET | Freq: Every day | ORAL | 2 refills | Status: DC

## 2020-05-17 MED ORDER — PANTOPRAZOLE SODIUM 40 MG PO TBEC: 1.0000 | DELAYED_RELEASE_TABLET | Freq: Every day | ORAL | 3 refills | Status: DC

## 2020-05-17 MED ORDER — SULFAMETHOXAZOLE-TRIMETHOPRIM 800-160 MG PO TABS
1.0000 | ORAL_TABLET | Freq: Two times a day (BID) | ORAL | 0 refills | Status: DC
Start: 2020-05-17 — End: 2020-07-04

## 2020-05-17 NOTE — Progress Notes (Signed)
New Patient Office Visit  Subjective:  Patient ID: Eugene Gomez, male    DOB: 09/25/1980  Age: 40 y.o. MRN: [id number]  CC:  Chief Complaint  Patient presents with  . Establish Care  . Gastroesophageal Reflux    Historically on pantoprazole but rx expired  . Diarrhea    Episode over the weekend for 2 days. Not pt states he feels "bloated and gassy"   . Recurrent Skin Infections    Boil on left inner groin area. Erupted yesterday but painful    HPI Eugene Gomez is a 40 year old male presenting to establish in clinic.  Reports current concerns related to abscess of left groin, needing refills on medications.  Patient with past medical history of hypertension, sleep apnea, GERD.  Patient admits to inconsistent follow-up with providers since the beginning of the pandemic but is trying to rectify this now.  Abscess: Recently noticed swelling in the area of his left groin with pain.  Had applied topical alcohol to the area using gauze.  Then yesterday, noted some wetness and a foul odor and saw the pus had been expressed from the area.  Denies any systemic symptoms such as fevers, chills, night sweats.  Recalls maybe 1 prior episode of an abscess.  Hypertension: Has seen cardiology in the past, it has been about 1 year since last appointment with them.  Patient has had treatment resistant hypertension and currently is on amlodipine, BiDil, carvedilol, verapamil, chlorthalidone.  He is currently needing a refill on the verapamil.  He has not tolerated other medications including ACE inhibitor.  Is needing to follow-up with cardiology.  Denies any issues with chest pain, shortness of breath, vision changes.  Sleep apnea: Was following with sleep medicine specialist, has not seen this provider in over a year.  Still has machine at home has not been using consistently.  GERD: Has been managed with pantoprazole with good relief, but has been out of this medication.  Requesting refill of pantoprazole  today.  Past Medical History:  Diagnosis Date  . Hypertension     Past Surgical History:  Procedure Laterality Date  . DENTAL SURGERY Left   . WISDOM TOOTH EXTRACTION      Family History  Problem Relation Age of Onset  . Hypertension Mother   . Anemia Mother   . Hypertension Father   . Diabetes Father   . Hypertension Brother   . Drug abuse Brother   . Cancer Sister        brain  . Hypertension Maternal Aunt   . Hypertension Maternal Uncle   . Hypertension Paternal Aunt   . Glaucoma Paternal Aunt   . Diabetes Paternal Aunt   . Hypertension Paternal Uncle   . Diabetes Paternal Uncle     Social History   Socioeconomic History  . Marital status: Married    Spouse name: Therapist, occupational  . Number of children: 2  . Years of education: Associates  . Highest education level: Not on file  Occupational History  . Occupation: Heavy Arts development officer: [employer]    Comment: (trash truck driver)  . Occupation: Night Job    Comment: [employer]  Tobacco Use  . Smoking status: Former Smoker    Packs/day: 2.00    Years: 10.00    Pack years: 20.00    Types: Cigars    Quit date: 2018    Years since quitting: 4.2  . Smokeless tobacco: Never Used  . Tobacco comment: smoked  black and milds 1 a day   Vaping Use  . Vaping Use: Never used  Substance and Sexual Activity  . Alcohol use: Yes    Alcohol/week: 1.0 - 10.0 standard drink    Types: 1 - 10 Standard drinks or equivalent per week    Comment: MIXED DRINKS  . Drug use: No  . Sexual activity: Yes    Partners: Female  Other Topics Concern  . Not on file  Social History Narrative   Lives with his wife and two daughters.   Social Determinants of Health   Financial Resource Strain: Not on file  Food Insecurity: Not on file  Transportation Needs: Not on file  Physical Activity: Not on file  Stress: Not on file  Social Connections: Not on file  Intimate Partner Violence: Not on file    Objective:    Today's Vitals: BP (!) 128/98   Pulse 84   Ht 6' (1.829 m)   Wt 248 lb 3.2 oz (112.6 kg)   SpO2 96%   BMI 33.66 kg/m   Physical Exam  Pleasant 40 year old male in no acute distress Cardiovascular exam with regular rate and rhythm, no murmurs appreciated Lungs clear to auscultation bilaterally Left groin: Area of induration measuring about 2.5 cm in length, 1 cm in width.  Healed skin overlying the area.  Moderate tenderness to palpation.  Assessment & Plan:   Problem List Items Addressed This Visit      Cardiovascular and Mediastinum   Resistant hypertension - Primary    Chronic, not at goal with elevated diastolic blood pressure in office today Refill of verapamil provided Patient to schedule follow-up with cardiology who he has not seen about 1 year Check labs today      Relevant Medications   verapamil (CALAN-SR) 240 MG CR tablet   Other Relevant Orders   Hemoglobin A1c   CBC with Differential/Platelet (Completed)   Comprehensive metabolic panel   Lipid panel     Respiratory   OSA (obstructive sleep apnea)    Not using machine consistently Encourage regular use due to risk associated with noncompliance including worsening of chronic medical conditions, difficulty controlling blood pressure, risk of obesity, development of diabetes, risk of sudden death Encourage patient to follow-up with sleep medicine center as well      Relevant Orders   Hemoglobin A1c   CBC with Differential/Platelet (Completed)   Lipid panel     Other   BMI 33.0-33.9,adult    Discussed lifestyle modifications, recommend healthy diet, increase in weekly aerobic exercise Discussed importance of adhering to CPAP as untreated sleep apnea can make it difficult to lose weight Monitor weight at follow-up visits      Relevant Orders   Hemoglobin A1c   CBC with Differential/Platelet (Completed)   Comprehensive metabolic panel   Lipid panel   Heartburn    Chronic, not at goal Refill  pantoprazole Monitor response on reinitiation of medication      Relevant Medications   pantoprazole (PROTONIX) 40 MG tablet   Abscess    New, active Incision and drainage completed today, see procedure note below Due to surrounding induration and infection, will treat with 1 week course of antibiotics, sent to pharmacy Monitor for systemic symptoms, seek medical care for any worsening Plan for close follow-up to monitor healing      Relevant Medications   sulfamethoxazole-trimethoprim (BACTRIM DS) 800-160 MG tablet    Other Visit Diagnoses    Uncontrolled hypertension  Relevant Medications   verapamil (CALAN-SR) 240 MG CR tablet     Procedure note: Incision and drainage of abscess Performed by: Jaysin Gayler de [place] Verbal informed consent obtained, risks and benefits discussed Timeout taken to confirm patient, procedure, equipment, site/side Location details: Left groin proximally Anesthesia: Local infiltration with lidocaine 1% without epinephrine Anesthetic total: 3 mL Description: Patient presented with abscess on left groin.  Incision was made overlying the abscess.  Very small amount of purulent material was expressed.  Abscess was explored using hemostat. Cavity with depth of about 1.5 cm.  Wound was irrigated with sterile saline.  No packing required.  This was covered with 4 x 4 gauze and paper tape to protect the area and allow for continued drainage.  Patient was instructed on proper care and precautions.  Blood loss minimal.  Patient tolerated procedure extremely well with no immediate complications.  Outpatient Encounter Medications as of 05/17/2020  Medication Sig  . sulfamethoxazole-trimethoprim (BACTRIM DS) 800-160 MG tablet Take 1 tablet by mouth 2 (two) times daily.  Marland Kitchen amLODipine (NORVASC) 5 MG tablet TAKE 1 TABLET BY MOUTH EVERY DAY  . betamethasone dipropionate 0.05 % lotion APPLY TOPICALLY 2 (TWO) TIMES DAILY. STOP USE X 1 WEEK AFTER EVERY 2 WEEKS OF DAILY USE.   Marland Kitchen BIDIL 20-37.5 MG tablet TAKE 1 TABLET BY MOUTH THREE TIMES A DAY  . carvedilol (COREG) 25 MG tablet TAKE 1 TABLET (25 MG TOTAL) BY MOUTH 2 (TWO) TIMES DAILY WITH A MEAL.  . cetirizine (ZYRTEC) 10 MG tablet Reported on 05/02/2019  . chlorthalidone (HYGROTON) 25 MG tablet Take 1 tablet (25 mg total) by mouth daily.  . fluticasone (FLONASE) 50 MCG/ACT nasal spray SPRAY 2 SPRAYS INTO EACH NOSTRIL EVERY DAY  . pantoprazole (PROTONIX) 40 MG tablet Take 1 tablet (40 mg total) by mouth daily.  . verapamil (CALAN-SR) 240 MG CR tablet Take 1 tablet (240 mg total) by mouth at bedtime.  . [DISCONTINUED] pantoprazole (PROTONIX) 40 MG tablet TAKE 1 TABLET BY MOUTH EVERY DAY  . [DISCONTINUED] verapamil (CALAN-SR) 240 MG CR tablet TAKE 1 TABLET (240 MG TOTAL) BY MOUTH AT BEDTIME.   No facility-administered encounter medications on file as of 05/17/2020.   Spent 60 minutes on this patient encounter, including preparation, chart review, face-to-face counseling with patient and coordination of care, and documentation of encounter  Follow-up: Return in about 6 days (around 05/23/2020) for Follow Up.   Desmon Hitchner J De Peru, MD

## 2020-05-17 NOTE — Assessment & Plan Note (Signed)
New, active Incision and drainage completed today, see procedure note below Due to surrounding induration and infection, will treat with 1 week course of antibiotics, sent to pharmacy Monitor for systemic symptoms, seek medical care for any worsening Plan for close follow-up to monitor healing

## 2020-05-17 NOTE — Assessment & Plan Note (Signed)
Chronic, not at goal with elevated diastolic blood pressure in office today Refill of verapamil provided Patient to schedule follow-up with cardiology who he has not seen about 1 year Check labs today

## 2020-05-17 NOTE — Patient Instructions (Signed)
  Medication Instructions:  Your physician has recommended you make the following change in your medication: -- START Bactrim - Take 1 tablet by mouth twice daily for 7 days -- RX SENT TO PHARMACY --If you need a refill on any your medications before your next appointment, please call your pharmacy first. If no refills are authorized on file call the office.--  Lab Work: Your physician has recommended that you have lab work today: CBC, CMP, A1C, and Lipid Profile If you have labs (blood work) drawn today and your tests are completely normal, you will receive your results only by: Marland Kitchen MyChart Message (if you have MyChart) OR . A phone call from our staff. Please ensure you check your voicemail in the event that you authorized detailed messages to be left on a delegated number. If you have any lab test that is abnormal or we need to change your treatment, we will call you to review the results.  Follow-Up: Your next appointment:   Your physician recommends that you schedule a follow-up appointment in: 1 WEEK with Dr. De Peru  Thanks for letting us be apart of your health journey!!  Primary Care and Sports Medicine   Dr. de Peru and Shawna Clamp, DNP, AGNP  We recommend signing up for the patient portal called "MyChart".  Sign up information is provided on this After Visit Summary.  MyChart is used to connect with patients for Virtual Visits (Telemedicine).  Patients are able to view lab/test results, encounter notes, upcoming appointments, etc.  Non-urgent messages can be sent to your provider as well.   To learn more about what you can do with MyChart, go to [email or web address].

## 2020-05-17 NOTE — Assessment & Plan Note (Signed)
Chronic, not at goal Refill pantoprazole Monitor response on reinitiation of medication

## 2020-05-17 NOTE — Assessment & Plan Note (Signed)
Not using machine consistently Encourage regular use due to risk associated with noncompliance including worsening of chronic medical conditions, difficulty controlling blood pressure, risk of obesity, development of diabetes, risk of sudden death Encourage patient to follow-up with sleep medicine center as well

## 2020-05-17 NOTE — Assessment & Plan Note (Signed)
Discussed lifestyle modifications, recommend healthy diet, increase in weekly aerobic exercise Discussed importance of adhering to CPAP as untreated sleep apnea can make it difficult to lose weight Monitor weight at follow-up visits

## 2020-05-18 ENCOUNTER — Telehealth (HOSPITAL_BASED_OUTPATIENT_CLINIC_OR_DEPARTMENT_OTHER): Payer: Self-pay

## 2020-05-18 NOTE — Telephone Encounter (Signed)
Results released by Dr. De [place] and reviewed by patient via MyChart Instructed patient to contact the office with any questions or concerns. 

## 2020-05-18 NOTE — Telephone Encounter (Signed)
-----   Message from Hosie Poisson Peru, MD sent at 05/18/2020  8:39 AM EDT ----- Lipid panel with normal total cholesterol, normal "bad" cholesterol, slightly decreased "good" cholesterol.  Normal kidney and liver function.  Slightly low potassium, but otherwise electrolytes look good.  The low potassium is likely related to recent episodes of diarrhea.  I expect this should normalize on its own with resolution of GI issues.  Normal red blood cell and white blood cell count.  Hemoglobin A1c is a measure of the average blood sugar over the past 3 months, this value is slightly elevated above normal and would be classified in the "prediabetes" range.  Being in this range indicates risk of progression to diabetes.  Making lifestyle modifications including healthy diet (eating fresh fruits and vegetables, limiting processed foods and high glycemic index foods) and increasing aerobic exercise while working towards healthy weight loss are important component of lowering risk of developing diabetes.  There are also medication options to help with addressing this issue which we can discuss further at next appointment.

## 2020-05-23 ENCOUNTER — Other Ambulatory Visit: Payer: Self-pay

## 2020-05-23 ENCOUNTER — Encounter (HOSPITAL_BASED_OUTPATIENT_CLINIC_OR_DEPARTMENT_OTHER): Payer: Self-pay | Admitting: Family Medicine

## 2020-05-23 ENCOUNTER — Ambulatory Visit (HOSPITAL_BASED_OUTPATIENT_CLINIC_OR_DEPARTMENT_OTHER): Payer: 59 | Admitting: Family Medicine

## 2020-05-23 VITALS — BP 118/82 | HR 62 | Ht 72.0 in | Wt 250.8 lb

## 2020-05-23 DIAGNOSIS — R7303 Prediabetes: Secondary | ICD-10-CM | POA: Diagnosis not present

## 2020-05-23 DIAGNOSIS — L0291 Cutaneous abscess, unspecified: Secondary | ICD-10-CM | POA: Diagnosis not present

## 2020-05-23 DIAGNOSIS — I10 Essential (primary) hypertension: Secondary | ICD-10-CM

## 2020-05-23 NOTE — Assessment & Plan Note (Addendum)
New, active Recent hemoglobin A1c reveals prediabetes at 5.7% Discussed lifestyle modifications including gradual increase in physical activity level, working towards gradual weight loss, avoidance of high glycemic index foods with handout provided Plan for repeat hemoglobin A1c in about 6 months

## 2020-05-23 NOTE — Progress Notes (Signed)
    Procedures performed today:    None.  Independent interpretation of notes and tests performed by another provider:   None.  Brief History, Exam, Impression, and Recommendations:    Pleasant 40 year old male who is doing well today.  Reports that abscess that was drained at last visit has been healing well.  Denies any side effects from the antibiotics.  Also here to monitor blood pressure as well as review recent labs.  Denies any recent issues with chest pain or shortness of breath.  Most recent labs showed slightly elevated hemoglobin A1c in prediabetes range.  BP 118/82   Pulse 62   Ht 6' (1.829 m)   Wt 250 lb 12.8 oz (113.8 kg)   SpO2 98%   BMI 34.3 kg/m   40 year old male in no acute distress Cardiovascular exam with regular rate and rhythm, no murmurs appreciated Lungs clear to auscultation bilaterally Left inguinal region with well-healing area of prior incision and drainage, still with surrounding induration, no significant redness or erythema, no significant drainage at the wound  Abscess Area of incision and drainage appears to be healing well, still with mild induration surrounding Will have patient complete course of antibiotics Monitor for recurrence, if recurrence does occur, consider referral to general surgery  Resistant hypertension Chronic, at goal in office today which is improved from last visit While patient was in office, contacted cardiology office to arrange for follow-up appointment which will be in May Continue with prescribed medication Discussed lifestyle modifications including gradual increase in weekly physical activity, working towards healthy weight loss  Prediabetes New, active Recent hemoglobin A1c reveals prediabetes at 5.7% Discussed lifestyle modifications including gradual increase in physical activity level, working towards gradual weight loss, avoidance of high glycemic index foods with handout provided Plan for repeat hemoglobin  A1c in about 6 months  Spent 30 minutes on this patient encounter, including preparation, chart review, face-to-face counseling with patient and coordination of care, and documentation of encounter   ___________________________________________ Montserrat Shek de Peru, MD, ABFM, CAQSM Primary Care and Sports Medicine [facility]

## 2020-05-23 NOTE — Assessment & Plan Note (Signed)
Area of incision and drainage appears to be healing well, still with mild induration surrounding Will have patient complete course of antibiotics Monitor for recurrence, if recurrence does occur, consider referral to general surgery

## 2020-05-23 NOTE — Patient Instructions (Signed)
  Medication Instructions:  Your physician recommends that you continue on your current medications as directed. --If you need a refill on any your medications before your next appointment, please call your pharmacy first. If no refills are authorized on file call the office.--  Follow-Up: Your next appointment:   Your physician recommends that you schedule a follow-up appointment in: 3 MONTHS with Dr. De Peru  Thanks for letting us be apart of your health journey!!  Primary Care and Sports Medicine   Dr. de Peru and Shawna Clamp, DNP, AGNP   -- You have a scheduled a follow up appointment with [facility] with Dr. Elder Negus on 07/02/20 at 10:45 am. Contact the office at [phone] and ask to speak to a medical assistant or triage nurse about the issues you're having with your Bidil. --    We recommend signing up for the patient portal called "MyChart".  Sign up information is provided on this After Visit Summary.  MyChart is used to connect with patients for Virtual Visits (Telemedicine).  Patients are able to view lab/test results, encounter notes, upcoming appointments, etc.  Non-urgent messages can be sent to your provider as well.   To learn more about what you can do with MyChart, go to [email or web address].

## 2020-05-23 NOTE — Assessment & Plan Note (Signed)
Chronic, at goal in office today which is improved from last visit While patient was in office, contacted cardiology office to arrange for follow-up appointment which will be in May Continue with prescribed medication Discussed lifestyle modifications including gradual increase in weekly physical activity, working towards healthy weight loss

## 2020-05-24 ENCOUNTER — Other Ambulatory Visit: Payer: Self-pay | Admitting: Cardiology

## 2020-05-24 DIAGNOSIS — I10 Essential (primary) hypertension: Secondary | ICD-10-CM

## 2020-05-28 ENCOUNTER — Telehealth (HOSPITAL_BASED_OUTPATIENT_CLINIC_OR_DEPARTMENT_OTHER): Payer: Self-pay

## 2020-05-28 NOTE — Telephone Encounter (Signed)
Left message for patient to inform patient that paperwork for work physical form is ready for pick up. Will leave with front desk

## 2020-06-20 ENCOUNTER — Other Ambulatory Visit: Payer: Self-pay | Admitting: Cardiology

## 2020-06-20 DIAGNOSIS — I10 Essential (primary) hypertension: Secondary | ICD-10-CM

## 2020-07-02 ENCOUNTER — Ambulatory Visit: Payer: 59 | Admitting: Cardiology

## 2020-07-04 ENCOUNTER — Other Ambulatory Visit: Payer: Self-pay

## 2020-07-04 ENCOUNTER — Ambulatory Visit: Payer: 59 | Admitting: Cardiology

## 2020-07-04 ENCOUNTER — Encounter: Payer: Self-pay | Admitting: Cardiology

## 2020-07-04 VITALS — BP 143/93 | HR 58 | Ht 72.0 in | Wt 250.0 lb

## 2020-07-04 DIAGNOSIS — G4733 Obstructive sleep apnea (adult) (pediatric): Secondary | ICD-10-CM

## 2020-07-04 DIAGNOSIS — I10 Essential (primary) hypertension: Secondary | ICD-10-CM

## 2020-07-04 MED ORDER — VERAPAMIL HCL ER 360 MG PO CP24: 360.0000 mg | ORAL_CAPSULE | Freq: Every day | ORAL | 2 refills | Status: DC

## 2020-07-04 NOTE — Progress Notes (Signed)
Patient referred by de [place], Blondell Reveal, MD for treatment resistant HTN   Subjective:   Eugene Gomez, male    DOB: 07-29-1980, 40 y.o.   MRN: [id number]   Chief Complaint  Patient presents with  . Hypertension  . Follow-up  . Medication Management     40 y/o [place] American male with resistant hypertension.  He has not tolerated amlodipine 10 mg due to leg edema, and lisinopril due to cough, spironolactone due to breast tenderness, Bidil due to nausea.  I last saw the patient in 05/2019. Blood pressure is fairly well controlled now. He is on both amlodipine 5 mg, and verapamil 240 mg daily. He does notice leg edema, while on amlodipine.   Current Outpatient Medications on File Prior to Visit  Medication Sig Dispense Refill  . carvedilol (COREG) 25 MG tablet TAKE 1 TABLET (25 MG TOTAL) BY MOUTH 2 (TWO) TIMES DAILY WITH A MEAL. 60 tablet 2  . chlorthalidone (HYGROTON) 25 MG tablet TAKE 1 TABLET BY MOUTH EVERY DAY 90 tablet 0  . fluticasone (FLONASE) 50 MCG/ACT nasal spray SPRAY 2 SPRAYS INTO EACH NOSTRIL EVERY DAY 16 g 12  . pantoprazole (PROTONIX) 40 MG tablet Take 1 tablet (40 mg total) by mouth daily. 90 tablet 3  . BIDIL 20-37.5 MG tablet TAKE 1 TABLET BY MOUTH THREE TIMES A DAY (Patient not taking: Reported on 07/04/2020) 90 tablet 3   No current facility-administered medications on file prior to visit.    Cardiovascular studies:  EKG 07/04/2020: Sinus rhythm 60 bpm Normal EKG  Renal artery duplex  12/16/2018: No evidence of renal artery occlusive disease in either renal artery. Normal intrarenal vascular perfusion is noted in both kidneys. Renal length is within normal limits for both kidneys. Simple cyst measuring 3x3cm noted.  Normal abdominal aorta flow velocities noted.  Echocardiogram 10/13/2018: Left ventricle cavity is normal in size. Moderate concentric hypertrophy of the left ventricle. Normal LV systolic function with EF 62%. Normal global wall motion.  Normal diastolic filling pattern.  Mild (Grade I) mitral regurgitation. Inadequate TR jet to estimate pulmonary artery systolic pressure. Normal right atrial pressure.  Recent labs: 05/17/2020: Glucose 70, BUN/Cr 13/1.16. EGFR >60. Na/K 142/3.3. Rest of the CMP normal H/H 14/44. MCV 80. Platelets 336 HbA1C 5.7% Chol 111, TG 103, HDL 35, LDL 55  05/18/2019: Glucose 94, BUN/Cr 8/1.14. EGFR 94. Na/K 142/3.8.  Renin 0.2 (normal) Aldosterone 11.3 (normal) Aldosterone/Renin 56.5 (elevated)   12/22/2018: Glucose 89, BUN/Cr 11/1.13. EGFR 95. Na/K 139/4.1. Rest of the CMP normal Chol 156, TG 152, HDL 45, LDL 81  07/29/2017: H/H 14/45. MCV 76. Platelets 334  Review of Systems  Cardiovascular: Positive for leg swelling. Negative for chest pain, dyspnea on exertion, palpitations and syncope.       Vitals:   07/04/20 1356 07/04/20 1400  BP: (!) 138/92 (!) 143/93  Pulse: 82 (!) 58  SpO2: 98% 98%     Objective:   Physical Exam Vitals and nursing note reviewed.  Constitutional:      General: He is not in acute distress. Neck:     Vascular: No JVD.  Cardiovascular:     Rate and Rhythm: Normal rate and regular rhythm.     Heart sounds: Normal heart sounds. No murmur heard.   Pulmonary:     Effort: Pulmonary effort is normal.     Breath sounds: Normal breath sounds. No wheezing or rales.  Musculoskeletal:     Right lower leg: Edema (Trace) present.  Left lower leg: Edema (Trace) present.         Assessment & Recommendations:   40 year old African-American male with resistant hypertension, moderate OSA, family history of hypertension.  Resistant hypertension: Likely familial. No RAS on duplex. Renin/aldosterone do not suggest hyperaldosteronism, Blood pressure is much improved.  Continue carvedilol to 25 mg bid, chlorthalidone 12.5 mg daily Increase verapamil to 360 mg daily Stopped amlodipine  OSA:  Recommend using CPAP for OSA.  F/u in 2 months   [name] Esther Hardy, MD [facility] [place] Pager: [phone] Office: [phone] If no answer Cell [phone]

## 2020-07-05 ENCOUNTER — Telehealth (HOSPITAL_BASED_OUTPATIENT_CLINIC_OR_DEPARTMENT_OTHER): Payer: Self-pay

## 2020-07-05 ENCOUNTER — Telehealth (HOSPITAL_BASED_OUTPATIENT_CLINIC_OR_DEPARTMENT_OTHER): Payer: 59 | Admitting: Family Medicine

## 2020-07-05 ENCOUNTER — Telehealth: Payer: Self-pay

## 2020-07-05 NOTE — Telephone Encounter (Signed)
Called patient he has not started verapamil do you still want him to hold it and resume amlodipine please advise

## 2020-07-05 NOTE — Telephone Encounter (Signed)
Patient is scheduled to be seen by CC 07/06/2020

## 2020-07-05 NOTE — Telephone Encounter (Signed)
Has he started the new medication verapamil at 360 mg If so, that may be causing low heart rate and blood pressure. Recommend holding verapamil for now. Resume amlodipine. Increase water intake. If symptoms do not improve, he may need to be sooner in urgent care or our office.  Thanks MJP

## 2020-07-05 NOTE — Progress Notes (Signed)
Patient referred by de [place], Blondell Reveal, MD for treatment resistant HTN   Subjective:   Eugene Gomez, male    DOB: Jul 05, 1980, 40 y.o.   MRN: [id number]   Chief Complaint  Patient presents with  . Bradycardia  . Follow-up  . Dizziness    40 y/o [place] American male with resistant hypertension.  He has not tolerated amlodipine 10 mg due to leg edema, and lisinopril due to cough, spironolactone due to breast tenderness, Bidil due to nausea.  Patient presents for urgent visit with concerns of dizziness and low heart rate.  He was seen in his PCPs office yesterday who in turn called our office directly concerned given patient's sudden onset dizziness.  Patient was seen in our office by Dr. Virgina Jock 2 days ago and amlodipine was discontinued and verapamil was increased to 360 mg daily however patient has not yet started increased dose of verapamil.  Yesterday patient had sudden onset of dizziness and gait imbalance which has persisted to today, so has been present for at least 12 hours.  Denies chest pain, palpitations, syncope, near syncope, dyspnea.  Patient has longstanding history of resistant hypertension, in the office today blood pressures elevated, relatively well controlled compared to previous.  Also heart rate is stable at 60 bpm.  Current Outpatient Medications on File Prior to Visit  Medication Sig Dispense Refill  . BIDIL 20-37.5 MG tablet TAKE 1 TABLET BY MOUTH THREE TIMES A DAY 90 tablet 3  . carvedilol (COREG) 25 MG tablet TAKE 1 TABLET (25 MG TOTAL) BY MOUTH 2 (TWO) TIMES DAILY WITH A MEAL. 60 tablet 2  . chlorthalidone (HYGROTON) 25 MG tablet TAKE 1 TABLET BY MOUTH EVERY DAY 90 tablet 0  . fluticasone (FLONASE) 50 MCG/ACT nasal spray SPRAY 2 SPRAYS INTO EACH NOSTRIL EVERY DAY 16 g 12  . pantoprazole (PROTONIX) 40 MG tablet Take 1 tablet (40 mg total) by mouth daily. 90 tablet 3  . verapamil (VERELAN PM) 360 MG 24 hr capsule Take 1 capsule (360 mg total) by mouth at  bedtime. 60 capsule 2   No current facility-administered medications on file prior to visit.    Cardiovascular studies: EKG 07/04/2020: Sinus rhythm 60 bpm Normal EKG  Renal artery duplex  12/16/2018: No evidence of renal artery occlusive disease in either renal artery. Normal intrarenal vascular perfusion is noted in both kidneys. Renal length is within normal limits for both kidneys. Simple cyst measuring 3x3cm noted.  Normal abdominal aorta flow velocities noted.  Echocardiogram 10/13/2018: Left ventricle cavity is normal in size. Moderate concentric hypertrophy of the left ventricle. Normal LV systolic function with EF 62%. Normal global wall motion. Normal diastolic filling pattern.  Mild (Grade I) mitral regurgitation. Inadequate TR jet to estimate pulmonary artery systolic pressure. Normal right atrial pressure.  Recent labs: 05/17/2020: Glucose 70, BUN/Cr 13/1.16. EGFR >60. Na/K 142/3.3. Rest of the CMP normal H/H 14/44. MCV 80. Platelets 336 HbA1C 5.7% Chol 111, TG 103, HDL 35, LDL 55  05/18/2019: Glucose 94, BUN/Cr 8/1.14. EGFR 94. Na/K 142/3.8.  Renin 0.2 (normal) Aldosterone 11.3 (normal) Aldosterone/Renin 56.5 (elevated)   12/22/2018: Glucose 89, BUN/Cr 11/1.13. EGFR 95. Na/K 139/4.1. Rest of the CMP normal Chol 156, TG 152, HDL 45, LDL 81  07/29/2017: H/H 14/45. MCV 76. Platelets 334  Review of Systems  Eyes: Negative for visual disturbance.  Cardiovascular: Negative for chest pain, dyspnea on exertion, leg swelling, palpitations and syncope.  Neurological: Positive for dizziness and loss of balance. Negative for  focal weakness and paresthesias.       Vitals:   07/06/20 1307 07/06/20 1308  BP: (!) 143/110 (!) 143/105  Pulse: 61 60  Resp:    SpO2: 96% 96%     Objective:   Physical Exam Vitals and nursing note reviewed.  Constitutional:      General: He is not in acute distress. Neck:     Vascular: No JVD.  Cardiovascular:     Rate and  Rhythm: Normal rate and regular rhythm.     Heart sounds: Normal heart sounds. No murmur heard.   Pulmonary:     Effort: Pulmonary effort is normal.     Breath sounds: Normal breath sounds. No wheezing or rales.  Musculoskeletal:     Right lower leg: Edema (Trace) present.     Left lower leg: Edema (Trace) present.  Neurological:     General: No focal deficit present.     Mental Status: He is oriented to person, place, and time.     Cranial Nerves: No cranial nerve deficit.     Sensory: No sensory deficit.     Motor: No weakness.     Comments: Nystagmus noted, gait imbalance noted during exam.         Assessment & Recommendations:   40 year old African-American male with resistant hypertension, moderate OSA, family history of hypertension.  Acute onset dizziness: Nystagmus present on exam, no other neurodeficits noted. Symptoms and exam seem more consistent with vertigo. However given patient's longstanding history of resistant hypertension, stroke cannot be ruled out.  As patient's dizziness has been present for greater than 12 hours recommend transfer to emergency department for stroke rule out with head CT.  Patient's wife will drive him the the emergency department directly from our office.   Do not suspect dizziness related to bradycardia as patient's heart rate is stable at 60 bpm in the office at this time and continues to have significant dizziness.  Follow up with Dr. Virgina Jock as previously scheduled.   Patient was seen in collaboration with Dr. Virgina Jock. He also reviewed patient's chart and examined the patient. Dr. Virgina Jock is in agreement of the plan.     Alethia Berthold, PA-C 07/06/2020, 1:37 PM Office: [phone]

## 2020-07-05 NOTE — Telephone Encounter (Signed)
Yes please. If symptoms persists, still needs to be evaluated. Please make f/u appt w/me or CC tomorrow or next week.  Thanks MJP

## 2020-07-05 NOTE — Telephone Encounter (Signed)
Patient called in stating that he was dizzy and light headed at work. Patient states he was checked by a nurse at his job where he was told his heart rate was in the 40's.  Patient was advised to follow up with his PCP Upon speaking with Dr. de [place], is was decided that its be best that he consult his cardilogist office as he was seen in office yesterday and medication changes were made Patient assessed blood pressure while on the phone with me and it was 141/93 hr 50 Patient is aware and agreeable to plan and was instructed to call 911 or report to the ED if symptoms worsen.

## 2020-07-06 ENCOUNTER — Emergency Department (HOSPITAL_COMMUNITY): Payer: 59

## 2020-07-06 ENCOUNTER — Encounter: Payer: Self-pay | Admitting: Student

## 2020-07-06 ENCOUNTER — Emergency Department (HOSPITAL_COMMUNITY)
Admission: EM | Admit: 2020-07-06 | Discharge: 2020-07-07 | Disposition: A | Discharge status: Home / Self Care | Payer: 59 | Attending: Emergency Medicine | Admitting: Emergency Medicine

## 2020-07-06 ENCOUNTER — Encounter (HOSPITAL_COMMUNITY): Payer: Self-pay

## 2020-07-06 ENCOUNTER — Other Ambulatory Visit: Payer: Self-pay

## 2020-07-06 ENCOUNTER — Ambulatory Visit: Payer: 59 | Admitting: Student

## 2020-07-06 VITALS — BP 143/105 | HR 60 | Resp 16 | Ht 72.0 in | Wt 250.0 lb

## 2020-07-06 DIAGNOSIS — Z87891 Personal history of nicotine dependence: Secondary | ICD-10-CM | POA: Insufficient documentation

## 2020-07-06 DIAGNOSIS — Z79899 Other long term (current) drug therapy: Secondary | ICD-10-CM | POA: Insufficient documentation

## 2020-07-06 DIAGNOSIS — R42 Dizziness and giddiness: Secondary | ICD-10-CM | POA: Diagnosis not present

## 2020-07-06 DIAGNOSIS — I10 Essential (primary) hypertension: Secondary | ICD-10-CM

## 2020-07-06 DIAGNOSIS — R001 Bradycardia, unspecified: Secondary | ICD-10-CM

## 2020-07-06 LAB — CBC WITH DIFFERENTIAL/PLATELET
Abs Immature Granulocytes: 0.04 10*3/uL (ref 0.00–0.07)
Basophils Absolute: 0 10*3/uL (ref 0.0–0.1)
Basophils Relative: 0 %
Eosinophils Absolute: 0 10*3/uL (ref 0.0–0.5)
Eosinophils Relative: 1 %
HCT: 46.7 % (ref 39.0–52.0)
Hemoglobin: 14.9 g/dL (ref 13.0–17.0)
Immature Granulocytes: 1 %
Lymphocytes Relative: 30 %
Lymphs Abs: 2.1 10*3/uL (ref 0.7–4.0)
MCH: 26 pg (ref 26.0–34.0)
MCHC: 31.9 g/dL (ref 30.0–36.0)
MCV: 81.5 fL (ref 80.0–100.0)
Monocytes Absolute: 0.6 10*3/uL (ref 0.1–1.0)
Monocytes Relative: 9 %
Neutro Abs: 4.2 10*3/uL (ref 1.7–7.7)
Neutrophils Relative %: 59 %
Platelets: 384 10*3/uL (ref 150–400)
RBC: 5.73 MIL/uL (ref 4.22–5.81)
RDW: 16.5 % — ABNORMAL HIGH (ref 11.5–15.5)
WBC: 7 10*3/uL (ref 4.0–10.5)
nRBC: 0 % (ref 0.0–0.2)

## 2020-07-06 LAB — BASIC METABOLIC PANEL
Anion gap: 9 (ref 5–15)
BUN: 10 mg/dL (ref 6–20)
CO2: 28 mmol/L (ref 22–32)
Calcium: 9.4 mg/dL (ref 8.9–10.3)
Chloride: 100 mmol/L (ref 98–111)
Creatinine, Ser: 1.02 mg/dL (ref 0.61–1.24)
GFR, Estimated: >60 mL/min (ref >60)
Glucose, Bld: 100 mg/dL — ABNORMAL HIGH (ref 70–99)
Potassium: 2.9 mmol/L — ABNORMAL LOW (ref 3.5–5.1)
Sodium: 137 mmol/L (ref 135–145)

## 2020-07-06 MED ORDER — MECLIZINE HCL 25 MG PO TABS
25.0000 mg | ORAL_TABLET | Freq: Once | ORAL | Status: AC
  Administered 2020-07-06: 25 mg via ORAL
  Filled 2020-07-06: qty 1

## 2020-07-06 MED ORDER — POTASSIUM CHLORIDE CRYS ER 20 MEQ PO TBCR
40.0000 meq | EXTENDED_RELEASE_TABLET | Freq: Once | ORAL | Status: AC
  Administered 2020-07-06: 40 meq via ORAL
  Filled 2020-07-06: qty 2

## 2020-07-06 MED ORDER — MECLIZINE HCL 25 MG PO TABS: 25.0000 mg | ORAL_TABLET | Freq: Three times a day (TID) | ORAL | 0 refills | Status: DC | PRN

## 2020-07-06 NOTE — ED Provider Notes (Addendum)
[facility] EMERGENCY DEPARTMENT Provider Note   CSN: [id number] Arrival date & time: 07/06/20  1337     History Chief Complaint  Patient presents with  . Dizziness    Eugene Gomez is a 40 y.o. male.  40 year old male with past medical history below including prediabetes, hypertension, OSA who presents with dizziness.  Yesterday while seated patient began having dizziness which he describes as room spinning sensation and feeling off balance.  Symptoms have been persistent throughout the day yesterday and today, he has been mostly laying in bed because of symptoms.  He had a follow-up with his cardiologist today and they noted nystagmus, sent him here for further evaluation.  He denies any lightheadedness sensation, headache, or focal weakness.  He has never had this problem before.  No recent changes to medications although he has been written for a new prescription for blood pressure management, has not yet started.  He notes that the dizziness is relatively constant and does not seem to be related to head positioning.  No associated tinnitus or hearing problems.  Family history of stroke.  No recent illness.  He denies drug or alcohol problems.  The history is provided by the patient.  Dizziness      Past Medical History:  Diagnosis Date  . Hypertension     Patient Active Problem List   Diagnosis Date Noted  . Prediabetes 05/23/2020  . Abscess 05/17/2020  . Resistant hypertension 01/05/2019  . OSA (obstructive sleep apnea) 10/12/2018  . Cough 05/25/2018  . Heartburn 11/02/2016  . Tinea versicolor 10/28/2016  . Former smoker 05/21/2016  . Allergic rhinitis 05/21/2016  . BMI 33.0-33.9,adult 07/17/2015  . Hepatitis B immune 10/26/2014  . Seborrhea capitis 08/24/2014  . Folliculitis 08/24/2014  . Excessive sweating 08/24/2014  . Acute meniscal tear of left knee 08/24/2014  . Left shoulder pain 08/24/2014  . Essential hypertension 07/02/2011    Past  Surgical History:  Procedure Laterality Date  . DENTAL SURGERY Left   . WISDOM TOOTH EXTRACTION         Family History  Problem Relation Age of Onset  . Hypertension Mother   . Anemia Mother   . Hypertension Father   . Diabetes Father   . Hypertension Brother   . Drug abuse Brother   . Cancer Sister        brain  . Hypertension Maternal Aunt   . Hypertension Maternal Uncle   . Hypertension Paternal Aunt   . Glaucoma Paternal Aunt   . Diabetes Paternal Aunt   . Hypertension Paternal Uncle   . Diabetes Paternal Uncle     Social History   Tobacco Use  . Smoking status: Former Smoker    Packs/day: 2.00    Years: 10.00    Pack years: 20.00    Types: Cigars    Quit date: 2018    Years since quitting: 4.4  . Smokeless tobacco: Never Used  . Tobacco comment: smoked black and milds 1 a day   Vaping Use  . Vaping Use: Never used  Substance Use Topics  . Alcohol use: Yes    Alcohol/week: 1.0 - 10.0 standard drink    Types: 1 - 10 Standard drinks or equivalent per week    Comment: MIXED DRINKS  . Drug use: No    Home Medications Prior to Admission medications   Medication Sig Start Date End Date Taking? Authorizing Provider  meclizine (ANTIVERT) 25 MG tablet Take 1 tablet (25 mg total) by  mouth every 8 (eight) hours as needed for dizziness. 07/06/20  Yes Dennise Bamber, Ambrose Finlandachel Morgan, MD  BIDIL 20-37.5 MG tablet TAKE 1 TABLET BY MOUTH THREE TIMES A DAY 09/26/19   Patwardhan, Manish J, MD  carvedilol (COREG) 25 MG tablet TAKE 1 TABLET (25 MG TOTAL) BY MOUTH 2 (TWO) TIMES DAILY WITH A MEAL. 02/23/20   Patwardhan, Anabel BeneManish J, MD  chlorthalidone (HYGROTON) 25 MG tablet TAKE 1 TABLET BY MOUTH EVERY DAY 06/20/20   Patwardhan, Manish J, MD  fluticasone (FLONASE) 50 MCG/ACT nasal spray SPRAY 2 SPRAYS INTO EACH NOSTRIL EVERY DAY 05/18/18   Collie SiadStallings, [name], MD  pantoprazole (PROTONIX) 40 MG tablet Take 1 tablet (40 mg total) by mouth daily. 05/17/20   de [place]uba, Buren Kosaymond J, MD  verapamil (VERELAN PM)  360 MG 24 hr capsule Take 1 capsule (360 mg total) by mouth at bedtime. 07/04/20   Patwardhan, Anabel BeneManish J, MD    Allergies    Aspirin  Review of Systems   Review of Systems  Neurological: Positive for dizziness.   All other systems reviewed and are negative except that which was mentioned in HPI  Physical Exam Updated Vital Signs BP 133/78 (BP Location: Right Arm)   Pulse 70   Temp 97.9 F (36.6 C) (Oral)   Resp 17   SpO2 97%   Physical Exam Vitals and nursing note reviewed.  Constitutional:      General: He is not in acute distress.    Appearance: He is well-developed.     Comments: Awake, alert  HENT:     Head: Normocephalic and atraumatic.     Nose: Nose normal.     Mouth/Throat:     Mouth: Mucous membranes are moist.     Pharynx: Oropharynx is clear.  Eyes:     Extraocular Movements: Extraocular movements intact.     Conjunctiva/sclera: Conjunctivae normal.     Pupils: Pupils are equal, round, and reactive to light.     Comments: A few beats of horizontal nystagmus with gaze to the right  Cardiovascular:     Rate and Rhythm: Normal rate and regular rhythm.     Heart sounds: Normal heart sounds. No murmur heard.   Pulmonary:     Effort: Pulmonary effort is normal. No respiratory distress.     Breath sounds: Normal breath sounds.  Abdominal:     General: Bowel sounds are normal. There is no distension.     Palpations: Abdomen is soft.     Tenderness: There is no abdominal tenderness.  Musculoskeletal:     Cervical back: Neck supple.  Skin:    General: Skin is warm and dry.  Neurological:     Mental Status: He is alert and oriented to person, place, and time.     Cranial Nerves: No cranial nerve deficit.     Sensory: No sensory deficit.     Motor: No weakness or abnormal muscle tone.     Deep Tendon Reflexes: Reflexes are normal and symmetric.     Comments: Fluent speech, normal finger-to-nose testing, negative pronator drift, no clonus 5/5 strength and  normal sensation x all 4 extremities Mildly ataxia when walking  Psychiatric:        Thought Content: Thought content normal.        Judgment: Judgment normal.     ED Results / Procedures / Treatments   Labs (all labs ordered are listed, but only abnormal results are displayed) Labs Reviewed  CBC WITH DIFFERENTIAL/PLATELET - Abnormal; Notable for  the following components:      Result Value   RDW 16.5 (*)    All other components within normal limits  BASIC METABOLIC PANEL - Abnormal; Notable for the following components:   Potassium 2.9 (*)    Glucose, Bld 100 (*)    All other components within normal limits    EKG EKG Interpretation  Date/Time:  Friday Jul 06 2020 13:50:24 EDT Ventricular Rate:  60 PR Interval:  184 QRS Duration: 104 QT Interval:  494 QTC Calculation: 494 R Axis:   63 Text Interpretation: Normal sinus rhythm Prolonged QT Abnormal ECG no old tracing for comparison Confirmed by Alona Bene [phone]) on 07/06/2020 2:03:17 PM   Radiology CT Head Wo Contrast  Result Date: 07/06/2020 CLINICAL DATA:  Nonspecific dizziness over the last 2 days. EXAM: CT HEAD WITHOUT CONTRAST TECHNIQUE: Contiguous axial images were obtained from the base of the skull through the vertex without intravenous contrast. COMPARISON:  None. FINDINGS: Brain: The brain shows a normal appearance without evidence of malformation, atrophy, old or acute small or large vessel infarction, mass lesion, hemorrhage, hydrocephalus or extra-axial collection. Vascular: No hyperdense vessel. No evidence of atherosclerotic calcification. Skull: Normal.  No traumatic finding.  No focal bone lesion. Sinuses/Orbits: Sinuses are clear. Orbits appear normal. Mastoids are clear. Other: None significant IMPRESSION: Normal examination. No abnormality seen to explain the clinical presentation. Electronically Signed   By: Paulina Fusi M.D.   On: 07/06/2020 15:04   MR Brain Wo Contrast (neuro protocol)  Result Date:  07/06/2020 CLINICAL DATA:  Initial evaluation for acute vertigo. EXAM: MRI HEAD WITHOUT CONTRAST TECHNIQUE: Multiplanar, multiecho pulse sequences of the brain and surrounding structures were obtained without intravenous contrast. COMPARISON:  Prior CT from earlier the same day. FINDINGS: Brain: Cerebral volume within normal limits for age. Few scattered subcentimeter foci of T2/FLAIR hyperintensity noted involving the supratentorial cerebral white matter, nonspecific, but minimal in nature and of doubtful significance. No abnormal foci of restricted diffusion to suggest acute or subacute ischemia. Gray-white matter differentiation maintained. No encephalomalacia to suggest chronic cortical infarction. No evidence for acute or chronic intracranial hemorrhage. No mass lesion, midline shift or mass effect. No hydrocephalus or extra-axial fluid collection. Pituitary gland suprasellar region normal. Midline structures intact. Vascular: Major intracranial vascular flow voids are maintained. Skull and upper cervical spine: Craniocervical junction normal. Bone marrow signal intensity within normal limits. No scalp soft tissue abnormality. Sinuses/Orbits: Globes orbital soft tissues within normal limits. Right maxillary sinus retention cyst noted. Minimal mucosal thickening about the left maxillary sinus. No significant mastoid effusion. Inner ear structures grossly normal. Other: None. IMPRESSION: Normal brain MRI for age. No acute intracranial abnormality identified. Electronically Signed   By: Rise Mu M.D.   On: 07/06/2020 23:40    Procedures Procedures   Medications Ordered in ED Medications  meclizine (ANTIVERT) tablet 25 mg (25 mg Oral Given 07/06/20 2241)  potassium chloride SA (KLOR-CON) CR tablet 40 mEq (40 mEq Oral Given 07/06/20 2327)    ED Course  I have reviewed the triage vital signs and the nursing notes.  Pertinent labs & imaging results that were available during my care of the  patient were reviewed by me and considered in my medical decision making (see chart for details).    MDM Rules/Calculators/A&P                          Alert, well appearing on exam, normal VS. his neurologic exam was  notable only for some mild ataxia when trying to ambulate.  He was able to ambulate independently but did appear to have some balance problems during ambulation.  In the constant nature of his symptoms, his story is not classic for peripheral vertigo such as BPPV. Because of his ataxia and constant sx, I recommended MRI to r/o intracranial process such as stroke or mass.   MRI negative acute. PT comfortable on reassessment. I discussed abortive measures for vertigo symptoms including Epley maneuver and meclizine as needed and instructed to follow-up with PCP.  I extensively reviewed return precautions with patient and his wife and they voiced understanding. Final Clinical Impression(s) / ED Diagnoses Final diagnoses:  Vertigo    Rx / DC Orders ED Discharge Orders         Ordered    meclizine (ANTIVERT) 25 MG tablet  Every 8 hours PRN        07/06/20 2329           Brailyn Killion, Ambrose Finland, MD 07/06/20 2328    Clarene Duke, Ambrose Finland, MD 07/07/20 0003

## 2020-07-06 NOTE — ED Notes (Signed)
Pt transported to MRI 

## 2020-07-06 NOTE — ED Triage Notes (Signed)
Pt states he started having dizziness x2 days. Pt denies any cp,sob. Pt neuro intact.

## 2020-07-06 NOTE — ED Provider Notes (Signed)
Emergency Medicine Provider Triage Evaluation Note  Eugene Gomez , a 40 y.o. male  was evaluated in triage.  Pt complains of sudden onset dizziness which started yesterday.  Initially seen by PCP who subsequently had patient follow-up with [facility] cardiology today.  They noted nystagmus on exam.  He had no other focal neurologic deficits.  Does have history of treatment resistant hypertension.  Sent him here for head CT.  Cardiology did not feel patient's lightheadedness was due to his bradycardia.  Patient denies any headache, syncope, paresthesias or weakness.  No history of prior aneurysms.  Denies any sudden onset thunderclap headache  Review of Systems  Positive: Dizziness Negative: Headache, syncope, chest pain, shortness of  Physical Exam  There were no vitals taken for this visit. Gen:   Awake, no distress   Resp:  Normal effort  MSK:   Moves extremities without difficulty  Neuro:  Cranial nerves II to XII grossly intact, nystagmus present, equal handgrip bilaterally, intact sensation Other:    Medical Decision Making  Medically screening exam initiated at 1:40 PM.  Appropriate orders placed.  Brayton Baumgartner was informed that the remainder of the evaluation will be completed by another provider, this initial triage assessment does not replace that evaluation, and the importance of remaining in the ED until their evaluation is complete.  Dizziness  Patient stable.  Given timing of symptoms not a code stroke.   Justn Quale A, PA-C 07/06/20 1354    Derwood Kaplan, MD 07/06/20 1709

## 2020-07-07 NOTE — ED Notes (Signed)
E-signature pad unavailable at time of pt discharge. This RN discussed discharge materials with pt and answered all pt questions. Pt stated understanding of discharge material. ? ?

## 2020-07-11 DIAGNOSIS — H9311 Tinnitus, right ear: Secondary | ICD-10-CM

## 2020-07-12 ENCOUNTER — Ambulatory Visit (HOSPITAL_BASED_OUTPATIENT_CLINIC_OR_DEPARTMENT_OTHER): Payer: 59 | Admitting: Family Medicine

## 2020-07-12 ENCOUNTER — Encounter (HOSPITAL_BASED_OUTPATIENT_CLINIC_OR_DEPARTMENT_OTHER): Payer: Self-pay

## 2020-07-12 ENCOUNTER — Other Ambulatory Visit: Payer: Self-pay

## 2020-07-12 DIAGNOSIS — H9311 Tinnitus, right ear: Secondary | ICD-10-CM

## 2020-07-12 DIAGNOSIS — R42 Dizziness and giddiness: Secondary | ICD-10-CM | POA: Diagnosis not present

## 2020-07-12 MED ORDER — MECLIZINE HCL 25 MG PO TABS: 25.0000 mg | ORAL_TABLET | Freq: Three times a day (TID) | ORAL | 0 refills | Status: DC | PRN

## 2020-07-12 NOTE — Assessment & Plan Note (Signed)
Acute onset, improving, not at goal -uncertain etiology, imaging completed in ED to rule out intracranial pathology such as stroke and evaluation with cardiology showed normal heart rate and blood pressure in setting of symptoms and thus bradycardia and hypotension do not appear to be strong contributors, Mnire's disease within differential given associated tinnitus Previous evaluation with cardiology and emergency department Has been taken off of BiDil, verapamil dosage increased In ED, MRI and CT scan were both negative for acute intracranial pathology which could explain symptoms Has had some improvement with use of meclizine, performing Epley maneuvers at home Discussed options with patient including continued monitoring, referral to PT for vestibular rehab Patient wishes to proceed with monitoring, as needed use of meclizine, hold off on physical therapy

## 2020-07-12 NOTE — Patient Instructions (Signed)
  Medication Instructions:  Your physician recommends that you continue on your current medications as directed. Please refer to the Current Medication list given to you today. --If you need a refill on any your medications before your next appointment, please call your pharmacy first. If no refills are authorized on file call the office.--  Referrals/Procedures/Imaging: A referral has been placed for you to Audiology for evaluation and treatment. Someone from the scheduling department will be in contact with you in regards to coordinating your consultation. If you do not hear from any of the schedulers within 7-10 business days please give our office a call.  Follow-Up: Your next appointment:   Your physician recommends that keep your scheduled follow up in July with Dr. de [place]  Thanks for letting us be apart of your health journey!!  Primary Care and Sports Medicine   Dr. de Peru and Shawna Clamp, DNP, AGNP  We recommend signing up for the patient portal called "MyChart".  Sign up information is provided on this After Visit Summary.  MyChart is used to connect with patients for Virtual Visits (Telemedicine).  Patients are able to view lab/test results, encounter notes, upcoming appointments, etc.  Non-urgent messages can be sent to your provider as well.   To learn more about what you can do with MyChart, please visit --  [email or web address].

## 2020-07-12 NOTE — Assessment & Plan Note (Signed)
Acute onset, continues to be problematic Given association with vertigo symptoms, Mnire disease is within differential Will refer to audiology to complete audiometric evaluation

## 2020-07-12 NOTE — Progress Notes (Signed)
    Procedures performed today:    None.  Independent interpretation of notes and tests performed by another provider:   None.  Brief History, Exam, Impression, and Recommendations:    Larue is a 40 year old male presenting for evaluation of dizziness and tinnitus.  Symptoms began about 1 week ago.  Reports issues with dizziness, balance, gait stability.  Symptoms started while at work, saw nurse at work who noted that he had normal blood pressure but was bradycardic with heart rate in the 40s.  Patient was evaluated by cardiology the next day and was found to have normal blood pressure with heart rate in the 60s and that he continue to have symptoms of vertigo.  Given this did not feel it was related to his blood pressure and heart rate and referred him to the emergency department for stroke rule out.  Cardiology also stopped his BiDil and increased his verapamil dose to 360 mg.  In the emergency department, patient had MRI and CT scan completed which did not show any acute intracranial pathology to explain his symptoms.  Patient was prescribed meclizine and discharged home.  Since then, he indicates that his symptoms have gradually been improving with dizziness mostly better, improved gait.  He has been taking the meclizine as needed as well as performing Epley maneuver for which she was given handout for.  Still with some ringing in his right ear, was worse about 1 week ago.  Denies any issues with numbness, tingling, weakness.  Denies any prior issues with tenderness or vertigo in the past.  BP 124/88   Pulse 61   Ht 6' (1.829 m)   Wt 250 lb 6.4 oz (113.6 kg)   SpO2 99%   BMI 33.96 kg/m   Cardiovascular exam with regular rate and rhythm, no murmurs appreciated Extraocular movements intact, pupils round equal and reactive to light. Head impulse test with normal findings.  Dizziness Acute onset, improving, not at goal -uncertain etiology, imaging completed in ED to rule out intracranial  pathology such as stroke and evaluation with cardiology showed normal heart rate and blood pressure in setting of symptoms and thus bradycardia and hypotension do not appear to be strong contributors, Mnire's disease within differential given associated tinnitus Previous evaluation with cardiology and emergency department Has been taken off of BiDil, verapamil dosage increased In ED, MRI and CT scan were both negative for acute intracranial pathology which could explain symptoms Has had some improvement with use of meclizine, performing Epley maneuvers at home Discussed options with patient including continued monitoring, referral to PT for vestibular rehab Patient wishes to proceed with monitoring, as needed use of meclizine, hold off on physical therapy  Tinnitus of right ear Acute onset, continues to be problematic Given association with vertigo symptoms, Mnire disease is within differential Will refer to audiology to complete audiometric evaluation  Spent 30 minutes on this patient encounter, including preparation, chart review, face-to-face counseling with patient and coordination of care, and documentation of encounter   ___________________________________________ Noa Constante de Peru, MD, ABFM, CAQSM Primary Care and Sports Medicine [facility]

## 2020-07-16 NOTE — Telephone Encounter (Signed)
error 

## 2020-08-01 ENCOUNTER — Telehealth (HOSPITAL_BASED_OUTPATIENT_CLINIC_OR_DEPARTMENT_OTHER): Payer: Self-pay | Admitting: Family Medicine

## 2020-08-01 NOTE — Telephone Encounter (Signed)
Pt came into the office on 6/10 and droppe doff FMLA paperwork and paid $25 filling fee and a $25 bill he received. Payment and paperwork taken by [facility]. Paperwork is left in providers red dot tray. Please advise.

## 2020-08-01 NOTE — Telephone Encounter (Signed)
Paperwork placed in [employer] folder and given to Dr. Tommi Rumps Peru to complete

## 2020-08-18 ENCOUNTER — Other Ambulatory Visit: Payer: Self-pay | Admitting: Cardiology

## 2020-08-18 DIAGNOSIS — I10 Essential (primary) hypertension: Secondary | ICD-10-CM

## 2020-08-22 ENCOUNTER — Ambulatory Visit: Payer: 59 | Attending: Family Medicine | Admitting: Audiologist

## 2020-08-22 ENCOUNTER — Encounter (HOSPITAL_BASED_OUTPATIENT_CLINIC_OR_DEPARTMENT_OTHER): Payer: Self-pay | Admitting: Family Medicine

## 2020-08-22 ENCOUNTER — Other Ambulatory Visit: Payer: Self-pay

## 2020-08-22 ENCOUNTER — Ambulatory Visit (HOSPITAL_BASED_OUTPATIENT_CLINIC_OR_DEPARTMENT_OTHER): Payer: 59 | Admitting: Family Medicine

## 2020-08-22 VITALS — BP 126/86 | HR 62 | Ht 72.0 in | Wt 247.0 lb

## 2020-08-22 DIAGNOSIS — H9311 Tinnitus, right ear: Secondary | ICD-10-CM | POA: Diagnosis not present

## 2020-08-22 DIAGNOSIS — I10 Essential (primary) hypertension: Secondary | ICD-10-CM | POA: Diagnosis not present

## 2020-08-22 DIAGNOSIS — R42 Dizziness and giddiness: Secondary | ICD-10-CM

## 2020-08-22 DIAGNOSIS — H9193 Unspecified hearing loss, bilateral: Secondary | ICD-10-CM | POA: Insufficient documentation

## 2020-08-22 NOTE — Patient Instructions (Signed)
  Medication Instructions:  Your physician recommends that you continue on your current medications as directed. Please refer to the Current Medication list given to you today. --If you need a refill on any your medications before your next appointment, please call your pharmacy first. If no refills are authorized on file call the office.-- Follow-Up: Your next appointment:   Your physician recommends that you schedule a follow-up appointment in: 3-4 MONTHS with Dr. de Cuba  Thanks for letting us be apart of your health journey!!  Primary Care and Sports Medicine   Dr. Raymond de Cuba   We encourage you to activate your patient portal called "MyChart".  Sign up information is provided on this After Visit Summary.  MyChart is used to connect with patients for Virtual Visits (Telemedicine).  Patients are able to view lab/test results, encounter notes, upcoming appointments, etc.  Non-urgent messages can be sent to your provider as well. To learn more about what you can do with MyChart, please visit --  [email or web address]    

## 2020-08-22 NOTE — Assessment & Plan Note (Signed)
Reports that tenderness has largely stopped Does have appointment with audiology today Encouraged to proceed with appointment today for audiometric evaluation

## 2020-08-22 NOTE — Assessment & Plan Note (Signed)
Has not been checking blood pressure at home Has been making lifestyle modifications including exercising more often as well as making changes to his diet.  Patient is now attending [employer] with his wife and reports working out about 4-5 times per week.  Initially, patient found somewhat difficult to stick with the regimen, however reports that he feels he is now adapting more to the exercise as well as going to the gym becoming more of a habit Denies any issues with chest pain or headaches Blood pressure better controlled in office today Continue with current medication Has follow-up with cardiology in about 2 weeks

## 2020-08-22 NOTE — Procedures (Signed)
  Outpatient Audiology and [facility] [address] [place], [place]  [address] [phone]  AUDIOLOGICAL  EVALUATION  NAME: Eugene Gomez     DOB:   03/05/1980      MRN: [id number]                                                                                     DATE: 08/22/2020     REFERENT: de [place], Raymond J, MD STATUS: Outpatient DIAGNOSIS: Vertigo    History: Eugene Gomez was seen for an audiological evaluation. Eugene Gomez is receiving a hearing evaluation due to concerns for a sudden episode of vertigo and tinnitus . Eugene Gomez had a severe episode of vertigo that onset suddenly right before memorial day weekend. He first felt hot and strange then could not stand.  In his right ear he heard a ringing and sound like the ocean. His right ear also felt stopped up. He fell against a wall and his coworkers had to help him stand. The vertigo lasted all weekend. He now still gets dizzy if he stands up too quickly. The tinnitus has subsided.  He does not notice any changes in hearing now or during the vertigo episode. No other relevant case history reported.    Evaluation:  Otoscopy showed a clear view of the tympanic membranes, bilaterally Tympanometry results were consistent with normal middle ear function Audiometric testing was completed using conventional audiometry with insert transducer. Speech Recognition Thresholds were consistent with pure tone averages. Word Recognition was good at  an conversational level. Pure tone thresholds show normal hearing in both ears. Test results are consistent with normal hearing and good speech understanding.   Results:  The test results were reviewed with [facility]. He needs a vestibular test called a VNG. This will provide more information about his his vertigo. Also recommend he see an ENT Physician. Eugene Gomez has excellent hearing in both ears. There has not been any permanent damage to his hearing.   Recommendations: Referral to ENT Physician  necessary due to episode of vertigo lasting days.  Referral for VNG with audiologist. de Peru, [name], MD please send a referral to Dr. Suszanne Conners for medical evaluation and VNG.Ammie Ferrier  Audiologist, Au.D., CCC-A 08/22/2020  2:26 PM  Cc: de Peru, Raymond J, MD

## 2020-08-22 NOTE — Assessment & Plan Note (Addendum)
Still experiences some dizziness/feelings of being off balance Has noticed this occasionally with exercise when doing agility/increased movement Generally does feel as though symptoms have improved since last visit Discussed options with patient including vestibular rehab, patient wishes to continue with conservative measures given improvement since last visit which is reasonable Does have appointment with audiology today

## 2020-08-22 NOTE — Progress Notes (Signed)
    Procedures performed today:    None.  Independent interpretation of notes and tests performed by another provider:   None.  Brief History, Exam, Impression, and Recommendations:    BP 126/86   Pulse 62   Ht 6' (1.829 m)   Wt 247 lb (112 kg)   SpO2 96%   BMI 33.50 kg/m   Essential hypertension Has not been checking blood pressure at home Has been making lifestyle modifications including exercising more often as well as making changes to his diet.  Patient is now attending [employer] with his wife and reports working out about 4-5 times per week.  Initially, patient found somewhat difficult to stick with the regimen, however reports that he feels he is now adapting more to the exercise as well as going to the gym becoming more of a habit Denies any issues with chest pain or headaches Blood pressure better controlled in office today Continue with current medication Has follow-up with cardiology in about 2 weeks  Dizziness Still experiences some dizziness/feelings of being off balance Has noticed this occasionally with exercise when doing agility/increased movement Generally does feel as though symptoms have improved since last visit Discussed options with patient including vestibular rehab, patient wishes to continue with conservative measures given improvement since last visit which is reasonable Does have appointment with audiology today  Tinnitus of right ear Reports that tenderness has largely stopped Does have appointment with audiology today Encouraged to proceed with appointment today for audiometric evaluation  Plan for follow-up in about 3 to 4 months or sooner as needed   ___________________________________________ Emad Brechtel de [place], MD, ABFM, CAQSM Primary Care and Sports Medicine [facility]

## 2020-09-04 ENCOUNTER — Encounter (HOSPITAL_BASED_OUTPATIENT_CLINIC_OR_DEPARTMENT_OTHER): Payer: Self-pay | Admitting: Family Medicine

## 2020-09-04 NOTE — Progress Notes (Signed)
Per recommendation of Audiologist patient referred to Dr. Suszanne Conners for evaluation of vertigo. Referral placed

## 2020-09-04 NOTE — Addendum Note (Signed)
Addended by: Dareen Piano on: 09/04/2020 10:11 AM   Modules accepted: Orders

## 2020-09-05 ENCOUNTER — Other Ambulatory Visit: Payer: Self-pay

## 2020-09-05 ENCOUNTER — Encounter: Payer: Self-pay | Admitting: Cardiology

## 2020-09-05 ENCOUNTER — Ambulatory Visit: Payer: 59 | Admitting: Cardiology

## 2020-09-05 VITALS — BP 123/94 | HR 54 | Temp 97.8°F | Resp 16 | Ht 72.0 in | Wt 241.0 lb

## 2020-09-05 DIAGNOSIS — I10 Essential (primary) hypertension: Secondary | ICD-10-CM

## 2020-09-05 DIAGNOSIS — E876 Hypokalemia: Secondary | ICD-10-CM | POA: Insufficient documentation

## 2020-09-05 MED ORDER — POTASSIUM CHLORIDE CRYS ER 20 MEQ PO TBCR: 40.0000 meq | EXTENDED_RELEASE_TABLET | Freq: Every day | ORAL | 3 refills | Status: DC

## 2020-09-05 MED ORDER — CARVEDILOL 25 MG PO TABS: 25.0000 mg | ORAL_TABLET | Freq: Two times a day (BID) | ORAL | 3 refills | Status: DC

## 2020-09-05 MED ORDER — VERAPAMIL HCL ER 360 MG PO CP24: 360.0000 mg | ORAL_CAPSULE | Freq: Every day | ORAL | 3 refills | Status: DC

## 2020-09-05 MED ORDER — CHLORTHALIDONE 25 MG PO TABS
25.0000 mg | ORAL_TABLET | Freq: Every day | ORAL | 3 refills | Status: DC
Start: 2020-09-05 — End: 2021-09-30

## 2020-09-05 NOTE — Telephone Encounter (Signed)
From pt

## 2020-09-05 NOTE — Progress Notes (Signed)
  Patient referred by de Cuba, Raymond J, MD for treatment resistant HTN   Subjective:   Eugene Gomez, male    DOB: 05/08/1980, 39 y.o.   MRN: [id number]   Chief Complaint  Patient presents with   Hypertension   Follow-up    2 month    39 y/o African American male with resistant hypertension.  He has not tolerated amlodipine 10 mg due to leg edema, and lisinopril due to cough, spironolactone due to breast tenderness, Bidil due to nausea.  At last visit, patient had acute onset of dizziness symptoms.  I therefore obtained stat CT scan in the emergency room, that did not show any stroke.  His symptoms have improved with use of meclizine.  Blood pressure is well controlled.  Reviewed recent labs.  Potassium remains low, but previous work-up for hyperaldosteronism was negative.  Current Outpatient Medications on File Prior to Visit  Medication Sig Dispense Refill   carvedilol (COREG) 25 MG tablet TAKE 1 TABLET (25 MG TOTAL) BY MOUTH 2 (TWO) TIMES DAILY WITH A MEAL. 60 tablet 2   chlorthalidone (HYGROTON) 25 MG tablet TAKE 1 TABLET BY MOUTH EVERY DAY 90 tablet 0   fluticasone (FLONASE) 50 MCG/ACT nasal spray SPRAY 2 SPRAYS INTO EACH NOSTRIL EVERY DAY 16 g 12   meclizine (ANTIVERT) 25 MG tablet Take 1 tablet (25 mg total) by mouth every 8 (eight) hours as needed for dizziness. 21 tablet 0   Multiple Vitamin (MULTIVITAMIN) tablet Take 1 tablet by mouth daily.     pantoprazole (PROTONIX) 40 MG tablet Take 1 tablet (40 mg total) by mouth daily. 90 tablet 3   potassium chloride (MICRO-K) 10 MEQ CR capsule Take 10 mEq by mouth 2 (two) times daily.     verapamil (VERELAN PM) 360 MG 24 hr capsule Take 1 capsule (360 mg total) by mouth at bedtime. 60 capsule 2   Vitamin D, Ergocalciferol, (DRISDOL) 1.25 MG (50000 UNIT) CAPS capsule Take 50,000 Units by mouth once a week.     No current facility-administered medications on file prior to visit.     Cardiovascular studies:  EKG  07/04/2020: Sinus rhythm 60 bpm Normal EKG  Renal artery duplex  12/16/2018: No evidence of renal artery occlusive disease in either renal artery. Normal intrarenal vascular perfusion is noted in both kidneys. Renal length is within normal limits for both kidneys. Simple cyst measuring 3x3cm noted.  Normal abdominal aorta flow velocities noted.   Echocardiogram 10/13/2018: Left ventricle cavity is normal in size. Moderate concentric hypertrophy of the left ventricle. Normal LV systolic function with EF 62%. Normal global wall motion. Normal diastolic filling pattern.  Mild (Grade I) mitral regurgitation. Inadequate TR jet to estimate pulmonary artery systolic pressure. Normal right atrial pressure.    Recent labs: 08/22/2020: Glucose 84, BUN/Cr 17/1.14. EGFR 84. Na/K 141/3.0. Rest of the CMP normal Chol 161, TG 145, HDL 43, LDL 92  05/17/2020: Glucose 70, BUN/Cr 13/1.16. EGFR >60. Na/K 142/3.3. Rest of the CMP normal H/H 14/44. MCV 80. Platelets 336 HbA1C 5.7% Chol 111, TG 103, HDL 35, LDL 55  05/18/2019: Glucose 94, BUN/Cr 8/1.14. EGFR 94. Na/K 142/3.8.  Renin 0.2 (normal) Aldosterone 11.3 (normal) Aldosterone/Renin 56.5 (elevated)   12/22/2018: Glucose 89, BUN/Cr 11/1.13. EGFR 95. Na/K 139/4.1. Rest of the CMP normal Chol 156, TG 152, HDL 45, LDL 81  07/29/2017: H/H 14/45. MCV 76. Platelets 334  Review of Systems  Eyes:  Negative for visual disturbance.  Cardiovascular:  Negative for chest pain,   dyspnea on exertion, leg swelling, palpitations and syncope.  Neurological:  Positive for dizziness and loss of balance. Negative for focal weakness and paresthesias.      Vitals:   09/05/20 1142 09/05/20 1143  BP: (!) 144/96 (!) 123/94  Pulse: 60 (!) 54  Resp: 16   Temp: 97.8 F (36.6 C)   SpO2: 95%      Objective:   Physical Exam Vitals and nursing note reviewed.  Constitutional:      General: He is not in acute distress. Neck:     Vascular: No JVD.  Cardiovascular:      Rate and Rhythm: Normal rate and regular rhythm.     Heart sounds: Normal heart sounds. No murmur heard. Pulmonary:     Effort: Pulmonary effort is normal.     Breath sounds: Normal breath sounds. No wheezing or rales.  Musculoskeletal:     Right lower leg: No edema.     Left lower leg: No edema.        Assessment & Recommendations:   40 y/o [place] American male with resistant hypertension.  Hypertension: Controlled on current antihypertensive therapy.  K remains low, but previous workup for hyperaldosteronism has been unremarkable. Can repeat it again in future, should hypertension be uncontrolled.  Refilled carvedilol. Chlorthalidone, verapamil, Kdur  F/u in 1 year    [employer], PA-C 09/05/2020, 11:48 AM Office: [phone]

## 2020-11-22 ENCOUNTER — Encounter (HOSPITAL_BASED_OUTPATIENT_CLINIC_OR_DEPARTMENT_OTHER): Payer: Self-pay | Admitting: Family Medicine

## 2020-11-22 ENCOUNTER — Other Ambulatory Visit: Payer: Self-pay

## 2020-11-22 ENCOUNTER — Ambulatory Visit (HOSPITAL_BASED_OUTPATIENT_CLINIC_OR_DEPARTMENT_OTHER): Payer: 59 | Admitting: Family Medicine

## 2020-11-22 VITALS — BP 124/80 | HR 61 | Ht 72.0 in | Wt 238.0 lb

## 2020-11-22 DIAGNOSIS — L219 Seborrheic dermatitis, unspecified: Secondary | ICD-10-CM | POA: Diagnosis not present

## 2020-11-22 DIAGNOSIS — I10 Essential (primary) hypertension: Secondary | ICD-10-CM | POA: Diagnosis not present

## 2020-11-22 NOTE — Patient Instructions (Signed)
°  Medication Instructions:  °Your physician recommends that you continue on your current medications as directed. Please refer to the Current Medication list given to you today. °--If you need a refill on any your medications before your next appointment, please call your pharmacy first. If no refills are authorized on file call the office.-- °Follow-Up: °Your next appointment:   °Your physician recommends that you schedule a follow-up appointment in: 3-4 MONTHS with Dr. de Cuba ° °You will receive a text message or e-mail with a link to a survey about your care and experience with us today! We would greatly appreciate your feedback!  ° °Thanks for letting us be apart of your health journey!!  °Primary Care and Sports Medicine  ° °Dr. Raymond de Cuba  ° °We encourage you to activate your patient portal called "MyChart".  Sign up information is provided on this After Visit Summary.  MyChart is used to connect with patients for Virtual Visits (Telemedicine).  Patients are able to view lab/test results, encounter notes, upcoming appointments, etc.  Non-urgent messages can be sent to your provider as well. To learn more about what you can do with MyChart, please visit --  [email or web address]   ° °

## 2020-11-22 NOTE — Progress Notes (Signed)
    Procedures performed today:    None.  Independent interpretation of notes and tests performed by another provider:   None.  Brief History, Exam, Impression, and Recommendations:    BP 124/80   Pulse 61   Ht 6' (1.829 m)   Wt 238 lb (108 kg)   SpO2 95%   BMI 32.28 kg/m   Resistant hypertension Blood pressure better controlled in office today Patient continues on carvedilol, chlorthalidone, verapamil Denies any issues with chest pain or headaches Continues to follow-up with cardiology Has had some weight loss due to lifestyle modifications including adjusting diet, increasing physical activity Recommend continuing with current medication regimen, continue with lifestyle modifications, focusing on DASH diet, working towards healthy, gradual weight loss  Seborrheic dermatitis Reports having issues with scaling in his scalp Also has some symptoms over his face, this has responded to topical treatment prescribed by prior dermatologist Current treatment utilizing for scalp does not seem to be as beneficial He has tried various shampoos and topical treatments for this without significant relief On exam, has some scaling, flaking to scalp, appears most consistent with seborrheic dermatitis, less likely tinea capitis Will refer to dermatology for further evaluation given failure with various topicals  Patient was indicates that he is been working with alternative medicine provider who has done various lines including checking testosterone level and was started on testosterone by this provider.  Will request records  Plan for follow-up in about 4 months to monitor blood pressure or sooner as needed   ___________________________________________ Krosby Ritchie de Peru, MD, ABFM, CAQSM Primary Care and Sports Medicine [facility]

## 2020-11-22 NOTE — Assessment & Plan Note (Signed)
Blood pressure better controlled in office today Patient continues on carvedilol, chlorthalidone, verapamil Denies any issues with chest pain or headaches Continues to follow-up with cardiology Has had some weight loss due to lifestyle modifications including adjusting diet, increasing physical activity Recommend continuing with current medication regimen, continue with lifestyle modifications, focusing on DASH diet, working towards healthy, gradual weight loss

## 2020-11-22 NOTE — Assessment & Plan Note (Signed)
Reports having issues with scaling in his scalp Also has some symptoms over his face, this has responded to topical treatment prescribed by prior dermatologist Current treatment utilizing for scalp does not seem to be as beneficial He has tried various shampoos and topical treatments for this without significant relief On exam, has some scaling, flaking to scalp, appears most consistent with seborrheic dermatitis, less likely tinea capitis Will refer to dermatology for further evaluation given failure with various topicals

## 2021-03-25 ENCOUNTER — Ambulatory Visit (HOSPITAL_BASED_OUTPATIENT_CLINIC_OR_DEPARTMENT_OTHER): Payer: 59 | Admitting: Family Medicine

## 2021-03-27 ENCOUNTER — Other Ambulatory Visit: Payer: Self-pay

## 2021-03-27 ENCOUNTER — Encounter (HOSPITAL_BASED_OUTPATIENT_CLINIC_OR_DEPARTMENT_OTHER): Payer: Self-pay | Admitting: Family Medicine

## 2021-03-27 ENCOUNTER — Ambulatory Visit (HOSPITAL_BASED_OUTPATIENT_CLINIC_OR_DEPARTMENT_OTHER): Payer: 59 | Admitting: Family Medicine

## 2021-03-27 VITALS — BP 140/100 | HR 58 | Ht 72.0 in | Wt 232.0 lb

## 2021-03-27 DIAGNOSIS — M25512 Pain in left shoulder: Secondary | ICD-10-CM | POA: Diagnosis not present

## 2021-03-27 DIAGNOSIS — Z0000 Encounter for general adult medical examination without abnormal findings: Secondary | ICD-10-CM

## 2021-03-27 DIAGNOSIS — I10 Essential (primary) hypertension: Secondary | ICD-10-CM

## 2021-03-27 NOTE — Assessment & Plan Note (Signed)
Reports that about 2 to 3 weeks ago patient was working out and developed pain in his left shoulder.  He was doing a decline bench press at the time.  Pain is primarily over anterior shoulder.  Describes symptoms as similar to a "muscle strain".  Denies any numbness or tingling.  Reports normal range of motion, will have some pain with abduction.  Denies any loss of strength. On exam, normal active and passive range of motion.  No tenderness to palpation about the shoulder.  Normal special testing including empty can, Hawkins, Neer's.  Distal neurovascular exam intact. Suspect symptoms likely related to muscle strain, less likely related to rotator cuff etiology, do not suspect any specific joint abnormality Discussed monitoring at this time, can continue with conservative measures Recommend activity as tolerated If symptoms not improving over the next 2 to 4 weeks, consider further evaluation with x-ray imaging, physical therapy

## 2021-03-27 NOTE — Patient Instructions (Addendum)
°  Medication Instructions:  °Your physician recommends that you continue on your current medications as directed. Please refer to the Current Medication list given to you today. °--If you need a refill on any your medications before your next appointment, please call your pharmacy first. If no refills are authorized on file call the office.-- °Follow-Up: °Your next appointment:   °Your physician recommends that you schedule a follow-up appointment in: 1-2 MONTHS for CPE with Dr. de Cuba ° °You will receive a text message or e-mail with a link to a survey about your care and experience with us today! We would greatly appreciate your feedback!  ° °Thanks for letting us be apart of your health journey!!  °Primary Care and Sports Medicine  ° °Dr. Raymond de Cuba  ° °We encourage you to activate your patient portal called "MyChart".  Sign up information is provided on this After Visit Summary.  MyChart is used to connect with patients for Virtual Visits (Telemedicine).  Patients are able to view lab/test results, encounter notes, upcoming appointments, etc.  Non-urgent messages can be sent to your provider as well. To learn more about what you can do with MyChart, please visit --  [email or web address]   ° °

## 2021-03-27 NOTE — Progress Notes (Signed)
° ° °  Procedures performed today:    None.  Independent interpretation of notes and tests performed by another provider:   None.  Brief History, Exam, Impression, and Recommendations:    BP (!) 140/100    Pulse (!) 58    Ht 6' (1.829 m)    Wt 232 lb (105.2 kg)    SpO2 98%    BMI 31.46 kg/m   Left shoulder pain Reports that about 2 to 3 weeks ago patient was working out and developed pain in his left shoulder.  He was doing a decline bench press at the time.  Pain is primarily over anterior shoulder.  Describes symptoms as similar to a "muscle strain".  Denies any numbness or tingling.  Reports normal range of motion, will have some pain with abduction.  Denies any loss of strength. On exam, normal active and passive range of motion.  No tenderness to palpation about the shoulder.  Normal special testing including empty can, Hawkins, Neer's.  Distal neurovascular exam intact. Suspect symptoms likely related to muscle strain, less likely related to rotator cuff etiology, do not suspect any specific joint abnormality Discussed monitoring at this time, can continue with conservative measures Recommend activity as tolerated If symptoms not improving over the next 2 to 4 weeks, consider further evaluation with x-ray imaging, physical therapy  Resistant hypertension Blood pressure above goal in office today.  Has been better controlled at prior visits.  Continues with chlorthalidone, verapamil, carvedilol.  Denies any current issues with chest pain, headaches, lightheadedness or dizziness At this time, will continue with current medication regimen Recommend intermittent monitoring at home, has not been checking as much recently Recommend DASH diet, regular aerobic exercise  Plan for follow-up in about 2 months for CPE, nurse visit for labs 1 week prior   ___________________________________________ Malayah Demuro de [place], MD, ABFM, CAQSM Primary Care and Sports Medicine [facility]

## 2021-03-27 NOTE — Assessment & Plan Note (Signed)
Blood pressure above goal in office today.  Has been better controlled at prior visits.  Continues with chlorthalidone, verapamil, carvedilol.  Denies any current issues with chest pain, headaches, lightheadedness or dizziness At this time, will continue with current medication regimen Recommend intermittent monitoring at home, has not been checking as much recently Recommend DASH diet, regular aerobic exercise

## 2021-04-26 ENCOUNTER — Other Ambulatory Visit (HOSPITAL_BASED_OUTPATIENT_CLINIC_OR_DEPARTMENT_OTHER): Payer: Self-pay | Admitting: Family Medicine

## 2021-04-26 DIAGNOSIS — R12 Heartburn: Secondary | ICD-10-CM

## 2021-05-29 ENCOUNTER — Other Ambulatory Visit (HOSPITAL_BASED_OUTPATIENT_CLINIC_OR_DEPARTMENT_OTHER): Payer: Self-pay

## 2021-05-29 ENCOUNTER — Ambulatory Visit (HOSPITAL_BASED_OUTPATIENT_CLINIC_OR_DEPARTMENT_OTHER): Payer: 59

## 2021-05-29 DIAGNOSIS — Z0000 Encounter for general adult medical examination without abnormal findings: Secondary | ICD-10-CM

## 2021-05-30 LAB — CBC WITH DIFFERENTIAL/PLATELET
Basophils Absolute: 0 10*3/uL (ref 0.0–0.2)
Basos: 1 %
EOS (ABSOLUTE): 0.1 10*3/uL (ref 0.0–0.4)
Eos: 1 %
Hematocrit: 53.3 % — ABNORMAL HIGH (ref 37.5–51.0)
Hemoglobin: 17.8 g/dL — ABNORMAL HIGH (ref 13.0–17.7)
Immature Grans (Abs): 0 10*3/uL (ref 0.0–0.1)
Immature Granulocytes: 0 %
Lymphocytes Absolute: 2.5 10*3/uL (ref 0.7–3.1)
Lymphs: 49 %
MCH: 25.5 pg — ABNORMAL LOW (ref 26.6–33.0)
MCHC: 33.4 g/dL (ref 31.5–35.7)
MCV: 76 fL — ABNORMAL LOW (ref 79–97)
Monocytes Absolute: 0.6 10*3/uL (ref 0.1–0.9)
Monocytes: 12 %
Neutrophils Absolute: 1.9 10*3/uL (ref 1.4–7.0)
Neutrophils: 37 %
Platelets: 287 10*3/uL (ref 150–450)
RBC: 6.98 x10E6/uL — ABNORMAL HIGH (ref 4.14–5.80)
RDW: 16.9 % — ABNORMAL HIGH (ref 11.6–15.4)
WBC: 5.2 10*3/uL (ref 3.4–10.8)

## 2021-05-30 LAB — LIPID PANEL
Chol/HDL Ratio: 2.8 ratio (ref 0.0–5.0)
Cholesterol, Total: 176 mg/dL (ref 100–199)
HDL: 62 mg/dL (ref >39)
LDL Chol Calc (NIH): 100 mg/dL — ABNORMAL HIGH (ref 0–99)
Triglycerides: 72 mg/dL (ref 0–149)
VLDL Cholesterol Cal: 14 mg/dL (ref 5–40)

## 2021-05-30 LAB — COMPREHENSIVE METABOLIC PANEL
ALT: 47 IU/L — ABNORMAL HIGH (ref 0–44)
AST: 29 IU/L (ref 0–40)
Albumin/Globulin Ratio: 1.5 (ref 1.2–2.2)
Albumin: 4.7 g/dL (ref 4.0–5.0)
Alkaline Phosphatase: 99 IU/L (ref 44–121)
BUN/Creatinine Ratio: 10 (ref 9–20)
BUN: 16 mg/dL (ref 6–20)
Bilirubin Total: 0.5 mg/dL (ref 0.0–1.2)
CO2: 27 mmol/L (ref 20–29)
Calcium: 10.6 mg/dL — ABNORMAL HIGH (ref 8.7–10.2)
Chloride: 98 mmol/L (ref 96–106)
Creatinine, Ser: 1.58 mg/dL — ABNORMAL HIGH (ref 0.76–1.27)
Globulin, Total: 3.1 g/dL (ref 1.5–4.5)
Glucose: 102 mg/dL — ABNORMAL HIGH (ref 70–99)
Potassium: 3.5 mmol/L (ref 3.5–5.2)
Sodium: 142 mmol/L (ref 134–144)
Total Protein: 7.8 g/dL (ref 6.0–8.5)
eGFR: 57 mL/min/{1.73_m2} — ABNORMAL LOW (ref >59)

## 2021-05-30 LAB — HEMOGLOBIN A1C
Est. average glucose Bld gHb Est-mCnc: 114 mg/dL
Hgb A1c MFr Bld: 5.6 % (ref 4.8–5.6)

## 2021-05-30 LAB — TSH RFX ON ABNORMAL TO FREE T4: TSH: 0.977 u[IU]/mL (ref 0.450–4.500)

## 2021-06-05 ENCOUNTER — Encounter (HOSPITAL_BASED_OUTPATIENT_CLINIC_OR_DEPARTMENT_OTHER): Payer: Self-pay | Admitting: Family Medicine

## 2021-06-05 ENCOUNTER — Ambulatory Visit (INDEPENDENT_AMBULATORY_CARE_PROVIDER_SITE_OTHER): Payer: 59 | Admitting: Family Medicine

## 2021-06-05 DIAGNOSIS — D751 Secondary polycythemia: Secondary | ICD-10-CM

## 2021-06-05 DIAGNOSIS — Z0000 Encounter for general adult medical examination without abnormal findings: Secondary | ICD-10-CM | POA: Diagnosis not present

## 2021-06-05 DIAGNOSIS — R7989 Other specified abnormal findings of blood chemistry: Secondary | ICD-10-CM

## 2021-06-05 DIAGNOSIS — J3089 Other allergic rhinitis: Secondary | ICD-10-CM

## 2021-06-05 MED ORDER — FLUTICASONE PROPIONATE 50 MCG/ACT NA SUSP: 1.0000 | Freq: Every day | NASAL | 1 refills | Status: DC

## 2021-06-05 MED ORDER — SULFAMETHOXAZOLE-TRIMETHOPRIM 800-160 MG PO TABS: 1.0000 | ORAL_TABLET | Freq: Two times a day (BID) | ORAL | 0 refills | Status: DC

## 2021-06-05 NOTE — Progress Notes (Signed)
?Subjective:   ? ?CC: Annual Physical Exam ? ?HPI:  ?Eugene Gomez is a 41 y.o. presenting for annual physical ? ?I reviewed the past medical history, family history, social history, surgical history, and allergies today and no changes were needed.  Please see the problem list section below in epic for further details. ? ?Past Medical History: ?Past Medical History:  ?Diagnosis Date  ? Hypertension   ? ?Past Surgical History: ?Past Surgical History:  ?Procedure Laterality Date  ? DENTAL SURGERY Left   ? WISDOM TOOTH EXTRACTION    ? ?Social History: ?Social History  ? ?Socioeconomic History  ? Marital status: Married  ?  Spouse name: Cindee Salt  ? Number of children: 2  ? Years of education: Associates  ? Highest education level: Not on file  ?Occupational History  ? Occupation: Psychologist, forensic  ?  Employer: [employer]  ?  Comment: (trash truck driver)  ? Occupation: Night Job  ?  Comment: [employer]  ?Tobacco Use  ? Smoking status: Former  ?  Packs/day: 2.00  ?  Years: 10.00  ?  Pack years: 20.00  ?  Types: Cigars, Cigarettes  ?  Quit date: 2018  ?  Years since quitting: 5.3  ? Smokeless tobacco: Never  ? Tobacco comments:  ?  smoked black and milds 1 a day   ?Vaping Use  ? Vaping Use: Never used  ?Substance and Sexual Activity  ? Alcohol use: Yes  ?  Alcohol/week: 1.0 - 10.0 standard drink  ?  Types: 1 - 10 Standard drinks or equivalent per week  ?  Comment: MIXED DRINKS  ? Drug use: No  ? Sexual activity: Yes  ?  Partners: Female  ?Other Topics Concern  ? Not on file  ?Social History Narrative  ? Lives with his wife and two daughters.  ? ?Social Determinants of Health  ? ?Financial Resource Strain: Not on file  ?Food Insecurity: Not on file  ?Transportation Needs: Not on file  ?Physical Activity: Not on file  ?Stress: Not on file  ?Social Connections: Not on file  ? ?Family History: ?Family History  ?Problem Relation Age of Onset  ? Hypertension Mother   ? Anemia Mother   ? Hypertension Father   ?  Diabetes Father   ? Hypertension Brother   ? Drug abuse Brother   ? Cancer Sister   ?     brain  ? Hypertension Maternal Aunt   ? Hypertension Maternal Uncle   ? Hypertension Paternal Aunt   ? Glaucoma Paternal Aunt   ? Diabetes Paternal Aunt   ? Hypertension Paternal Uncle   ? Diabetes Paternal Uncle   ? ?Allergies: ?Allergies  ?Allergen Reactions  ? Aspirin Hives  ? Olive Oil Rash  ? ?Medications: See med rec. ? ?Review of Systems: No headache, visual changes, nausea, vomiting, diarrhea, constipation, dizziness, abdominal pain, skin rash, fevers, chills, night sweats, swollen lymph nodes, weight loss, chest pain, body aches, joint swelling, muscle aches, shortness of breath, mood changes, visual or auditory hallucinations. ? ?Objective:   ? ?BP 138/86   Pulse 75   Temp 97.6 ?F (36.4 ?C) (Oral)   Ht 6' (1.829 m)   Wt 228 lb (103.4 kg)   SpO2 98%   BMI 30.92 kg/m?  ? ?General: Well Developed, well nourished, and in no acute distress.  ?Neuro: Alert and oriented x3, extra-ocular muscles intact, sensation grossly intact. Cranial nerves II through XII are intact, motor, sensory, and coordinative functions are  all intact. ?HEENT: Normocephalic, atraumatic, pupils equal round reactive to light, neck supple, no masses, no lymphadenopathy, thyroid nonpalpable. Oropharynx, nasopharynx, external ear canals are unremarkable. ?Skin: Warm and dry, no rashes noted.  Does have a small area of induration with opening near base of penis along left side.  No drainage currently, mild tenderness to palpation. ?Cardiac: Regular rate and rhythm, no murmurs rubs or gallops.  ?Respiratory: Clear to auscultation bilaterally. Not using accessory muscles, speaking in full sentences.  ?Abdominal: Soft, nontender, nondistended, positive bowel sounds, no masses, no organomegaly.  ?Musculoskeletal: Shoulder, elbow, wrist, hip, knee, ankle stable, and with full range of motion. ? ?Impression and Recommendations:   ? ?Wellness  examination ?Routine HCM labs reviewed. HCM reviewed/discussed. Anticipatory guidance regarding healthy weight, lifestyle and choices given. ?Recommend healthy diet.  Recommend approximately 150 minutes/week of moderate intensity exercise ?Recommend regular dental and vision exams ?Always use seatbelt/lap and shoulder restraints ?Recommend using smoke alarms and checking batteries at least twice a year ?Recommend using sunscreen when outside ?Patient is up-to-date on tetanus ? ?Recent labs with elevated serum creatinine, leukocytosis noted.  Labs do seem to suggest possible dehydration at time of lab draw.  We will plan to have patient recheck labs in about 3 to 4 weeks for monitoring.  If continuing to have laboratory abnormality, will need to further investigate ? ?Had what appears to be an abscess that has spontaneously ruptured.  Still has some local induration and tenderness.  Given location of area and residual symptoms, will treat with a short course of Bactrim, sent to pharmacy on file ? ?Completed form for his work with vitals and some of his recent lab results.  Copy has been provided to patient and we have also scanned a copy into the chart ? ? ?___________________________________________ ?Maximus Hoffert de [place], MD, ABFM, CAQSM ?Primary Care and Sports Medicine ?[facility] ?

## 2021-06-05 NOTE — Assessment & Plan Note (Signed)
Routine HCM labs reviewed. HCM reviewed/discussed. Anticipatory guidance regarding healthy weight, lifestyle and choices given. Recommend healthy diet.  Recommend approximately 150 minutes/week of moderate intensity exercise Recommend regular dental and vision exams Always use seatbelt/lap and shoulder restraints Recommend using smoke alarms and checking batteries at least twice a year Recommend using sunscreen when outside Patient is up-to-date on tetanus 

## 2021-06-05 NOTE — Patient Instructions (Signed)

## 2021-06-17 NOTE — Telephone Encounter (Signed)
na

## 2021-07-03 ENCOUNTER — Ambulatory Visit (HOSPITAL_BASED_OUTPATIENT_CLINIC_OR_DEPARTMENT_OTHER): Payer: 59

## 2021-07-04 LAB — CBC WITH DIFFERENTIAL/PLATELET
Basophils Absolute: 0 10*3/uL (ref 0.0–0.2)
Basos: 1 %
EOS (ABSOLUTE): 0.1 10*3/uL (ref 0.0–0.4)
Eos: 1 %
Hematocrit: 48.9 % (ref 37.5–51.0)
Hemoglobin: 16.2 g/dL (ref 13.0–17.7)
Immature Grans (Abs): 0 10*3/uL (ref 0.0–0.1)
Immature Granulocytes: 0 %
Lymphocytes Absolute: 2.6 10*3/uL (ref 0.7–3.1)
Lymphs: 47 %
MCH: 25.3 pg — ABNORMAL LOW (ref 26.6–33.0)
MCHC: 33.1 g/dL (ref 31.5–35.7)
MCV: 76 fL — ABNORMAL LOW (ref 79–97)
Monocytes Absolute: 0.5 10*3/uL (ref 0.1–0.9)
Monocytes: 10 %
Neutrophils Absolute: 2.3 10*3/uL (ref 1.4–7.0)
Neutrophils: 41 %
Platelets: 303 10*3/uL (ref 150–450)
RBC: 6.4 x10E6/uL — ABNORMAL HIGH (ref 4.14–5.80)
RDW: 17.5 % — ABNORMAL HIGH (ref 11.6–15.4)
WBC: 5.5 10*3/uL (ref 3.4–10.8)

## 2021-07-04 LAB — COMPREHENSIVE METABOLIC PANEL
ALT: 56 IU/L — ABNORMAL HIGH (ref 0–44)
AST: 44 IU/L — ABNORMAL HIGH (ref 0–40)
Albumin/Globulin Ratio: 1.4 (ref 1.2–2.2)
Albumin: 4.3 g/dL (ref 4.0–5.0)
Alkaline Phosphatase: 79 IU/L (ref 44–121)
BUN/Creatinine Ratio: 12 (ref 9–20)
BUN: 16 mg/dL (ref 6–20)
Bilirubin Total: 0.4 mg/dL (ref 0.0–1.2)
CO2: 25 mmol/L (ref 20–29)
Calcium: 10.2 mg/dL (ref 8.7–10.2)
Chloride: 102 mmol/L (ref 96–106)
Creatinine, Ser: 1.34 mg/dL — ABNORMAL HIGH (ref 0.76–1.27)
Globulin, Total: 3.1 g/dL (ref 1.5–4.5)
Glucose: 97 mg/dL (ref 70–99)
Potassium: 3.5 mmol/L (ref 3.5–5.2)
Sodium: 142 mmol/L (ref 134–144)
Total Protein: 7.4 g/dL (ref 6.0–8.5)
eGFR: 69 mL/min/{1.73_m2} (ref >59)

## 2021-09-05 ENCOUNTER — Ambulatory Visit
Admission: RE | Admit: 2021-09-05 | Discharge: 2021-09-05 | Disposition: A | Discharge status: Home / Self Care | Payer: 59 | Source: Ambulatory Visit | Attending: Physician Assistant | Admitting: Physician Assistant

## 2021-09-05 ENCOUNTER — Ambulatory Visit: Payer: 59 | Admitting: Cardiology

## 2021-09-05 VITALS — BP 149/104 | HR 68 | Temp 97.8°F | Resp 18

## 2021-09-05 DIAGNOSIS — K649 Unspecified hemorrhoids: Secondary | ICD-10-CM | POA: Diagnosis not present

## 2021-09-05 DIAGNOSIS — R112 Nausea with vomiting, unspecified: Secondary | ICD-10-CM | POA: Diagnosis not present

## 2021-09-05 DIAGNOSIS — R1084 Generalized abdominal pain: Secondary | ICD-10-CM

## 2021-09-05 MED ORDER — ONDANSETRON 4 MG PO TBDP: 4.0000 mg | ORAL_TABLET | Freq: Three times a day (TID) | ORAL | 0 refills | Status: DC | PRN

## 2021-09-05 MED ORDER — HYDROCORTISONE (PERIANAL) 2.5 % EX CREA: 1.0000 | TOPICAL_CREAM | Freq: Two times a day (BID) | CUTANEOUS | 0 refills | Status: DC

## 2021-09-05 NOTE — ED Triage Notes (Signed)
Pt presents to [facility] with co of abd pain diarrhea and intermittent nausea.  Nausea and vomiting starting yesterday with mild nausea today, no episodes of vomiting Pt reports diarrhea has been intermittent and going on for 2 weeks last solid BM yesterday.   Pt reports Hemorid bleeding and associated discomfort since yesterday. Pt reports using medicaid wipes and cream for discomfort.

## 2021-09-05 NOTE — ED Provider Notes (Signed)
[facility]    CSN: [id number] Arrival date & time: 09/05/21  1147      History   Chief Complaint Chief Complaint  Patient presents with   Hemorrhoids    had a hemorrhoid pop, having  pain in stomach also. - Entered by patient   Abdominal Pain    HPI Nahuel Wilbert is a 41 y.o. male.   Patient here today for evaluation of generalized abdominal pain, gas, and diarrhea that started 2 weeks ago. He has had some nausea and did vomit yesterday but thinks this might have been caused by Pepto Bismol. He denies any questionable foods, or concerns with anything he had eaten. He did have hemorrhoids and has had bleeding from same where he thinks they "popped". He denies any fever.   The history is provided by the patient.  Abdominal Pain Associated symptoms: diarrhea, nausea and vomiting   Associated symptoms: no chills, no constipation and no fever     Past Medical History:  Diagnosis Date   Hypertension     Patient Active Problem List   Diagnosis Date Noted   Wellness examination 06/05/2021   Hypokalemia 09/05/2020   Dizziness 07/12/2020   Tinnitus of right ear 07/12/2020   Prediabetes 05/23/2020   Abscess 05/17/2020   Resistant hypertension 01/05/2019   OSA (obstructive sleep apnea) 10/12/2018   Cough 05/25/2018   Heartburn 11/02/2016   Tinea versicolor 10/28/2016   Former smoker 05/21/2016   Allergic rhinitis 05/21/2016   BMI 33.0-33.9,adult 07/17/2015   Hepatitis B immune 10/26/2014   Seborrheic dermatitis 08/24/2014   Folliculitis 08/24/2014   Excessive sweating 08/24/2014   Acute meniscal tear of left knee 08/24/2014   Left shoulder pain 08/24/2014   Essential hypertension 07/02/2011    Past Surgical History:  Procedure Laterality Date   DENTAL SURGERY Left    WISDOM TOOTH EXTRACTION         Home Medications    Prior to Admission medications   Medication Sig Start Date End Date Taking? Authorizing Provider  hydrocortisone (ANUSOL-HC) 2.5  % rectal cream Place 1 Application rectally 2 (two) times daily. 09/05/21  Yes Tomi Bamberger, PA-C  ondansetron (ZOFRAN-ODT) 4 MG disintegrating tablet Take 1 tablet (4 mg total) by mouth every 8 (eight) hours as needed. 09/05/21  Yes Tomi Bamberger, PA-C  anastrozole (ARIMIDEX) 1 MG tablet Take 1 mg by mouth daily. 11/15/20   [provider]  carvedilol (COREG) 25 MG tablet Take 1 tablet (25 mg total) by mouth 2 (two) times daily with a meal. 09/05/20   Patwardhan, Manish J, MD  chlorthalidone (HYGROTON) 25 MG tablet Take 1 tablet (25 mg total) by mouth daily. 09/05/20   Patwardhan, Anabel Bene, MD  fluticasone (FLONASE) 50 MCG/ACT nasal spray Place 1 spray into both nostrils daily. 06/05/21   de [place], [name], MD  meclizine (ANTIVERT) 25 MG tablet Take 1 tablet (25 mg total) by mouth every 8 (eight) hours as needed for dizziness. Patient not taking: Reported on 06/05/2021 07/12/20   [place], [name], MD  Multiple Vitamin (MULTIVITAMIN) tablet Take 1 tablet by mouth daily.    [provider]  pantoprazole (PROTONIX) 40 MG tablet TAKE 1 TABLET BY MOUTH EVERY DAY 04/26/21   [place], [name], MD  potassium chloride SA (KLOR-CON) 20 MEQ tablet Take 2 tablets (40 mEq total) by mouth daily. Patient not taking: Reported on 06/05/2021 09/05/20   Elder Negus, MD  sulfamethoxazole-trimethoprim (BACTRIM DS) 800-160 MG tablet Take  1 tablet by mouth 2 (two) times daily. 06/05/21   de [place], Raymond J, MD  testosterone cypionate (DEPOTESTOSTERONE CYPIONATE) 200 MG/ML injection Inject 200 mg into the muscle every 14 (fourteen) days.    [provider]  verapamil (VERELAN PM) 360 MG 24 hr capsule Take 1 capsule (360 mg total) by mouth at bedtime. 09/05/20   Patwardhan, Anabel Bene, MD  Vitamin D, Ergocalciferol, (DRISDOL) 1.25 MG (50000 UNIT) CAPS capsule Take 50,000 Units by mouth once a week. Patient not taking: Reported on 06/05/2021 08/27/20   [provider]    Family  History Family History  Problem Relation Age of Onset   Hypertension Mother    Anemia Mother    Hypertension Father    Diabetes Father    Hypertension Brother    Drug abuse Brother    Cancer Sister        brain   Hypertension Maternal Aunt    Hypertension Maternal Uncle    Hypertension Paternal Aunt    Glaucoma Paternal Aunt    Diabetes Paternal Aunt    Hypertension Paternal Uncle    Diabetes Paternal Uncle     Social History Social History   Tobacco Use   Smoking status: Former    Packs/day: 2.00    Years: 10.00    Total pack years: 20.00    Types: Cigars, Cigarettes    Quit date: 2018    Years since quitting: 5.5   Smokeless tobacco: Never   Tobacco comments:    smoked black and milds 1 a day   Vaping Use   Vaping Use: Never used  Substance Use Topics   Alcohol use: Yes    Alcohol/week: 1.0 - 10.0 standard drink of alcohol    Types: 1 - 10 Standard drinks or equivalent per week    Comment: MIXED DRINKS   Drug use: No     Allergies   Aspirin and Olive oil   Review of Systems Review of Systems  Constitutional:  Negative for chills and fever.  Eyes:  Negative for discharge and redness.  Gastrointestinal:  Positive for abdominal pain, diarrhea, nausea and vomiting. Negative for constipation.     Physical Exam Triage Vital Signs ED Triage Vitals  Enc Vitals Group     BP 09/05/21 1207 (!) 153/105     Pulse Rate 09/05/21 1207 68     Resp 09/05/21 1207 18     Temp 09/05/21 1207 97.8 F (36.6 C)     Temp Source 09/05/21 1207 Oral     SpO2 09/05/21 1207 95 %     Weight --      Height --      Head Circumference --      Peak Flow --      Pain Score 09/05/21 1206 6     Pain Loc --      Pain Edu? --      Excl. in GC? --    No data found.  Updated Vital Signs BP (!) 149/104 (BP Location: Left Arm)   Pulse 68   Temp 97.8 F (36.6 C) (Oral)   Resp 18   SpO2 95%      Physical Exam Vitals and nursing note reviewed.  Constitutional:       General: He is not in acute distress.    Appearance: Normal appearance. He is not ill-appearing.  HENT:     Head: Normocephalic and atraumatic.  Eyes:     Conjunctiva/sclera: Conjunctivae normal.  Cardiovascular:  Rate and Rhythm: Normal rate and regular rhythm.     Heart sounds: Normal heart sounds. No murmur heard. Pulmonary:     Effort: Pulmonary effort is normal. No respiratory distress.     Breath sounds: Normal breath sounds. No wheezing, rhonchi or rales.  Abdominal:     General: Abdomen is flat. Bowel sounds are normal. There is no distension.     Palpations: Abdomen is soft.     Tenderness: There is no abdominal tenderness. There is no guarding or rebound.  Skin:    General: Skin is warm and dry.  Neurological:     Mental Status: He is alert.  Psychiatric:        Mood and Affect: Mood normal.        Behavior: Behavior normal.      UC Treatments / Results  Labs (all labs ordered are listed, but only abnormal results are displayed) Labs Reviewed  CBC WITH DIFFERENTIAL/PLATELET  COMPREHENSIVE METABOLIC PANEL    EKG   Radiology No results found.  Procedures Procedures (including critical care time)  Medications Ordered in UC Medications - No data to display  Initial Impression / Assessment and Plan / UC Course  I have reviewed the triage vital signs and the nursing notes.  Pertinent labs & imaging results that were available during my care of the patient were reviewed by me and considered in my medical decision making (see chart for details).    Discussed differential including partial obstruction,etc. Patient does have elevated BP in office, but otherwise appears stable and is in no acute distress. Will order labs and will trial zofran and steroid cream for hemorrhoids. Recommended further evaluation if symptoms do not start to improve or worsen.   Final Clinical Impressions(s) / UC Diagnoses   Final diagnoses:  Generalized abdominal pain  Nausea  and vomiting, unspecified vomiting type  Hemorrhoids, unspecified hemorrhoid type   Discharge Instructions   None    ED Prescriptions     Medication Sig Dispense Auth. Provider   ondansetron (ZOFRAN-ODT) 4 MG disintegrating tablet Take 1 tablet (4 mg total) by mouth every 8 (eight) hours as needed. 20 tablet Ewell Poe F, PA-C   hydrocortisone (ANUSOL-HC) 2.5 % rectal cream Place 1 Application rectally 2 (two) times daily. 30 g Francene Finders, PA-C      PDMP not reviewed this encounter.   Francene Finders, PA-C 09/05/21 1350

## 2021-09-06 LAB — CBC WITH DIFFERENTIAL/PLATELET
Basophils Absolute: 0 10*3/uL (ref 0.0–0.2)
Basos: 1 %
EOS (ABSOLUTE): 0.1 10*3/uL (ref 0.0–0.4)
Eos: 2 %
Hematocrit: 48.5 % (ref 37.5–51.0)
Hemoglobin: 15.6 g/dL (ref 13.0–17.7)
Immature Grans (Abs): 0 10*3/uL (ref 0.0–0.1)
Immature Granulocytes: 0 %
Lymphocytes Absolute: 2.5 10*3/uL (ref 0.7–3.1)
Lymphs: 43 %
MCH: 25.4 pg — ABNORMAL LOW (ref 26.6–33.0)
MCHC: 32.2 g/dL (ref 31.5–35.7)
MCV: 79 fL (ref 79–97)
Monocytes Absolute: 0.5 10*3/uL (ref 0.1–0.9)
Monocytes: 8 %
Neutrophils Absolute: 2.7 10*3/uL (ref 1.4–7.0)
Neutrophils: 46 %
Platelets: 297 10*3/uL (ref 150–450)
RBC: 6.15 x10E6/uL — ABNORMAL HIGH (ref 4.14–5.80)
RDW: 16.4 % — ABNORMAL HIGH (ref 11.6–15.4)
WBC: 5.8 10*3/uL (ref 3.4–10.8)

## 2021-09-06 LAB — COMPREHENSIVE METABOLIC PANEL
ALT: 49 IU/L — ABNORMAL HIGH (ref 0–44)
AST: 23 IU/L (ref 0–40)
Albumin/Globulin Ratio: 1.4 (ref 1.2–2.2)
Albumin: 4.3 g/dL (ref 4.1–5.1)
Alkaline Phosphatase: 87 IU/L (ref 44–121)
BUN/Creatinine Ratio: 9 (ref 9–20)
BUN: 13 mg/dL (ref 6–20)
Bilirubin Total: 0.3 mg/dL (ref 0.0–1.2)
CO2: 26 mmol/L (ref 20–29)
Calcium: 10.1 mg/dL (ref 8.7–10.2)
Chloride: 102 mmol/L (ref 96–106)
Creatinine, Ser: 1.45 mg/dL — ABNORMAL HIGH (ref 0.76–1.27)
Globulin, Total: 3 g/dL (ref 1.5–4.5)
Glucose: 99 mg/dL (ref 70–99)
Potassium: 3.8 mmol/L (ref 3.5–5.2)
Sodium: 143 mmol/L (ref 134–144)
Total Protein: 7.3 g/dL (ref 6.0–8.5)
eGFR: 63 mL/min/{1.73_m2} (ref >59)

## 2021-09-11 ENCOUNTER — Other Ambulatory Visit: Payer: Self-pay | Admitting: Cardiology

## 2021-09-11 ENCOUNTER — Ambulatory Visit: Payer: 59 | Admitting: Cardiology

## 2021-09-11 ENCOUNTER — Encounter: Payer: Self-pay | Admitting: Cardiology

## 2021-09-11 VITALS — BP 149/98 | HR 61 | Temp 98.0°F | Resp 16 | Ht 72.0 in | Wt 231.0 lb

## 2021-09-11 DIAGNOSIS — I10 Essential (primary) hypertension: Secondary | ICD-10-CM

## 2021-09-11 DIAGNOSIS — I1A0 Resistant hypertension: Secondary | ICD-10-CM

## 2021-09-11 NOTE — Progress Notes (Signed)
Patient referred by [place], Blondell Reveal, MD for treatment resistant HTN   Subjective:   Eugene Gomez, male    DOB: Jun 24, 1980, 41 y.o.   MRN: [id number]   Chief Complaint  Patient presents with   Hypertension   Follow-up    1 year    41 y/o [place] American male with resistant hypertension.  He has not tolerated amlodipine 10 mg due to leg edema, and lisinopril due to cough, spironolactone due to breast tenderness, Bidil due to nausea.  Patient reports that he is compliant with medical therapy, and the blood pressure is well controlled at home.  However, blood pressure is elevated today.  He has recently been taking ibuprofen for pain associated with hemorrhoids.   Current Outpatient Medications:    anastrozole (ARIMIDEX) 1 MG tablet, Take 1 mg by mouth daily., Disp: , Rfl:    carvedilol (COREG) 25 MG tablet, Take 1 tablet (25 mg total) by mouth 2 (two) times daily with a meal., Disp: 180 tablet, Rfl: 3   chlorthalidone (HYGROTON) 25 MG tablet, Take 1 tablet (25 mg total) by mouth daily., Disp: 90 tablet, Rfl: 3   fluticasone (FLONASE) 50 MCG/ACT nasal spray, Place 1 spray into both nostrils daily., Disp: 16 g, Rfl: 1   hydrocortisone (ANUSOL-HC) 2.5 % rectal cream, Place 1 Application rectally 2 (two) times daily., Disp: 30 g, Rfl: 0   meclizine (ANTIVERT) 25 MG tablet, Take 1 tablet (25 mg total) by mouth every 8 (eight) hours as needed for dizziness., Disp: 21 tablet, Rfl: 0   Multiple Vitamin (MULTIVITAMIN) tablet, Take 1 tablet by mouth daily., Disp: , Rfl:    ondansetron (ZOFRAN-ODT) 4 MG disintegrating tablet, Take 1 tablet (4 mg total) by mouth every 8 (eight) hours as needed., Disp: 20 tablet, Rfl: 0   pantoprazole (PROTONIX) 40 MG tablet, TAKE 1 TABLET BY MOUTH EVERY DAY, Disp: 90 tablet, Rfl: 1   testosterone cypionate (DEPOTESTOSTERONE CYPIONATE) 200 MG/ML injection, Inject 200 mg into the muscle every 14 (fourteen) days., Disp: , Rfl:    verapamil (VERELAN PM) 360 MG  24 hr capsule, Take 1 capsule (360 mg total) by mouth at bedtime., Disp: 90 capsule, Rfl: 3   Cardiovascular studies:  EKG 09/11/2021: Sinus rhythm 61 bpm Normal EKG  Renal artery duplex  12/16/2018: No evidence of renal artery occlusive disease in either renal artery. Normal intrarenal vascular perfusion is noted in both kidneys. Renal length is within normal limits for both kidneys. Simple cyst measuring 3x3cm noted.  Normal abdominal aorta flow velocities noted.   Echocardiogram 10/13/2018: Left ventricle cavity is normal in size. Moderate concentric hypertrophy of the left ventricle. Normal LV systolic function with EF 62%. Normal global wall motion. Normal diastolic filling pattern.  Mild (Grade I) mitral regurgitation. Inadequate TR jet to estimate pulmonary artery systolic pressure. Normal right atrial pressure.    Recent labs: 09/05/2021: Glucose 99, BUN/Cr 13/1.45. EGFR 63. Na/K 143/3.8. ALT 49. Rest of the CMP normal H/H 15/48. MCV 79. Platelets 297 HbA1C 5.6% Chol 176, TG 72, HDL 62, LDL 100 TSH 0.9 normal  Review of Systems  Eyes:  Negative for visual disturbance.  Cardiovascular:  Negative for chest pain, dyspnea on exertion, leg swelling, palpitations and syncope.  Neurological:  Positive for dizziness and loss of balance. Negative for focal weakness and paresthesias.       Vitals:   09/11/21 1025  BP: (!) 151/106  Pulse: 64  Resp: 16  Temp: 98 F (36.7 C)  SpO2: 97%     Objective:   Physical Exam Vitals and nursing note reviewed.  Constitutional:      General: He is not in acute distress. Neck:     Vascular: No JVD.  Cardiovascular:     Rate and Rhythm: Normal rate and regular rhythm.     Heart sounds: Normal heart sounds. No murmur heard. Pulmonary:     Effort: Pulmonary effort is normal.     Breath sounds: Normal breath sounds. No wheezing or rales.  Musculoskeletal:     Right lower leg: No edema.     Left lower leg: No edema.          Assessment & Recommendations:   41 y/o [place] American male with resistant hypertension.  Resistant hypertension: Secondary workup has been negative.  Blood pressure elevated today, generally well controlled.  Encouraged to check regularly at home for next few weeks.  I recommend recent NSAID use could be contributing.  We will recheck blood pressure at next visit.  If remains elevated, could consider switching carvedilol to labetalol, or verapamil to amlodipine.    I have again encouraged him to complete sleep study, as this could be a contributing factor.   F/u in 4 weeks    [employer], PA-C 09/11/2021, 8:51 AM Office: [phone]

## 2021-09-26 ENCOUNTER — Other Ambulatory Visit: Payer: Self-pay | Admitting: Cardiology

## 2021-09-26 DIAGNOSIS — I10 Essential (primary) hypertension: Secondary | ICD-10-CM

## 2021-09-27 ENCOUNTER — Other Ambulatory Visit: Payer: Self-pay | Admitting: Cardiology

## 2021-09-27 DIAGNOSIS — I10 Essential (primary) hypertension: Secondary | ICD-10-CM

## 2021-10-02 ENCOUNTER — Ambulatory Visit (HOSPITAL_BASED_OUTPATIENT_CLINIC_OR_DEPARTMENT_OTHER): Payer: 59 | Admitting: Family Medicine

## 2021-10-02 ENCOUNTER — Encounter (HOSPITAL_BASED_OUTPATIENT_CLINIC_OR_DEPARTMENT_OTHER): Payer: Self-pay | Admitting: Family Medicine

## 2021-10-02 DIAGNOSIS — G4733 Obstructive sleep apnea (adult) (pediatric): Secondary | ICD-10-CM | POA: Diagnosis not present

## 2021-10-02 DIAGNOSIS — I10 Essential (primary) hypertension: Secondary | ICD-10-CM

## 2021-10-02 NOTE — Progress Notes (Signed)
    Procedures performed today:    None.  Independent interpretation of notes and tests performed by another provider:   None.  Brief History, Exam, Impression, and Recommendations:    BP (!) 151/106   Pulse 62   Ht 6' (1.829 m)   Wt 228 lb 8 oz (103.6 kg)   SpO2 100%   BMI 30.99 kg/m   Resistant hypertension Patient recently had follow-up with cardiology earlier this month.  Blood pressure was elevated during office visit with them and is also slightly elevated in office here today.  Cardiologist recommended follow-up about 4 weeks after office visit for close monitoring of blood pressure He does continue with verapamil, carvedilol, chlorthalidone.  Denies any current issues today. Does report that he has not been as attentive to his lifestyle modifications, did have recent vacation and has been tough to get back into his normal dietary goals including reduce salt and healthy diet choices.  He has also recently returned to exercising. No medication adjustments to be made today.  Recommend continued close follow-up with cardiology in the near future Recommend intermittent monitoring at home, DASH diet  OSA (obstructive sleep apnea) Diagnosed in the past and was utilizing CPAP, however reports receiving notification about recall on his device and stop using machine and never ended up following up with his sleep medicine specialist or his DME company.  He is interested in resuming treatment and reestablishing with sleep medicine provider. I have placed referral today to sleep medicine specialist at office where he was previously going, advised that they should reach out to him within the next 1 to 2 weeks.  If he has not heard from them in that time, advised that he reach back out to [place] to check on status He is aware of importance of treating underlying sleep apnea and risks associated with untreated sleep apnea  Return in about 4 months (around 02/01/2022) for  HTN.   ___________________________________________ Eugene Tangen de [place], MD, ABFM, CAQSM Primary Care and Sports Medicine [facility]

## 2021-10-02 NOTE — Patient Instructions (Signed)
  Medication Instructions:  Your physician recommends that you continue on your current medications as directed. Please refer to the Current Medication list given to you today. --If you need a refill on any your medications before your next appointment, please call your pharmacy first. If no refills are authorized on file call the office.-- Lab Work: Your physician has recommended that you have lab work today: No If you have labs (blood work) drawn today and your tests are completely normal, you will receive your results via MyChart message OR a phone call from our staff.  Please ensure you check your voicemail in the event that you authorized detailed messages to be left on a delegated number. If you have any lab test that is abnormal or we need to change your treatment, we will call you to review the results.  Referrals/Procedures/Imaging: No  Follow-Up: Your next appointment:   Your physician recommends that you schedule a follow-up appointment in: 2-4 months with Dr. de [place]  You will receive a text message or e-mail with a link to a survey about your care and experience with us today! We would greatly appreciate your feedback!   Thanks for letting us be apart of your health journey!!  Primary Care and Sports Medicine   Dr. Raymond de [place]   We encourage you to activate your patient portal called "MyChart".  Sign up information is provided on this After Visit Summary.  MyChart is used to connect with patients for Virtual Visits (Telemedicine).  Patients are able to view lab/test results, encounter notes, upcoming appointments, etc.  Non-urgent messages can be sent to your provider as well. To learn more about what you can do with MyChart, please visit --  [email or web address].    

## 2021-10-02 NOTE — Assessment & Plan Note (Signed)
Patient recently had follow-up with cardiology earlier this month.  Blood pressure was elevated during office visit with them and is also slightly elevated in office here today.  Cardiologist recommended follow-up about 4 weeks after office visit for close monitoring of blood pressure He does continue with verapamil, carvedilol, chlorthalidone.  Denies any current issues today. Does report that he has not been as attentive to his lifestyle modifications, did have recent vacation and has been tough to get back into his normal dietary goals including reduce salt and healthy diet choices.  He has also recently returned to exercising. No medication adjustments to be made today.  Recommend continued close follow-up with cardiology in the near future Recommend intermittent monitoring at home, DASH diet

## 2021-10-02 NOTE — Assessment & Plan Note (Signed)
Diagnosed in the past and was utilizing CPAP, however reports receiving notification about recall on his device and stop using machine and never ended up following up with his sleep medicine specialist or his DME company.  He is interested in resuming treatment and reestablishing with sleep medicine provider. I have placed referral today to sleep medicine specialist at office where he was previously going, advised that they should reach out to him within the next 1 to 2 weeks.  If he has not heard from them in that time, advised that he reach back out to [place] to check on status He is aware of importance of treating underlying sleep apnea and risks associated with untreated sleep apnea

## 2021-10-11 ENCOUNTER — Ambulatory Visit: Payer: 59 | Admitting: Cardiology

## 2021-10-23 ENCOUNTER — Encounter (HOSPITAL_BASED_OUTPATIENT_CLINIC_OR_DEPARTMENT_OTHER): Payer: Self-pay

## 2021-10-23 ENCOUNTER — Telehealth: Payer: Self-pay

## 2021-10-25 ENCOUNTER — Encounter: Payer: Self-pay | Admitting: Cardiology

## 2021-10-25 NOTE — Telephone Encounter (Signed)
Letter sent.

## 2021-10-25 NOTE — Telephone Encounter (Signed)
Called and spoke to patient to make him aware of his letter

## 2021-10-29 ENCOUNTER — Other Ambulatory Visit (HOSPITAL_BASED_OUTPATIENT_CLINIC_OR_DEPARTMENT_OTHER): Payer: Self-pay | Admitting: Family Medicine

## 2021-10-29 DIAGNOSIS — R12 Heartburn: Secondary | ICD-10-CM

## 2021-10-31 ENCOUNTER — Encounter: Payer: Self-pay | Admitting: Cardiology

## 2021-10-31 ENCOUNTER — Ambulatory Visit: Payer: 59 | Admitting: Cardiology

## 2021-10-31 VITALS — BP 153/107 | HR 64 | Temp 98.0°F | Resp 17 | Ht 72.0 in | Wt 235.0 lb

## 2021-10-31 DIAGNOSIS — I10 Essential (primary) hypertension: Secondary | ICD-10-CM

## 2021-10-31 NOTE — Progress Notes (Signed)
Patient referred by [place], Blondell Reveal, MD for treatment resistant HTN   Subjective:   Eugene Gomez, male    DOB: [birth year], 41 y.o.   MRN: [id number]   Chief Complaint  Patient presents with   Hypertension   Follow-up    4 week   41 y/o [place] American male with resistant hypertension.  He has not tolerated amlodipine 10 mg due to leg edema, and lisinopril due to cough, spironolactone due to breast tenderness, Bidil due to nausea.  Blood pressure again elevated in office visit. He reports normal blood pressure a week ago. He has been taking some pre-workout supplements.    Current Outpatient Medications:    anastrozole (ARIMIDEX) 1 MG tablet, Take 1 mg by mouth daily., Disp: , Rfl:    carvedilol (COREG) 25 MG tablet, TAKE 1 TABLET (25 MG TOTAL) BY MOUTH 2 (TWO) TIMES DAILY WITH A MEAL., Disp: 180 tablet, Rfl: 3   chlorthalidone (HYGROTON) 25 MG tablet, TAKE 1 TABLET (25 MG TOTAL) BY MOUTH DAILY., Disp: 30 tablet, Rfl: 11   fluticasone (FLONASE) 50 MCG/ACT nasal spray, Place 1 spray into both nostrils daily., Disp: 16 g, Rfl: 1   hydrocortisone (ANUSOL-HC) 2.5 % rectal cream, Place 1 Application rectally 2 (two) times daily., Disp: 30 g, Rfl: 0   meclizine (ANTIVERT) 25 MG tablet, Take 1 tablet (25 mg total) by mouth every 8 (eight) hours as needed for dizziness., Disp: 21 tablet, Rfl: 0   Multiple Vitamin (MULTIVITAMIN) tablet, Take 1 tablet by mouth daily., Disp: , Rfl:    ondansetron (ZOFRAN-ODT) 4 MG disintegrating tablet, Take 1 tablet (4 mg total) by mouth every 8 (eight) hours as needed., Disp: 20 tablet, Rfl: 0   pantoprazole (PROTONIX) 40 MG tablet, TAKE 1 TABLET BY MOUTH EVERY DAY, Disp: 30 tablet, Rfl: 5   testosterone cypionate (DEPOTESTOSTERONE CYPIONATE) 200 MG/ML injection, Inject 200 mg into the muscle every 14 (fourteen) days., Disp: , Rfl:    verapamil (VERELAN PM) 360 MG 24 hr capsule, TAKE 1 CAPSULE (360 MG TOTAL) BY MOUTH AT BEDTIME., Disp: 30 capsule, Rfl:  11   Cardiovascular studies:  EKG 09/11/2021: Sinus rhythm 61 bpm Normal EKG  Renal artery duplex  12/16/2018: No evidence of renal artery occlusive disease in either renal artery. Normal intrarenal vascular perfusion is noted in both kidneys. Renal length is within normal limits for both kidneys. Simple cyst measuring 3x3cm noted.  Normal abdominal aorta flow velocities noted.   Echocardiogram 10/13/2018: Left ventricle cavity is normal in size. Moderate concentric hypertrophy of the left ventricle. Normal LV systolic function with EF 62%. Normal global wall motion. Normal diastolic filling pattern.  Mild (Grade I) mitral regurgitation. Inadequate TR jet to estimate pulmonary artery systolic pressure. Normal right atrial pressure.    Recent labs: 09/05/2021: Glucose 99, BUN/Cr 13/1.45. EGFR 63. Na/K 143/3.8. ALT 49. Rest of the CMP normal H/H 15/48. MCV 79. Platelets 297 HbA1C 5.6% Chol 176, TG 72, HDL 62, LDL 100 TSH 0.9 normal  Review of Systems  Eyes:  Negative for visual disturbance.  Cardiovascular:  Negative for chest pain, dyspnea on exertion, leg swelling, palpitations and syncope.  Neurological:  Positive for dizziness and loss of balance. Negative for focal weakness and paresthesias.       Vitals:   10/31/21 1137 10/31/21 1141  BP: (!) 155/103 (!) 153/107  Pulse: (!) 57 64  Resp: 17   Temp: 98 F (36.7 C)   SpO2: 98%  Objective:   Physical Exam Vitals and nursing note reviewed.  Constitutional:      General: He is not in acute distress. Neck:     Vascular: No JVD.  Cardiovascular:     Rate and Rhythm: Normal rate and regular rhythm.     Heart sounds: Normal heart sounds. No murmur heard. Pulmonary:     Effort: Pulmonary effort is normal.     Breath sounds: Normal breath sounds. No wheezing or rales.  Musculoskeletal:     Right lower leg: No edema.     Left lower leg: No edema.         Assessment & Recommendations:   41 y/o [place] American  male with resistant hypertension.  Resistant hypertension: Secondary workup has been negative.  Blood pressure elevated today, generally well controlled at home according to him., Avoid pre-workout supplements. Return in 4 weeks with home BP monitor and BP log.   F/u in 4 weeks    [employer], PA-C 10/31/2021, 9:29 AM Office: [phone]

## 2021-11-13 ENCOUNTER — Institutional Professional Consult (permissible substitution): Payer: 59 | Admitting: Neurology

## 2021-12-13 ENCOUNTER — Ambulatory Visit: Payer: 59 | Admitting: Cardiology

## 2022-01-10 ENCOUNTER — Encounter: Payer: Self-pay | Admitting: Cardiology

## 2022-01-10 ENCOUNTER — Ambulatory Visit: Payer: 59 | Admitting: Cardiology

## 2022-01-10 VITALS — BP 171/110 | HR 62 | Resp 16 | Ht 72.0 in | Wt 240.0 lb

## 2022-01-10 DIAGNOSIS — I1A0 Resistant hypertension: Secondary | ICD-10-CM

## 2022-01-10 NOTE — Progress Notes (Signed)
Patient referred by [place], Blondell Reveal, MD for treatment resistant HTN   Subjective:   Eugene Gomez, male    DOB: 04-Jan-1981, 41 y.o.   MRN: [id number]   Chief Complaint  Patient presents with   Hypertension   Follow-up    4 weeks   41 y/o [place] American male with resistant hypertension.  He has not tolerated amlodipine 10 mg due to leg edema, and lisinopril due to cough, spironolactone due to breast tenderness, Bidil due to nausea.  Blood pressure again elevated in office visit.  He has been busy with new job as well as finishing his graduation.  He admits to forgetting to take verapamil and evening dose of carvedilol.  Home blood pressures reportedly in 130s and 140s.  He does not have his blood pressure log on monitor with him today.      Current Outpatient Medications:    anastrozole (ARIMIDEX) 1 MG tablet, Take 1 mg by mouth daily., Disp: , Rfl:    carvedilol (COREG) 25 MG tablet, TAKE 1 TABLET (25 MG TOTAL) BY MOUTH 2 (TWO) TIMES DAILY WITH A MEAL., Disp: 180 tablet, Rfl: 3   chlorthalidone (HYGROTON) 25 MG tablet, TAKE 1 TABLET (25 MG TOTAL) BY MOUTH DAILY., Disp: 30 tablet, Rfl: 11   fluticasone (FLONASE) 50 MCG/ACT nasal spray, Place 1 spray into both nostrils daily., Disp: 16 g, Rfl: 1   hydrocortisone (ANUSOL-HC) 2.5 % rectal cream, Place 1 Application rectally 2 (two) times daily., Disp: 30 g, Rfl: 0   meclizine (ANTIVERT) 25 MG tablet, Take 1 tablet (25 mg total) by mouth every 8 (eight) hours as needed for dizziness., Disp: 21 tablet, Rfl: 0   Multiple Vitamin (MULTIVITAMIN) tablet, Take 1 tablet by mouth daily., Disp: , Rfl:    NON FORMULARY, Alphalion Pre- work out, Disp: , Rfl:    pantoprazole (PROTONIX) 40 MG tablet, TAKE 1 TABLET BY MOUTH EVERY DAY, Disp: 30 tablet, Rfl: 5   testosterone cypionate (DEPOTESTOSTERONE CYPIONATE) 200 MG/ML injection, Inject 200 mg into the muscle every 14 (fourteen) days., Disp: , Rfl:    verapamil (VERELAN PM) 360 MG 24 hr  capsule, TAKE 1 CAPSULE (360 MG TOTAL) BY MOUTH AT BEDTIME., Disp: 30 capsule, Rfl: 11   Cardiovascular studies:  EKG 09/11/2021: Sinus rhythm 61 bpm Normal EKG  Renal artery duplex  12/16/2018: No evidence of renal artery occlusive disease in either renal artery. Normal intrarenal vascular perfusion is noted in both kidneys. Renal length is within normal limits for both kidneys. Simple cyst measuring 3x3cm noted.  Normal abdominal aorta flow velocities noted.   Echocardiogram 10/13/2018: Left ventricle cavity is normal in size. Moderate concentric hypertrophy of the left ventricle. Normal LV systolic function with EF 62%. Normal global wall motion. Normal diastolic filling pattern.  Mild (Grade I) mitral regurgitation. Inadequate TR jet to estimate pulmonary artery systolic pressure. Normal right atrial pressure.    Recent labs: 09/05/2021: Glucose 99, BUN/Cr 13/1.45. EGFR 63. Na/K 143/3.8. ALT 49. Rest of the CMP normal H/H 15/48. MCV 79. Platelets 297 HbA1C 5.6% Chol 176, TG 72, HDL 62, LDL 100 TSH 0.9 normal  Review of Systems  Eyes:  Negative for visual disturbance.  Cardiovascular:  Negative for chest pain, dyspnea on exertion, leg swelling, palpitations and syncope.  Neurological:  Positive for dizziness and loss of balance. Negative for focal weakness and paresthesias.       Vitals:   01/10/22 1425  BP: (!) 171/110  Pulse: 62  Resp: 16  SpO2: 96%     Objective:   Physical Exam Vitals and nursing note reviewed.  Constitutional:      General: He is not in acute distress. Neck:     Vascular: No JVD.  Cardiovascular:     Rate and Rhythm: Normal rate and regular rhythm.     Heart sounds: Normal heart sounds. No murmur heard. Pulmonary:     Effort: Pulmonary effort is normal.     Breath sounds: Normal breath sounds. No wheezing or rales.  Musculoskeletal:     Right lower leg: No edema.     Left lower leg: No edema.        Assessment & Recommendations:   41  y/o [place] American male with resistant hypertension.  Resistant hypertension: Secondary workup has been negative.  He has not tolerated amlodipine 10 mg due to leg edema, and lisinopril due to cough, spironolactone due to breast tenderness, Bidil due to nausea. Blood pressure elevated today, generally well controlled at home according to him. He admits to noncompliance with evening medications. Emphasized importance of compliance.  He will bring blood pressure log and monitor to next visit. Given noted noncompliance as above, I have not made any changes to his medications today.  F/u in 3 months     Chayce Rullo Esther Hardy, PA-C 01/10/2022, 2:35 PM Office: [phone]

## 2022-01-31 ENCOUNTER — Encounter (HOSPITAL_BASED_OUTPATIENT_CLINIC_OR_DEPARTMENT_OTHER): Payer: Self-pay | Admitting: Family Medicine

## 2022-01-31 ENCOUNTER — Ambulatory Visit (HOSPITAL_BASED_OUTPATIENT_CLINIC_OR_DEPARTMENT_OTHER): Payer: Managed Care, Other (non HMO) | Admitting: Family Medicine

## 2022-01-31 VITALS — BP 154/103 | HR 116 | Resp 18 | Ht 72.0 in | Wt 241.6 lb

## 2022-01-31 DIAGNOSIS — I1A0 Resistant hypertension: Secondary | ICD-10-CM | POA: Diagnosis not present

## 2022-01-31 DIAGNOSIS — Z0000 Encounter for general adult medical examination without abnormal findings: Secondary | ICD-10-CM

## 2022-01-31 DIAGNOSIS — G4733 Obstructive sleep apnea (adult) (pediatric): Secondary | ICD-10-CM

## 2022-01-31 MED ORDER — HYDROCORTISONE (PERIANAL) 2.5 % EX CREA: 1.0000 | TOPICAL_CREAM | Freq: Two times a day (BID) | CUTANEOUS | 0 refills | Status: DC

## 2022-01-31 NOTE — Assessment & Plan Note (Signed)
Previously referred to sleep medicine.  He did have appointment scheduled, however provider had emergency and appointment need to be canceled.  He is still needing to rearrange establishing visit, requesting information for referral office today so that he may do so.  We provided patient with contact information for sleep medicine office so that he may reach out and schedule establishing visit Discussed importance of further evaluation and treatment for underlying sleep apnea as this can make optimal control of chronic medical conditions more difficult

## 2022-01-31 NOTE — Progress Notes (Signed)
    Procedures performed today:    None.  Independent interpretation of notes and tests performed by another provider:   None.  Brief History, Exam, Impression, and Recommendations:    BP (!) 154/103 (BP Location: Right Arm, Patient Position: Sitting, Cuff Size: Large)   Pulse (!) 116   Resp 18   Ht 6' (1.829 m)   Wt 241 lb 9.6 oz (109.6 kg)   SpO2 100%   BMI 32.77 kg/m   Resistant hypertension Blood pressure is elevated in office today.  Was elevated at recent office visit with cardiology.  No changes were made to medication at that time as patient has been having some noncompliance with medication regimen.  He denies any current issues with chest pain or headaches.  Generally, patient has been feeling very well overall.  He has not been checking blood pressure regularly at home. Today, we will continue with current medication regimen.  Discussed recommendation for intermittent monitoring of blood pressure at home, DASH diet.  Advised on importance of maintaining blood pressure log and bring to follow-up visits both with cardiology and with [place] in order to review home readings and compared to readings obtained in the office  OSA (obstructive sleep apnea) Previously referred to sleep medicine.  He did have appointment scheduled, however provider had emergency and appointment need to be canceled.  He is still needing to rearrange establishing visit, requesting information for referral office today so that he may do so.  We provided patient with contact information for sleep medicine office so that he may reach out and schedule establishing visit Discussed importance of further evaluation and treatment for underlying sleep apnea as this can make optimal control of chronic medical conditions more difficult  Return in about 4 months (around 06/02/2022) for CPE - last CPE 06/05/21.   ___________________________________________ Sanye Ledesma de [place], MD, ABFM, CAQSM Primary Care and Sports  Medicine [facility]

## 2022-01-31 NOTE — Assessment & Plan Note (Signed)
Blood pressure is elevated in office today.  Was elevated at recent office visit with cardiology.  No changes were made to medication at that time as patient has been having some noncompliance with medication regimen.  He denies any current issues with chest pain or headaches.  Generally, patient has been feeling very well overall.  He has not been checking blood pressure regularly at home. Today, we will continue with current medication regimen.  Discussed recommendation for intermittent monitoring of blood pressure at home, DASH diet.  Advised on importance of maintaining blood pressure log and bring to follow-up visits both with cardiology and with [place] in order to review home readings and compared to readings obtained in the office

## 2022-04-21 ENCOUNTER — Ambulatory Visit: Payer: 59 | Admitting: Cardiology

## 2022-04-25 ENCOUNTER — Other Ambulatory Visit (HOSPITAL_BASED_OUTPATIENT_CLINIC_OR_DEPARTMENT_OTHER): Payer: Self-pay | Admitting: Family Medicine

## 2022-04-25 DIAGNOSIS — R12 Heartburn: Secondary | ICD-10-CM

## 2022-05-14 ENCOUNTER — Ambulatory Visit: Payer: 59 | Admitting: Cardiology

## 2022-05-26 ENCOUNTER — Encounter: Payer: Self-pay | Admitting: Cardiology

## 2022-05-26 ENCOUNTER — Ambulatory Visit: Payer: 59 | Admitting: Cardiology

## 2022-05-26 VITALS — BP 154/106 | HR 61 | Ht 72.0 in | Wt 246.0 lb

## 2022-05-26 DIAGNOSIS — M79604 Pain in right leg: Secondary | ICD-10-CM

## 2022-05-26 DIAGNOSIS — I10 Essential (primary) hypertension: Secondary | ICD-10-CM

## 2022-05-26 MED ORDER — LABETALOL HCL 300 MG PO TABS
300.0000 mg | ORAL_TABLET | Freq: Two times a day (BID) | ORAL | 3 refills | Status: DC
Start: 2022-05-26 — End: 2022-09-17

## 2022-05-26 NOTE — Progress Notes (Unsigned)
Patient referred by de Eugene Gomez, Eugene Kos, MD for treatment resistant HTN   Subjective:   Eugene Gomez, male    DOB: 1981/01/13, 42 y.o.   MRN: [id number]   Chief Complaint  Patient presents with   Hypertension   Follow-up    3 month   42 y/o [place] American male with resistant hypertension.  Patient is fairly compliant with medical therapy, but does not check blood pressure regularly at home.  He has stopped using preworkout protein supplements.   Current Outpatient Medications:    anastrozole (ARIMIDEX) 1 MG tablet, Take 1 mg by mouth daily., Disp: , Rfl:    carvedilol (COREG) 25 MG tablet, TAKE 1 TABLET (25 MG TOTAL) BY MOUTH 2 (TWO) TIMES DAILY WITH A MEAL., Disp: 180 tablet, Rfl: 3   chlorthalidone (HYGROTON) 25 MG tablet, TAKE 1 TABLET (25 MG TOTAL) BY MOUTH DAILY., Disp: 30 tablet, Rfl: 11   fluticasone (FLONASE) 50 MCG/ACT nasal spray, Place 1 spray into both nostrils daily., Disp: 16 g, Rfl: 1   hydrocortisone (ANUSOL-HC) 2.5 % rectal cream, Place 1 Application rectally 2 (two) times daily., Disp: 30 g, Rfl: 0   meclizine (ANTIVERT) 25 MG tablet, Take 1 tablet (25 mg total) by mouth every 8 (eight) hours as needed for dizziness., Disp: 21 tablet, Rfl: 0   Multiple Vitamin (MULTIVITAMIN) tablet, Take 1 tablet by mouth daily., Disp: , Rfl:    pantoprazole (PROTONIX) 40 MG tablet, TAKE 1 TABLET BY MOUTH EVERY DAY, Disp: 30 tablet, Rfl: 5   testosterone cypionate (DEPOTESTOSTERONE CYPIONATE) 200 MG/ML injection, Inject 200 mg into the muscle every 14 (fourteen) days., Disp: , Rfl:    verapamil (VERELAN PM) 360 MG 24 hr capsule, TAKE 1 CAPSULE (360 MG TOTAL) BY MOUTH AT BEDTIME., Disp: 30 capsule, Rfl: 11   NON FORMULARY, Alphalion Pre- work out (Patient not taking: Reported on 05/26/2022), Disp: , Rfl:    Cardiovascular studies:  EKG 05/26/2022: Sinus rhythm 53 bpm LVH   Renal artery duplex  12/16/2018: No evidence of renal artery occlusive disease in either renal  artery. Normal intrarenal vascular perfusion is noted in both kidneys. Renal length is within normal limits for both kidneys. Simple cyst measuring 3x3cm noted.  Normal abdominal aorta flow velocities noted.   Echocardiogram 10/13/2018: Left ventricle cavity is normal in size. Moderate concentric hypertrophy of the left ventricle. Normal LV systolic function with EF 62%. Normal global wall motion. Normal diastolic filling pattern.  Mild (Grade I) mitral regurgitation. Inadequate TR jet to estimate pulmonary artery systolic pressure. Normal right atrial pressure.    Recent labs: 09/05/2021: Glucose 99, BUN/Cr 13/1.45. EGFR 63. Na/K 143/3.8. ALT 49. Rest of the CMP normal H/H 15/48. MCV 79. Platelets 297 HbA1C 5.6% Chol 176, TG 72, HDL 62, LDL 100 TSH 0.9 normal  Review of Systems  Eyes:  Negative for visual disturbance.  Cardiovascular:  Negative for chest pain, dyspnea on exertion, leg swelling, palpitations and syncope.  Neurological:  Positive for dizziness and loss of balance. Negative for focal weakness and paresthesias.       Vitals:   05/26/22 1232 05/26/22 1235  BP: (!) 154/107 (!) 154/106  Pulse: 65 61  SpO2: 98%      Objective:   Physical Exam Vitals and nursing note reviewed.  Constitutional:      General: He is not in acute distress. Neck:     Vascular: No JVD.  Cardiovascular:     Rate and Rhythm: Normal rate and regular rhythm.  Heart sounds: Normal heart sounds. No murmur heard. Pulmonary:     Effort: Pulmonary effort is normal.     Breath sounds: Normal breath sounds. No wheezing or rales.  Musculoskeletal:     Right lower leg: No edema.     Left lower leg: No edema.         Assessment & Recommendations:   42 y/o [place] American male with resistant hypertension.  Resistant hypertension: Secondary workup has been negative.  He has not tolerated amlodipine 10 mg due to leg edema, and lisinopril due to cough, spironolactone due to breast  tenderness, Bidil due to nausea. Blood pressure elevated today, generally well controlled at home according to him. Compliance improved. Change carvedilol 25 mg twice daily, to labetalol 300 mg twice daily. Continue rest of the antihypertensive therapy. If blood pressure remains uncontrolled in spite of multiple agents, with then deemed refractory, and consider candidacy for renal denervation.  F/u in 4 weeks    [employer], PA-C 05/26/2022, 12:36 PM Office: [phone]

## 2022-05-27 ENCOUNTER — Encounter: Payer: Self-pay | Admitting: Cardiology

## 2022-06-05 ENCOUNTER — Ambulatory Visit (HOSPITAL_BASED_OUTPATIENT_CLINIC_OR_DEPARTMENT_OTHER): Payer: Managed Care, Other (non HMO)

## 2022-06-10 ENCOUNTER — Encounter (HOSPITAL_BASED_OUTPATIENT_CLINIC_OR_DEPARTMENT_OTHER): Payer: Managed Care, Other (non HMO) | Admitting: Family Medicine

## 2022-06-12 ENCOUNTER — Encounter: Payer: Self-pay | Admitting: Cardiology

## 2022-06-12 DIAGNOSIS — I1A0 Resistant hypertension: Secondary | ICD-10-CM

## 2022-06-13 MED ORDER — HYDRALAZINE HCL 50 MG PO TABS
50.0000 mg | ORAL_TABLET | Freq: Three times a day (TID) | ORAL | 3 refills | Status: DC
Start: 2022-06-13 — End: 2022-07-17

## 2022-06-13 MED ORDER — EPLERENONE 25 MG PO TABS
25.0000 mg | ORAL_TABLET | Freq: Every day | ORAL | 2 refills | Status: DC
Start: 2022-06-13 — End: 2022-07-17

## 2022-06-13 NOTE — Telephone Encounter (Signed)
he still takes his other BP meds correct?

## 2022-06-13 NOTE — Telephone Encounter (Signed)
ICD-10-CM   1. Resistant hypertension  I1A.0 eplerenone (INSPRA) 25 MG tablet    hydrALAZINE (APRESOLINE) 50 MG tablet    Basic metabolic panel     Orders Placed This Encounter  Procedures   Basic metabolic panel    Meds ordered this encounter  Medications   eplerenone (INSPRA) 25 MG tablet    Sig: Take 1 tablet (25 mg total) by mouth daily.    Dispense:  30 tablet    Refill:  2   hydrALAZINE (APRESOLINE) 50 MG tablet    Sig: Take 1 tablet (50 mg total) by mouth 3 (three) times daily.    Dispense:  90 tablet    Refill:  3

## 2022-06-13 NOTE — Telephone Encounter (Signed)
Patient is having high BP (MP patient)

## 2022-06-16 NOTE — Telephone Encounter (Signed)
From patient.

## 2022-06-16 NOTE — Telephone Encounter (Signed)
Does he take testosterone supplements? Reasonable to try holding the other supplements too and see if blood pressure improves.  Thanks MJP

## 2022-06-26 ENCOUNTER — Ambulatory Visit: Payer: 59

## 2022-06-26 ENCOUNTER — Other Ambulatory Visit: Payer: 59

## 2022-06-26 DIAGNOSIS — M79604 Pain in right leg: Secondary | ICD-10-CM

## 2022-07-03 ENCOUNTER — Other Ambulatory Visit (HOSPITAL_BASED_OUTPATIENT_CLINIC_OR_DEPARTMENT_OTHER): Payer: Self-pay | Admitting: Family Medicine

## 2022-07-03 ENCOUNTER — Other Ambulatory Visit (HOSPITAL_BASED_OUTPATIENT_CLINIC_OR_DEPARTMENT_OTHER): Payer: Managed Care, Other (non HMO)

## 2022-07-04 LAB — CBC WITH DIFFERENTIAL/PLATELET
Basophils Absolute: 0 10*3/uL (ref 0.0–0.2)
Basos: 1 %
EOS (ABSOLUTE): 0.1 10*3/uL (ref 0.0–0.4)
Eos: 1 %
Hematocrit: 50.8 % (ref 37.5–51.0)
Hemoglobin: 16.9 g/dL (ref 13.0–17.7)
Immature Grans (Abs): 0 10*3/uL (ref 0.0–0.1)
Immature Granulocytes: 0 %
Lymphocytes Absolute: 2.3 10*3/uL (ref 0.7–3.1)
Lymphs: 31 %
MCH: 26.3 pg — ABNORMAL LOW (ref 26.6–33.0)
MCHC: 33.3 g/dL (ref 31.5–35.7)
MCV: 79 fL (ref 79–97)
Monocytes Absolute: 0.7 10*3/uL (ref 0.1–0.9)
Monocytes: 10 %
Neutrophils Absolute: 4.3 10*3/uL (ref 1.4–7.0)
Neutrophils: 57 %
Platelets: 266 10*3/uL (ref 150–450)
RBC: 6.42 x10E6/uL — ABNORMAL HIGH (ref 4.14–5.80)
RDW: 16.8 % — ABNORMAL HIGH (ref 11.6–15.4)
WBC: 7.4 10*3/uL (ref 3.4–10.8)

## 2022-07-04 LAB — COMPREHENSIVE METABOLIC PANEL
ALT: 60 IU/L — ABNORMAL HIGH (ref 0–44)
AST: 28 IU/L (ref 0–40)
Albumin/Globulin Ratio: 1.5 (ref 1.2–2.2)
Albumin: 4.6 g/dL (ref 4.1–5.1)
Alkaline Phosphatase: 115 IU/L (ref 44–121)
BUN/Creatinine Ratio: 11 (ref 9–20)
BUN: 15 mg/dL (ref 6–24)
Bilirubin Total: 0.2 mg/dL (ref 0.0–1.2)
CO2: 26 mmol/L (ref 20–29)
Calcium: 10.7 mg/dL — ABNORMAL HIGH (ref 8.7–10.2)
Chloride: 100 mmol/L (ref 96–106)
Creatinine, Ser: 1.42 mg/dL — ABNORMAL HIGH (ref 0.76–1.27)
Globulin, Total: 3 g/dL (ref 1.5–4.5)
Glucose: 83 mg/dL (ref 70–99)
Potassium: 4 mmol/L (ref 3.5–5.2)
Sodium: 142 mmol/L (ref 134–144)
Total Protein: 7.6 g/dL (ref 6.0–8.5)
eGFR: 64 mL/min/{1.73_m2} (ref >59)

## 2022-07-04 LAB — LIPID PANEL
Chol/HDL Ratio: 2.8 ratio (ref 0.0–5.0)
Cholesterol, Total: 147 mg/dL (ref 100–199)
HDL: 52 mg/dL (ref >39)
LDL Chol Calc (NIH): 77 mg/dL (ref 0–99)
Triglycerides: 98 mg/dL (ref 0–149)
VLDL Cholesterol Cal: 18 mg/dL (ref 5–40)

## 2022-07-04 LAB — HEMOGLOBIN A1C
Est. average glucose Bld gHb Est-mCnc: 111 mg/dL
Hgb A1c MFr Bld: 5.5 % (ref 4.8–5.6)

## 2022-07-04 LAB — TSH RFX ON ABNORMAL TO FREE T4: TSH: 0.655 u[IU]/mL (ref 0.450–4.500)

## 2022-07-09 ENCOUNTER — Ambulatory Visit: Payer: 59 | Admitting: Cardiology

## 2022-07-10 ENCOUNTER — Encounter (HOSPITAL_BASED_OUTPATIENT_CLINIC_OR_DEPARTMENT_OTHER): Payer: Self-pay | Admitting: Family Medicine

## 2022-07-10 ENCOUNTER — Ambulatory Visit (INDEPENDENT_AMBULATORY_CARE_PROVIDER_SITE_OTHER): Payer: Managed Care, Other (non HMO) | Admitting: Family Medicine

## 2022-07-10 VITALS — BP 140/104 | HR 64 | Temp 97.7°F | Ht 72.0 in | Wt 239.2 lb

## 2022-07-10 DIAGNOSIS — Z0000 Encounter for general adult medical examination without abnormal findings: Secondary | ICD-10-CM | POA: Diagnosis not present

## 2022-07-10 MED ORDER — HYDROCORTISONE (PERIANAL) 2.5 % EX CREA: 1.0000 | TOPICAL_CREAM | Freq: Two times a day (BID) | CUTANEOUS | 0 refills | Status: DC

## 2022-07-10 NOTE — Progress Notes (Signed)
Subjective:    CC: Annual Physical Exam  HPI: Eugene Gomez is a 42 y.o. presenting for annual physical  I reviewed the past medical history, family history, social history, surgical history, and allergies today and no changes were needed.  Please see the problem list section below in epic for further details.  Past Medical History: Past Medical History:  Diagnosis Date   Hypertension    Past Surgical History: Past Surgical History:  Procedure Laterality Date   DENTAL SURGERY Left    WISDOM TOOTH EXTRACTION     Social History: Social History   Socioeconomic History   Marital status: Married    Spouse name: Therapist, occupational   Number of children: 2   Years of education: Associates   Highest education level: Not on file  Occupational History   Occupation: Personnel officer: [employer]    Comment: (trash truck Hospital doctor)   Occupation: Night Job    Comment: [employer]  Tobacco Use   Smoking status: Former    Packs/day: 2.00    Years: 10.00    Additional pack years: 0.00    Total pack years: 20.00    Types: Cigars, Cigarettes    Quit date: 2018    Years since quitting: 6.4   Smokeless tobacco: Never   Tobacco comments:    smoked black and milds 1 a day   Vaping Use   Vaping Use: Never used  Substance and Sexual Activity   Alcohol use: Yes    Alcohol/week: 1.0 - 10.0 standard drink of alcohol    Types: 1 - 10 Standard drinks or equivalent per week    Comment: MIXED DRINKS   Drug use: No   Sexual activity: Yes    Partners: Female  Other Topics Concern   Not on file  Social History Narrative   Lives with his wife and two daughters.   Social Determinants of Health   Financial Resource Strain: Not on file  Food Insecurity: Not on file  Transportation Needs: Not on file  Physical Activity: Not on file  Stress: Not on file  Social Connections: Not on file   Family History: Family History  Problem Relation Age of Onset   Hypertension Mother     Anemia Mother    Hypertension Father    Diabetes Father    Hypertension Brother    Drug abuse Brother    Cancer Sister        brain   Hypertension Maternal Aunt    Hypertension Maternal Uncle    Hypertension Paternal Aunt    Glaucoma Paternal Aunt    Diabetes Paternal Aunt    Hypertension Paternal Uncle    Diabetes Paternal Uncle    Allergies: Allergies  Allergen Reactions   Aspirin Hives   Olive Oil Rash   Medications: See med rec.  Review of Systems: No headache, visual changes, nausea, vomiting, diarrhea, constipation, dizziness, abdominal pain, skin rash, fevers, chills, night sweats, swollen lymph nodes, weight loss, chest pain, body aches, joint swelling, muscle aches, shortness of breath, mood changes, visual or auditory hallucinations.  Objective:    BP (!) 157/106 (BP Location: Left Arm, Patient Position: Sitting, Cuff Size: Large)   Pulse 64   Temp 97.7 F (36.5 C) (Oral)   Ht 6' (1.829 m)   Wt 239 lb 3.2 oz (108.5 kg)   SpO2 99%   BMI 32.44 kg/m   General: Well Developed, well nourished, and in no acute distress. Neuro: Alert and  oriented x3, extra-ocular muscles intact, sensation grossly intact. Cranial nerves II through XII are intact, motor, sensory, and coordinative functions are all intact. HEENT: Normocephalic, atraumatic, pupils equal round reactive to light, neck supple, no masses, no lymphadenopathy, thyroid nonpalpable. Oropharynx, nasopharynx, external ear canals are unremarkable. Skin: Warm and dry, no rashes noted.  Cardiac: Regular rate and rhythm, no murmurs rubs or gallops. Respiratory: Clear to auscultation bilaterally. Not using accessory muscles, speaking in full sentences. Abdominal: Soft, nontender, nondistended, positive bowel sounds, no masses, no organomegaly. Musculoskeletal: Shoulder, elbow, wrist, hip, knee, ankle stable, and with full range of motion.  Impression and Recommendations:    Wellness examination Routine HCM labs  reviewed. HCM reviewed/discussed. Anticipatory guidance regarding healthy weight, lifestyle and choices given. Recommend healthy diet.  Recommend approximately 150 minutes/week of moderate intensity exercise Recommend regular dental and vision exams Always use seatbelt/lap and shoulder restraints Recommend using smoke alarms and checking batteries at least twice a year Recommend using sunscreen when outside Discussed immunization recommendations  Return in about 3 months (around 10/10/2022) for HTN.   ___________________________________________ Eugene Lukes de [place], MD, ABFM, CAQSM Primary Care and Sports Medicine [facility]

## 2022-07-10 NOTE — Assessment & Plan Note (Signed)

## 2022-07-17 ENCOUNTER — Ambulatory Visit: Payer: Managed Care, Other (non HMO) | Admitting: Cardiology

## 2022-07-17 ENCOUNTER — Encounter: Payer: Self-pay | Admitting: Cardiology

## 2022-07-17 VITALS — BP 155/94 | HR 75 | Resp 16 | Ht 72.0 in | Wt 242.4 lb

## 2022-07-17 DIAGNOSIS — I1A0 Resistant hypertension: Secondary | ICD-10-CM

## 2022-07-17 MED ORDER — HYDRALAZINE HCL 50 MG PO TABS: 50.0000 mg | ORAL_TABLET | Freq: Three times a day (TID) | ORAL | 3 refills | Status: DC

## 2022-07-17 NOTE — Progress Notes (Signed)
Patient referred by de Eugene Gomez, Eugene Kos, MD for treatment resistant HTN   Subjective:   Eugene Gomez, male    DOB: 03-02-1980, 42 y.o.   MRN: [id number]   Chief Complaint  Patient presents with   Hypertension   42 y/o [place] male with resistant hypertension.  Patient is frustrated that his blood pressure still remains uncontrolled. He has stopped all supplements, but has not made much difference. He has had decrease in libido as well as blurry vision, more so recently. He has not started recently prescribed eplerenone and hydralazine.    Current Outpatient Medications:    anastrozole (ARIMIDEX) 1 MG tablet, Take 1 mg by mouth daily., Disp: , Rfl:    chlorthalidone (HYGROTON) 25 MG tablet, TAKE 1 TABLET (25 MG TOTAL) BY MOUTH DAILY., Disp: 30 tablet, Rfl: 11   eplerenone (INSPRA) 25 MG tablet, Take 1 tablet (25 mg total) by mouth daily. (Patient not taking: Reported on 07/10/2022), Disp: 30 tablet, Rfl: 2   fluticasone (FLONASE) 50 MCG/ACT nasal spray, Place 1 spray into both nostrils daily., Disp: 16 g, Rfl: 1   hydrALAZINE (APRESOLINE) 50 MG tablet, Take 1 tablet (50 mg total) by mouth 3 (three) times daily. (Patient not taking: Reported on 07/10/2022), Disp: 90 tablet, Rfl: 3   hydrocortisone (ANUSOL-HC) 2.5 % rectal cream, Place 1 Application rectally 2 (two) times daily., Disp: 30 g, Rfl: 0   labetalol (NORMODYNE) 300 MG tablet, Take 1 tablet (300 mg total) by mouth 2 (two) times daily., Disp: 180 tablet, Rfl: 3   meclizine (ANTIVERT) 25 MG tablet, Take 1 tablet (25 mg total) by mouth every 8 (eight) hours as needed for dizziness., Disp: 21 tablet, Rfl: 0   Multiple Vitamin (MULTIVITAMIN) tablet, Take 1 tablet by mouth daily., Disp: , Rfl:    NON FORMULARY, Alphalion Pre- work out, Disp: , Rfl:    pantoprazole (PROTONIX) 40 MG tablet, TAKE 1 TABLET BY MOUTH EVERY DAY, Disp: 30 tablet, Rfl: 5   testosterone cypionate (DEPOTESTOSTERONE CYPIONATE) 200 MG/ML injection, Inject  200 mg into the muscle every 14 (fourteen) days., Disp: , Rfl:    verapamil (VERELAN PM) 360 MG 24 hr capsule, TAKE 1 CAPSULE (360 MG TOTAL) BY MOUTH AT BEDTIME., Disp: 30 capsule, Rfl: 11   Cardiovascular studies:  EKG 05/26/2022: Sinus rhythm 53 bpm LVH   Renal artery duplex  12/16/2018: No evidence of renal artery occlusive disease in either renal artery. Normal intrarenal vascular perfusion is noted in both kidneys. Renal length is within normal limits for both kidneys. Simple cyst measuring 3x3cm noted.  Normal abdominal aorta flow velocities noted.   Echocardiogram 10/13/2018: Left ventricle cavity is normal in size. Moderate concentric hypertrophy of the left ventricle. Normal LV systolic function with EF 62%. Normal global wall motion. Normal diastolic filling pattern.  Mild (Grade I) mitral regurgitation. Inadequate TR jet to estimate pulmonary artery systolic pressure. Normal right atrial pressure.    Recent labs: 09/05/2021: Glucose 99, BUN/Cr 13/1.45. EGFR 63. Na/K 143/3.8. ALT 49. Rest of the CMP normal H/H 15/48. MCV 79. Platelets 297 HbA1C 5.6% Chol 176, TG 72, HDL 62, LDL 100 TSH 0.9 normal  Review of Systems  Eyes:  Negative for visual disturbance.  Cardiovascular:  Negative for chest pain, dyspnea on exertion, leg swelling, palpitations and syncope.  Neurological:  Positive for dizziness and loss of balance. Negative for focal weakness and paresthesias.       Vitals:   07/17/22 1421 07/17/22 1424  BP: Marland Kitchen)  159/106 (!) 155/94  Pulse: 75 75  Resp: 16   SpO2:  96%     Objective:   Physical Exam Vitals and nursing note reviewed.  Constitutional:      General: He is not in acute distress. Neck:     Vascular: No JVD.  Cardiovascular:     Rate and Rhythm: Normal rate and regular rhythm.     Heart sounds: Normal heart sounds. No murmur heard. Pulmonary:     Effort: Pulmonary effort is normal.     Breath sounds: Normal breath sounds. No wheezing or rales.   Musculoskeletal:     Right lower leg: No edema.     Left lower leg: No edema.         Assessment & Recommendations:   42 y/o [place] male with resistant hypertension.  Resistant hypertension: Secondary workup has been negative.  He has not tolerated amlodipine 10 mg due to leg edema, and lisinopril due to cough, spironolactone due to breast tenderness, Bidil due to nausea. Blood pressure elevated today, generally well controlled at home according to him. Continue labetalol 300 mg bid, verapamil 360 mg daily, chlorthalidone 25 mg daily. Start hydralazine 50 mg tid. Given persistent resistant hypertension, we discussed possibility of renal denervation., In the meantime, I will refer him to hypertension clinic.   F/u in 2 months    Eugene Negus, PA-C 07/17/2022, 12:53 PM Office: [phone]

## 2022-08-24 ENCOUNTER — Other Ambulatory Visit (HOSPITAL_BASED_OUTPATIENT_CLINIC_OR_DEPARTMENT_OTHER): Payer: Self-pay | Admitting: Family Medicine

## 2022-08-24 DIAGNOSIS — R12 Heartburn: Secondary | ICD-10-CM

## 2022-08-25 ENCOUNTER — Other Ambulatory Visit (HOSPITAL_BASED_OUTPATIENT_CLINIC_OR_DEPARTMENT_OTHER): Payer: Self-pay

## 2022-08-25 DIAGNOSIS — R12 Heartburn: Secondary | ICD-10-CM

## 2022-08-25 MED ORDER — PANTOPRAZOLE SODIUM 40 MG PO TBEC
40.0000 mg | DELAYED_RELEASE_TABLET | Freq: Every day | ORAL | 1 refills | Status: DC
Start: 2022-08-25 — End: 2023-01-26

## 2022-09-17 ENCOUNTER — Ambulatory Visit: Payer: Managed Care, Other (non HMO) | Admitting: Cardiology

## 2022-09-17 ENCOUNTER — Encounter: Payer: Self-pay | Admitting: Cardiology

## 2022-09-17 DIAGNOSIS — I10 Essential (primary) hypertension: Secondary | ICD-10-CM

## 2022-09-17 DIAGNOSIS — I1A0 Resistant hypertension: Secondary | ICD-10-CM

## 2022-09-17 MED ORDER — HYDRALAZINE HCL 50 MG PO TABS: 50.0000 mg | ORAL_TABLET | Freq: Three times a day (TID) | ORAL | 3 refills | Status: DC

## 2022-09-17 MED ORDER — VERAPAMIL HCL ER 360 MG PO CP24: 360.0000 mg | ORAL_CAPSULE | Freq: Every day | ORAL | 3 refills | Status: DC

## 2022-09-17 MED ORDER — LABETALOL HCL 300 MG PO TABS: 300.0000 mg | ORAL_TABLET | Freq: Two times a day (BID) | ORAL | 3 refills | Status: DC

## 2022-09-17 MED ORDER — CHLORTHALIDONE 25 MG PO TABS: 25.0000 mg | ORAL_TABLET | Freq: Every day | ORAL | 3 refills | Status: DC

## 2022-09-17 NOTE — Progress Notes (Signed)
Patient referred by de [place], [name], MD for treatment resistant HTN   Subjective:   Eugene Gomez, male    DOB: 03/02/1980, 42 y.o.   MRN: [id number]   Chief Complaint  Patient presents with   Hypertension   Follow-up    2 month   42 y/o [place] male with resistant hypertension.  Blood pressure improved, although not optimally controlled yet. He has a CPAP for OSA but has not been using it as there was a recall on the device.    Current Outpatient Medications:    anastrozole (ARIMIDEX) 1 MG tablet, Take 1 mg by mouth daily., Disp: , Rfl:    chlorthalidone (HYGROTON) 25 MG tablet, TAKE 1 TABLET (25 MG TOTAL) BY MOUTH DAILY., Disp: 30 tablet, Rfl: 11   fluticasone (FLONASE) 50 MCG/ACT nasal spray, Place 1 spray into both nostrils daily., Disp: 16 g, Rfl: 1   hydrALAZINE (APRESOLINE) 50 MG tablet, Take 1 tablet (50 mg total) by mouth 3 (three) times daily., Disp: 270 tablet, Rfl: 3   hydrocortisone (ANUSOL-HC) 2.5 % rectal cream, Place 1 Application rectally 2 (two) times daily., Disp: 30 g, Rfl: 0   labetalol (NORMODYNE) 300 MG tablet, Take 1 tablet (300 mg total) by mouth 2 (two) times daily., Disp: 180 tablet, Rfl: 3   meclizine (ANTIVERT) 25 MG tablet, Take 1 tablet (25 mg total) by mouth every 8 (eight) hours as needed for dizziness., Disp: 21 tablet, Rfl: 0   Multiple Vitamin (MULTIVITAMIN) tablet, Take 1 tablet by mouth daily. (Patient not taking: Reported on 07/17/2022), Disp: , Rfl:    NON FORMULARY, Alphalion Pre- work out (Patient not taking: Reported on 07/17/2022), Disp: , Rfl:    pantoprazole (PROTONIX) 40 MG tablet, Take 1 tablet (40 mg total) by mouth daily., Disp: 90 tablet, Rfl: 1   testosterone cypionate (DEPOTESTOSTERONE CYPIONATE) 200 MG/ML injection, Inject 200 mg into the muscle every 14 (fourteen) days., Disp: , Rfl:    verapamil (VERELAN PM) 360 MG 24 hr capsule, TAKE 1 CAPSULE (360 MG TOTAL) BY MOUTH AT BEDTIME., Disp: 30 capsule, Rfl: 11    Cardiovascular studies:  EKG 05/26/2022: Sinus rhythm 53 bpm LVH   Renal artery duplex  12/16/2018: No evidence of renal artery occlusive disease in either renal artery. Normal intrarenal vascular perfusion is noted in both kidneys. Renal length is within normal limits for both kidneys. Simple cyst measuring 3x3cm noted.  Normal abdominal aorta flow velocities noted.   Echocardiogram 10/13/2018: Left ventricle cavity is normal in size. Moderate concentric hypertrophy of the left ventricle. Normal LV systolic function with EF 62%. Normal global wall motion. Normal diastolic filling pattern.  Mild (Grade I) mitral regurgitation. Inadequate TR jet to estimate pulmonary artery systolic pressure. Normal right atrial pressure.    Recent labs: 07/03/2022: Glucose 83, BUN/Cr 15/1.42. EGFR 64. Na/K 142/4.0. ALT 60. Rest of the CMP normal H/H 16/50. MCV 79. Platelets 266 HbA1C 5.5% Chol 147, TG 98, HDL 52, LDL 77 0.6 TSH normal  09/05/2021: Glucose 99, BUN/Cr 13/1.45. EGFR 63. Na/K 143/3.8. ALT 49. Rest of the CMP normal H/H 15/48. MCV 79. Platelets 297 HbA1C 5.6% Chol 176, TG 72, HDL 62, LDL 100 TSH 0.9 normal  Review of Systems  Eyes:  Negative for visual disturbance.  Cardiovascular:  Negative for chest pain, dyspnea on exertion, leg swelling, palpitations and syncope.  Neurological:  Positive for dizziness and loss of balance. Negative for focal weakness and paresthesias.       Vitals:  09/17/22 1050 09/17/22 1052  BP: (!) 155/100 (!) 149/96  Pulse: 65 64  Resp: 16   SpO2: 97%       Objective:   Physical Exam Vitals and nursing note reviewed.  Constitutional:      General: He is not in acute distress. Neck:     Vascular: No JVD.  Cardiovascular:     Rate and Rhythm: Normal rate and regular rhythm.     Heart sounds: Normal heart sounds. No murmur heard. Pulmonary:     Effort: Pulmonary effort is normal.     Breath sounds: Normal breath sounds. No wheezing or rales.   Musculoskeletal:     Right lower leg: No edema.     Left lower leg: No edema.         Assessment & Recommendations:   42 y/o [place] male with resistant hypertension.  Resistant hypertension: Secondary workup has been negative.  He has not tolerated amlodipine 10 mg due to leg edema, and lisinopril due to cough, spironolactone due to breast tenderness, Bidil due to nausea. Blood pressure improved but not optimally controlled yet. Recommend contacting [facility] to help with CPAP device. This could bring BP down further. Continue labetalol 300 mg bid, verapamil 360 mg daily, chlorthalidone 25 mg daily, hydralazine 50 mg tid.  F/u in 6 months    [employer], PA-C 09/17/2022, 8:40 AM Office: [phone]

## 2022-10-06 ENCOUNTER — Ambulatory Visit (HOSPITAL_BASED_OUTPATIENT_CLINIC_OR_DEPARTMENT_OTHER): Payer: Managed Care, Other (non HMO) | Admitting: Family Medicine

## 2022-10-06 VITALS — BP 169/114 | HR 63 | Ht 72.0 in | Wt 241.0 lb

## 2022-10-06 DIAGNOSIS — I1A0 Resistant hypertension: Secondary | ICD-10-CM | POA: Diagnosis not present

## 2022-10-06 DIAGNOSIS — J3089 Other allergic rhinitis: Secondary | ICD-10-CM

## 2022-10-06 DIAGNOSIS — M722 Plantar fascial fibromatosis: Secondary | ICD-10-CM | POA: Insufficient documentation

## 2022-10-06 DIAGNOSIS — N529 Male erectile dysfunction, unspecified: Secondary | ICD-10-CM | POA: Diagnosis not present

## 2022-10-06 MED ORDER — SILDENAFIL CITRATE 50 MG PO TABS: 50.0000 mg | ORAL_TABLET | Freq: Every day | ORAL | 0 refills | Status: DC | PRN

## 2022-10-06 MED ORDER — FLUTICASONE PROPIONATE 50 MCG/ACT NA SUSP
1.0000 | Freq: Every day | NASAL | 1 refills | Status: DC
Start: 2022-10-06 — End: 2022-11-03

## 2022-10-06 NOTE — Assessment & Plan Note (Signed)
Blood pressure continues to be elevated, he continues to follow with cardiology regarding this.  He reportedly takes medications as prescribed.  Has not tolerated certain classes of medications due to side effects.  He reports today that his cardiologist did mention possible procedure intervention such as renal denervation in order to treat his recent Resistant hypertension.  He does express some frustrations as he reports that he feels he has been taking his medications as well as working on lifestyle modifications to address blood pressure, however continues to have elevated blood pressure readings. We did discuss general considerations and reviewed recommended lifestyle modifications, we discussed that there can be a underlying genetic predisposition for elevated blood pressure readings.  Additionally reviewed kidneys as a potential driver for elevated blood pressure and ultimately consideration for procedural invention such as that discussed by his cardiologist. We discussed possibility of meeting with alternative provider for second opinion if desired.  At this time, would recommend continuing with current medications, lifestyle modifications, intermittent monitoring of blood pressure at home.

## 2022-10-06 NOTE — Progress Notes (Signed)
Procedures performed today:    None.  Independent interpretation of notes and tests performed by another provider:   None.  Brief History, Exam, Impression, and Recommendations:    BP (!) 169/114 Comment: repeat  Pulse 63   Ht 6' (1.829 m)   Wt 241 lb (109.3 kg)   SpO2 98%   BMI 32.69 kg/m   Chronic non-seasonal allergic rhinitis -     Fluticasone Propionate; Place 1 spray into both nostrils daily.  Dispense: 16 g; Refill: 1  Resistant hypertension Assessment & Plan: Blood pressure continues to be elevated, he continues to follow with cardiology regarding this.  He reportedly takes medications as prescribed.  Has not tolerated certain classes of medications due to side effects.  He reports today that his cardiologist did mention possible procedure intervention such as renal denervation in order to treat his recent Resistant hypertension.  He does express some frustrations as he reports that he feels he has been taking his medications as well as working on lifestyle modifications to address blood pressure, however continues to have elevated blood pressure readings. We did discuss general considerations and reviewed recommended lifestyle modifications, we discussed that there can be a underlying genetic predisposition for elevated blood pressure readings.  Additionally reviewed kidneys as a potential driver for elevated blood pressure and ultimately consideration for procedural invention such as that discussed by his cardiologist. We discussed possibility of meeting with alternative provider for second opinion if desired.  At this time, would recommend continuing with current medications, lifestyle modifications, intermittent monitoring of blood pressure at home.   Erectile dysfunction, unspecified erectile dysfunction type Assessment & Plan: Patient also reports issues with erectile dysfunction.  He has had intermittent issues with this, however has more recently been a problem  over recent months.  He reports that he did discuss this with his cardiologist, however no particular insight was given.  He has difficulty with both achieving and maintaining erection.  He does follow with [facility] related to hormone supplementation, reports receiving testosterone through them.  Indicates that he had some issues with eating in the past and did note some improvement when starting testosterone supplementation through [facility].  However, testosterone does not seem to be providing benefits at present like he did when he was utilizing in the past. We discussed general considerations, discussed potential risks and adverse reactions related to pharmacotherapy.  After discussion, patient elected to proceed with medication, advised on proper use.  Cautioned on monitoring for sudden low blood pressure readings, lightheadedness, dizziness.   Plantar fasciitis of left foot Assessment & Plan: Patient has been having left foot pain for a few weeks or more now.  No inciting event, does not recall any injury.  Pain is mostly through central part of bottom of foot.  Worse with increased activity, walking.  Has not tried much in regards to interventions as of yet.  Denies any prior similar issues.  No associated numbness or tingling. On exam, foot is normal on inspection, no skin changes, no bruising or erythema noted.  Patient does have tenderness to palpation distal to calcaneus, extending into forefoot, worst near ball of foot.  Distal neurovascular exam is intact.  Negative windlass test. Given history and exam, feel that plantar fasciitis is most likely causing current symptoms not completely typical.  Did discuss general measures including OTC medications, frozen water bottle for icing/massage.  Discussed home exercises and provided handout as well.  Discussed that symptoms can linger at times, would recommend consistent  use of interventions as above.   Other orders -     Sildenafil Citrate; Take 1  tablet (50 mg total) by mouth daily as needed for erectile dysfunction.  Dispense: 10 tablet; Refill: 0  Return in about 2 months (around 12/06/2022) for hypertension, med check.  Spent 43 minutes on this patient encounter, including preparation, chart review, face-to-face counseling with patient and coordination of care, and documentation of encounter   ___________________________________________ Fatemah Pourciau de [place], MD, ABFM, [facility] Primary Care and Sports Medicine [address]

## 2022-10-06 NOTE — Assessment & Plan Note (Signed)
Patient also reports issues with erectile dysfunction.  He has had intermittent issues with this, however has more recently been a problem over recent months.  He reports that he did discuss this with his cardiologist, however no particular insight was given.  He has difficulty with both achieving and maintaining erection.  He does follow with [facility] related to hormone supplementation, reports receiving testosterone through them.  Indicates that he had some issues with eating in the past and did note some improvement when starting testosterone supplementation through [facility].  However, testosterone does not seem to be providing benefits at present like he did when he was utilizing in the past. We discussed general considerations, discussed potential risks and adverse reactions related to pharmacotherapy.  After discussion, patient elected to proceed with medication, advised on proper use.  Cautioned on monitoring for sudden low blood pressure readings, lightheadedness, dizziness.

## 2022-10-06 NOTE — Assessment & Plan Note (Signed)
Patient has been having left foot pain for a few weeks or more now.  No inciting event, does not recall any injury.  Pain is mostly through central part of bottom of foot.  Worse with increased activity, walking.  Has not tried much in regards to interventions as of yet.  Denies any prior similar issues.  No associated numbness or tingling. On exam, foot is normal on inspection, no skin changes, no bruising or erythema noted.  Patient does have tenderness to palpation distal to calcaneus, extending into forefoot, worst near ball of foot.  Distal neurovascular exam is intact.  Negative windlass test. Given history and exam, feel that plantar fasciitis is most likely causing current symptoms not completely typical.  Did discuss general measures including OTC medications, frozen water bottle for icing/massage.  Discussed home exercises and provided handout as well.  Discussed that symptoms can linger at times, would recommend consistent use of interventions as above.

## 2022-11-03 ENCOUNTER — Other Ambulatory Visit (HOSPITAL_BASED_OUTPATIENT_CLINIC_OR_DEPARTMENT_OTHER): Payer: Self-pay | Admitting: Family Medicine

## 2022-11-03 DIAGNOSIS — J3089 Other allergic rhinitis: Secondary | ICD-10-CM

## 2022-12-05 ENCOUNTER — Ambulatory Visit (HOSPITAL_BASED_OUTPATIENT_CLINIC_OR_DEPARTMENT_OTHER): Payer: Managed Care, Other (non HMO) | Admitting: Family Medicine

## 2023-01-25 ENCOUNTER — Other Ambulatory Visit (HOSPITAL_BASED_OUTPATIENT_CLINIC_OR_DEPARTMENT_OTHER): Payer: Self-pay | Admitting: Family Medicine

## 2023-01-25 DIAGNOSIS — R12 Heartburn: Secondary | ICD-10-CM

## 2023-01-29 IMAGING — CT CT HEAD W/O CM
4 series · 16 of 47 positions shown, 18 images · non-contrast
Comparison: None.

CLINICAL DATA: Nonspecific dizziness over the last 2 days.

EXAM:
CT HEAD WITHOUT CONTRAST
TECHNIQUE: Contiguous axial images were obtained from the base of the skull
through the vertex without intravenous contrast.

[Series 3: head without · axial · non-contrast · 0.46mm/px · z∈[+1440,+1566]mm · 7 of 35 slices shown, 9 images]
[im 5/35  brain]
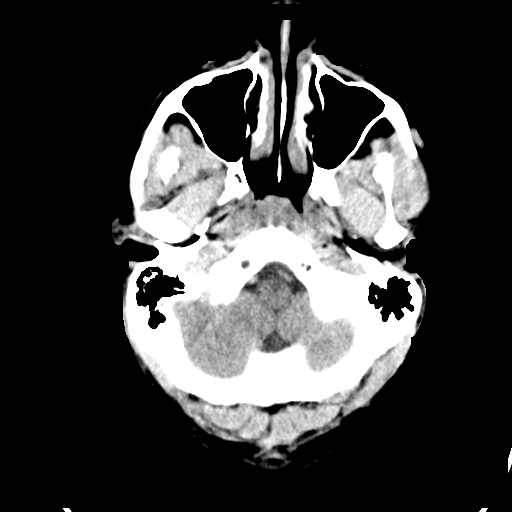
[im 5/35  bone]
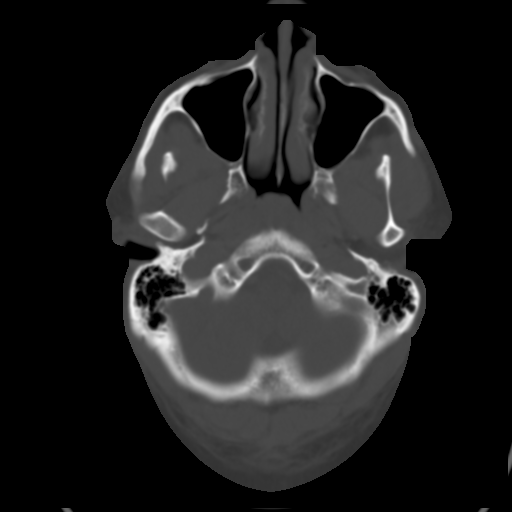
[im 9/35  brain]
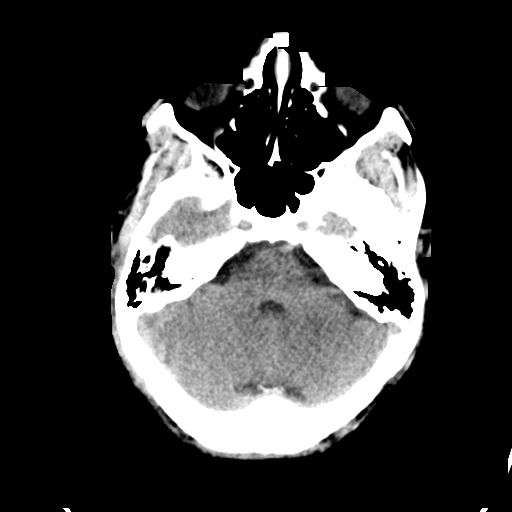
[im 13/35  brain]
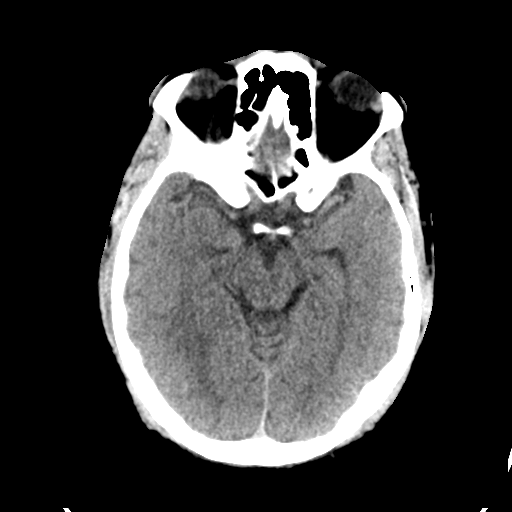
[im 18/35  brain]
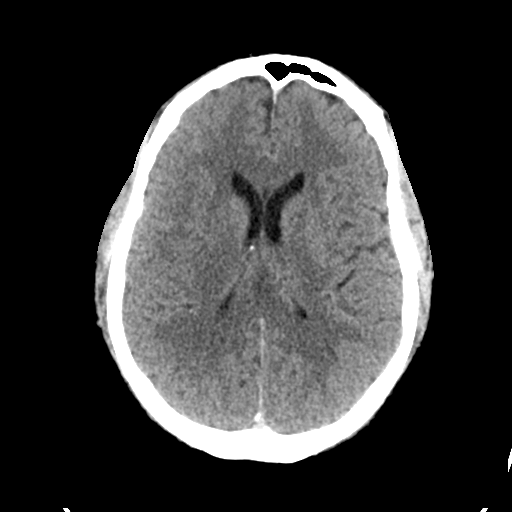
[im 22/35  brain]
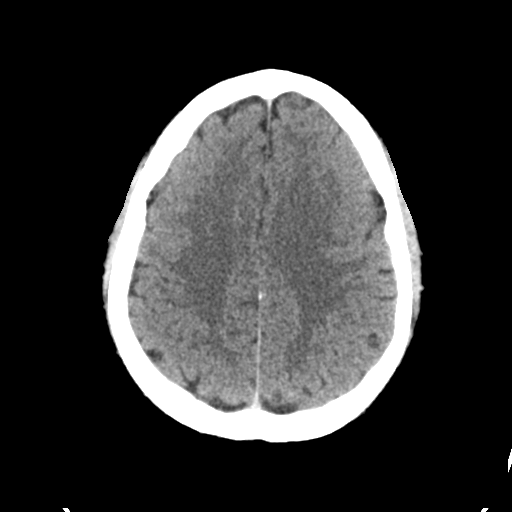
[im 22/35  bone]
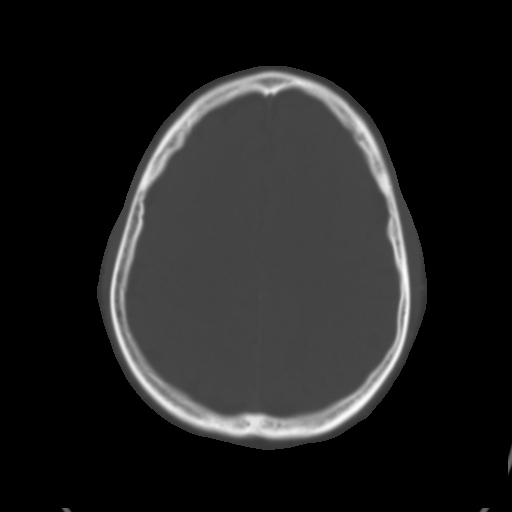
[im 26/35  brain]
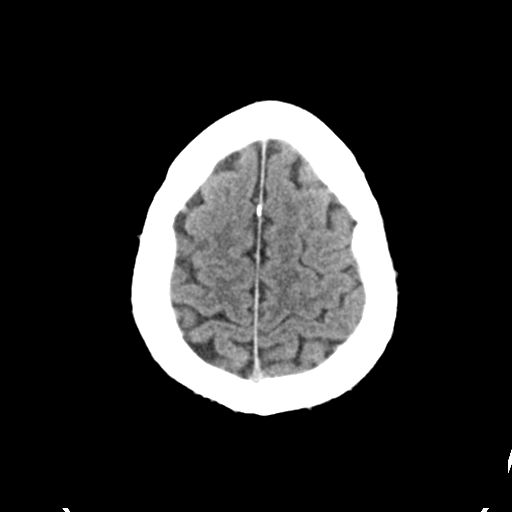
[im 30/35  brain]
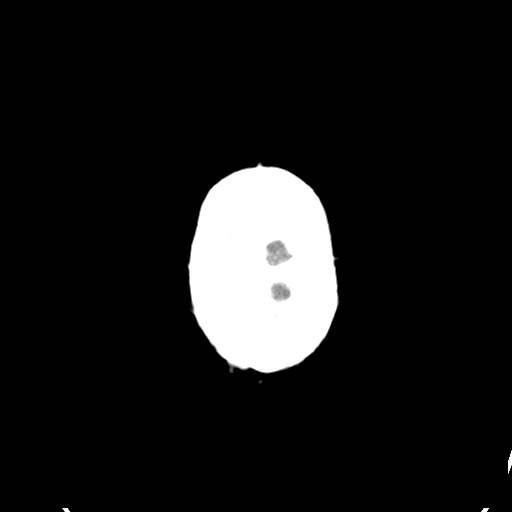

[Series 4: head bone · axial · 0.46mm/px · z∈[+1436,+1470]mm · 3 of 86 slices shown]
[im 9/86  bone]
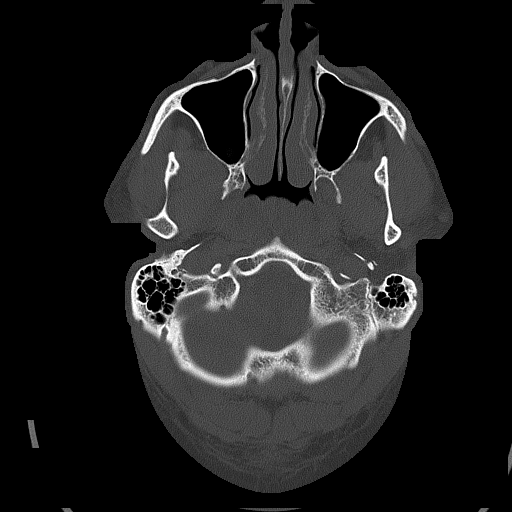
[im 18/86  bone]
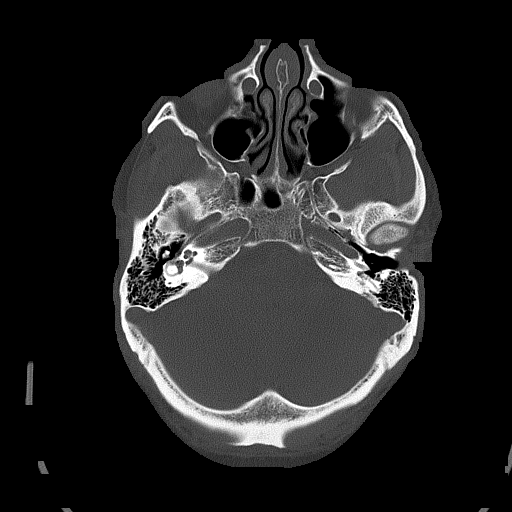
[im 26/86  bone]
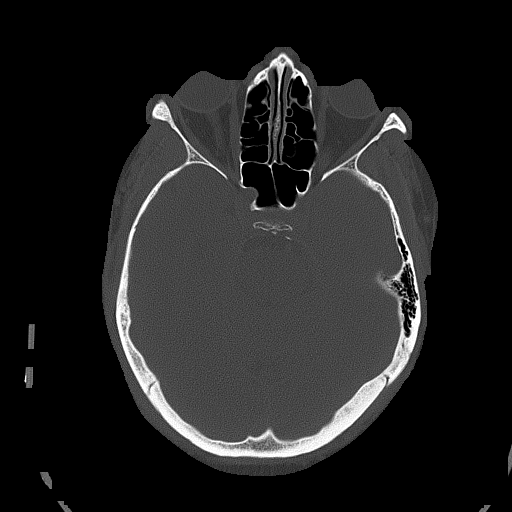

[Series 5: head without cor · coronal · non-contrast · 0.33mm/px · 3 of 75 slices shown]
[im 25/75  brain]
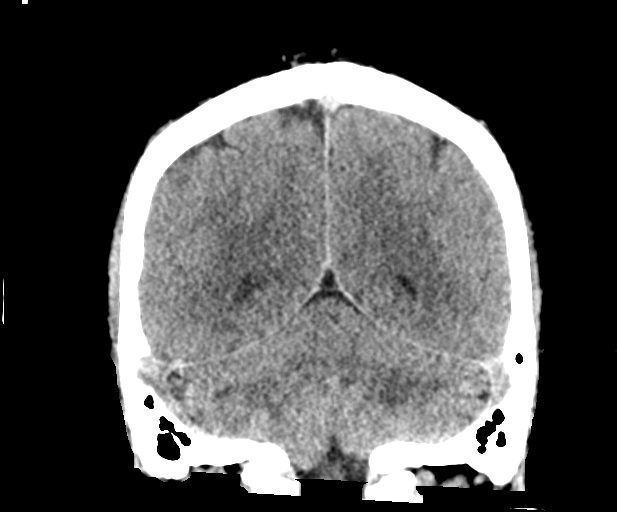
[im 33/75  brain]
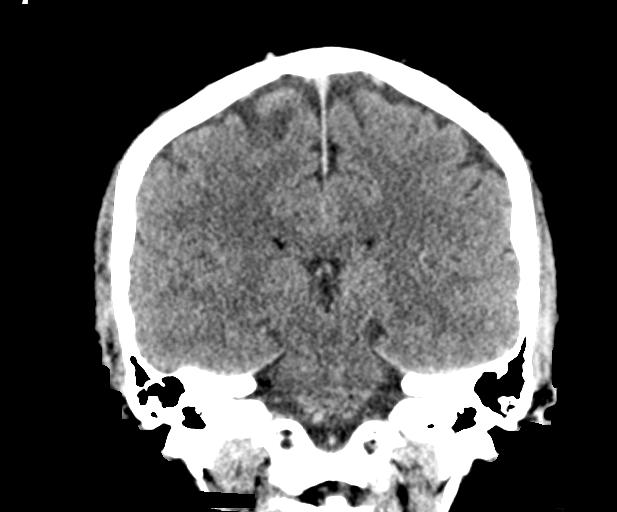
[im 42/75  brain]
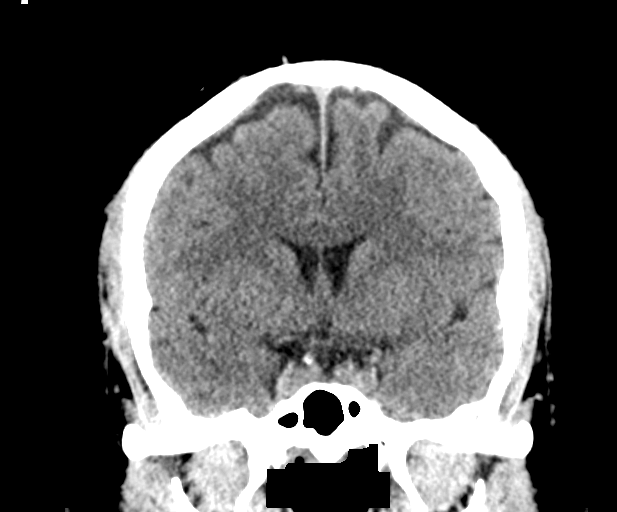

[Series 6: head without sag · sagittal · non-contrast · 0.33mm/px · 3 of 64 slices shown]
[im 22/64  brain]
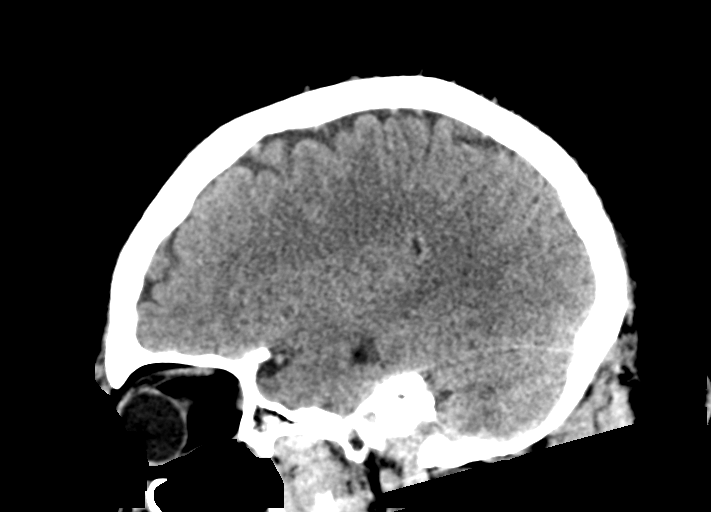
[im 32/64  brain]
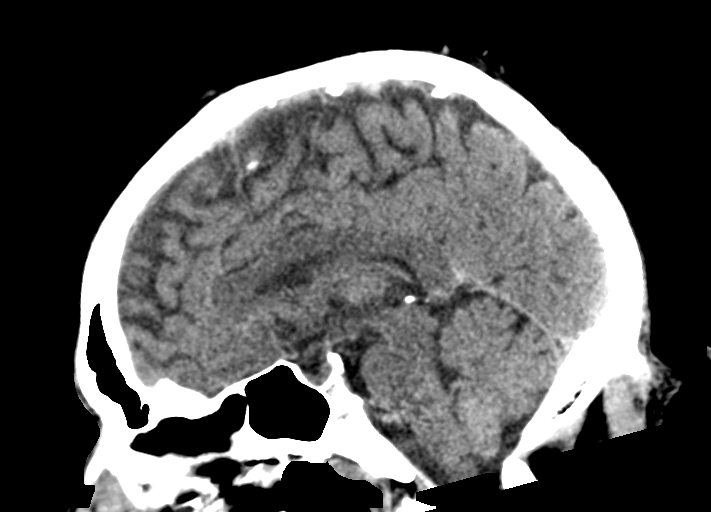
[im 43/64  brain]
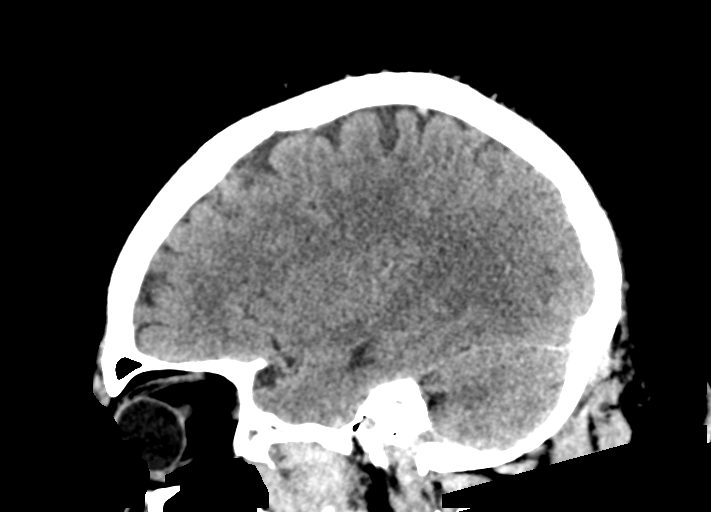

[16 of 47 positions shown; findings below may reference images not displayed]

FINDINGS: Brain: The brain shows a normal appearance without evidence of
malformation, atrophy, old or acute small or large vessel
infarction, mass lesion, hemorrhage, hydrocephalus or extra-axial
collection.

Vascular: No hyperdense vessel. No evidence of atherosclerotic
calcification.

Skull: Normal.  No traumatic finding.  No focal bone lesion.

Sinuses/Orbits: Sinuses are clear. Orbits appear normal. Mastoids
are clear.

Other: None significant
IMPRESSION: Normal examination. No abnormality seen to explain the clinical
presentation.

## 2023-03-19 ENCOUNTER — Ambulatory Visit: Payer: Self-pay | Admitting: Cardiology

## 2023-08-01 ENCOUNTER — Other Ambulatory Visit (HOSPITAL_BASED_OUTPATIENT_CLINIC_OR_DEPARTMENT_OTHER): Payer: Self-pay | Admitting: Family Medicine

## 2023-08-01 DIAGNOSIS — R12 Heartburn: Secondary | ICD-10-CM

## 2023-08-04 ENCOUNTER — Ambulatory Visit (HOSPITAL_BASED_OUTPATIENT_CLINIC_OR_DEPARTMENT_OTHER): Admitting: Family Medicine

## 2023-08-04 ENCOUNTER — Encounter (HOSPITAL_BASED_OUTPATIENT_CLINIC_OR_DEPARTMENT_OTHER): Payer: Self-pay | Admitting: Family Medicine

## 2023-08-04 VITALS — BP 157/102 | HR 72 | Ht 72.0 in | Wt 241.6 lb

## 2023-08-04 DIAGNOSIS — I1A0 Resistant hypertension: Secondary | ICD-10-CM

## 2023-08-04 DIAGNOSIS — Z0000 Encounter for general adult medical examination without abnormal findings: Secondary | ICD-10-CM

## 2023-08-04 DIAGNOSIS — R12 Heartburn: Secondary | ICD-10-CM | POA: Diagnosis not present

## 2023-08-04 DIAGNOSIS — L03012 Cellulitis of left finger: Secondary | ICD-10-CM | POA: Diagnosis not present

## 2023-08-04 MED ORDER — MUPIROCIN 2 % EX OINT: TOPICAL_OINTMENT | CUTANEOUS | 1 refills | Status: AC

## 2023-08-04 MED ORDER — PANTOPRAZOLE SODIUM 40 MG PO TBEC: 40.0000 mg | DELAYED_RELEASE_TABLET | Freq: Every day | ORAL | 1 refills | Status: DC

## 2023-08-04 NOTE — Progress Notes (Signed)
    Procedures performed today:    None.  Independent interpretation of notes and tests performed by another provider:   None.  Brief History, Exam, Impression, and Recommendations:    BP (!) 157/102 (BP Location: Right Arm, Patient Position: Sitting, Cuff Size: Large)   Pulse 72   Ht 6' (1.829 m)   Wt 241 lb 9.6 oz (109.6 kg)   SpO2 97%   BMI 32.77 kg/m   Resistant hypertension Assessment & Plan: Blood pressure elevated in office today.  He has been following with cardiology, however indicates that cardiologist that he has been seeing will no longer be seeing patients and he is requesting to establish with new cardiologist locally.  He continues with chlorthalidone , hydralazine , labetalol , verapamil .  Denies any issues with medications currently. Previously, his cardiologist did discuss potential interventional procedure given resistant hypertension with consideration for renal denervation. In the past, he has not tolerated spironolactone , amlodipine , lisinopril , BiDil  due to various side effects. At this time, can continue with current medication regimen.  Recommend intermittent monitoring of blood pressure at home, DASH diet.  We will also place referral for patient to establish with new cardiologist  Orders: -     Ambulatory referral to Cardiology  Heartburn -     Pantoprazole  Sodium; Take 1 tablet (40 mg total) by mouth daily.  Dispense: 90 tablet; Refill: 1  Paronychia of finger of left hand Assessment & Plan: Patient had evaluation with provider at his work and was diagnosed and treated for paronychia.  He reports recurrence of issue at this time. Exam consistent with paronychia, we reviewed considerations.  He does have some tenderness present, no significant swelling/fluctuance currently.  We discussed the potential for abscess formation necessitating drainage.  Given current swelling, no significant fluctuance at this time or discharge appreciated, we can treat initially  with warm soaks and application of mupirocin  3 times daily.  We will plan for follow-up in about a week to assess progress and determine if further intervention/drainage would be required in the office.  Can return sooner for any worsening   Wellness examination -     CBC with Differential/Platelet; Future -     Comprehensive metabolic panel with GFR; Future -     Hemoglobin A1c; Future -     Lipid panel; Future -     TSH Rfx on Abnormal to Free T4; Future  Other orders -     Mupirocin ; Apply to affected area TID for 7 days.  Dispense: 30 g; Refill: 1  Return in about 1 week (around 08/11/2023) for finger.   ___________________________________________ Eugene Gomez de [place], MD, ABFM, [facility] Primary Care and Sports Medicine [facility]

## 2023-08-04 NOTE — Patient Instructions (Signed)
  Medication Instructions:  Your physician recommends that you continue on your current medications as directed. Please refer to the Current Medication list given to you today. --If you need a refill on any your medications before your next appointment, please call your pharmacy first. If no refills are authorized on file call the office.-- Lab Work: Your physician has recommended that you have lab work today: 1 week before physical  If you have labs (blood work) drawn today and your tests are completely normal, you will receive your results via MyChart message OR a phone call from our staff.  Please ensure you check your voicemail in the event that you authorized detailed messages to be left on a delegated number. If you have any lab test that is abnormal or we need to change your treatment, we will call you to review the results.   Follow-Up: Your next appointment:   Your physician recommends that you schedule a follow-up appointment in: 1 week follow up finger / 2 months physical  with Dr. de Peru  You will receive a text message or e-mail with a link to a survey about your care and experience with us  today! We would greatly appreciate your feedback!   Thanks for letting us  be apart of your health journey!!  Primary Care and Sports Medicine   Dr. Quintin sheerer Peru   We encourage you to activate your patient portal called MyChart.  Sign up information is provided on this After Visit Summary.  MyChart is used to connect with patients for Virtual Visits (Telemedicine).  Patients are able to view lab/test results, encounter notes, upcoming appointments, etc.  Non-urgent messages can be sent to your provider as well. To learn more about what you can do with MyChart, please visit --  [email or web address].

## 2023-08-18 NOTE — Assessment & Plan Note (Signed)
 Patient had evaluation with provider at his work and was diagnosed and treated for paronychia.  He reports recurrence of issue at this time. Exam consistent with paronychia, we reviewed considerations.  He does have some tenderness present, no significant swelling/fluctuance currently.  We discussed the potential for abscess formation necessitating drainage.  Given current swelling, no significant fluctuance at this time or discharge appreciated, we can treat initially with warm soaks and application of mupirocin  3 times daily.  We will plan for follow-up in about a week to assess progress and determine if further intervention/drainage would be required in the office.  Can return sooner for any worsening

## 2023-08-18 NOTE — Assessment & Plan Note (Signed)
 Blood pressure elevated in office today.  He has been following with cardiology, however indicates that cardiologist that he has been seeing will no longer be seeing patients and he is requesting to establish with new cardiologist locally.  He continues with chlorthalidone , hydralazine , labetalol , verapamil .  Denies any issues with medications currently. Previously, his cardiologist did discuss potential interventional procedure given resistant hypertension with consideration for renal denervation. In the past, he has not tolerated spironolactone , amlodipine , lisinopril , BiDil  due to various side effects. At this time, can continue with current medication regimen.  Recommend intermittent monitoring of blood pressure at home, DASH diet.  We will also place referral for patient to establish with new cardiologist

## 2023-08-25 ENCOUNTER — Encounter (HOSPITAL_BASED_OUTPATIENT_CLINIC_OR_DEPARTMENT_OTHER): Payer: Self-pay | Admitting: Family Medicine

## 2023-08-25 ENCOUNTER — Encounter (HOSPITAL_BASED_OUTPATIENT_CLINIC_OR_DEPARTMENT_OTHER): Payer: Self-pay | Admitting: *Deleted

## 2023-08-25 ENCOUNTER — Ambulatory Visit (HOSPITAL_BASED_OUTPATIENT_CLINIC_OR_DEPARTMENT_OTHER): Admitting: Family Medicine

## 2023-08-25 VITALS — BP 137/112 | HR 70 | Ht 72.0 in | Wt 235.5 lb

## 2023-08-25 DIAGNOSIS — M25511 Pain in right shoulder: Secondary | ICD-10-CM | POA: Insufficient documentation

## 2023-08-25 DIAGNOSIS — L03012 Cellulitis of left finger: Secondary | ICD-10-CM

## 2023-08-25 DIAGNOSIS — G8929 Other chronic pain: Secondary | ICD-10-CM

## 2023-08-25 NOTE — Patient Instructions (Signed)
  Medication Instructions:  Your physician recommends that you continue on your current medications as directed. Please refer to the Current Medication list given to you today. --If you need a refill on any your medications before your next appointment, please call your pharmacy first. If no refills are authorized on file call the office.--   Referrals/Procedures/Imaging: Physical therapy   Follow-Up: Your next appointment:   Your physician recommends that you schedule a follow-up appointment in: keep appt  with Dr. de Peru  You will receive a text message or e-mail with a link to a survey about your care and experience with us  today! We would greatly appreciate your feedback!   Thanks for letting us  be apart of your health journey!!  Primary Care and Sports Medicine   Dr. Quintin sheerer Peru   We encourage you to activate your patient portal called MyChart.  Sign up information is provided on this After Visit Summary.  MyChart is used to connect with patients for Virtual Visits (Telemedicine).  Patients are able to view lab/test results, encounter notes, upcoming appointments, etc.  Non-urgent messages can be sent to your provider as well. To learn more about what you can do with MyChart, please visit --  [email or web address].

## 2023-08-25 NOTE — Assessment & Plan Note (Signed)
 Chronic, intermittent issue for patient.  More recently, has noted symptoms with exercising/strength training.  He primarily noted symptoms when trying to increase weight for forward flexion as well as abduction of shoulder.  Due to symptoms, he did stop going to the gym as much, however he still continues to have symptoms.  No obvious weakness.  No associated numbness or tingling. On exam, Right shoulder: Obvious swelling, bruising or erythema: absent Deformity of the shoulder: absent Pain with resisted external rotation, no pain with internal rotation. Active ROM: full range of motion Passive ROM: full range of motion Strength: normal/normal Empty can: Positive Hawkins: Negative Neer's: Negative Neurovascular exam: intact  Do not suspect rotator cuff tear, also feel that intra-articular issue is less likely.  Discussed possibility of subacromial impingement, rotator cuff tendinopathy as underlying cause of symptoms.  Given history and exam and likely cause of symptoms, I do feel that patient would benefit from further evaluation and treatment with physical therapy, patient amenable, referral placed today.  Ultimately, if not responding as expected with PT, would likely proceed with x-rays and subsequently MRI.  May return to activities as tolerated and progress as instructed with physical therapy related to right shoulder.  We can follow-up on progress at physical or sooner in the office as needed

## 2023-08-25 NOTE — Assessment & Plan Note (Signed)
 Patient reports the finger is doing much better since last appointment.  Swelling has greatly improved, not currently having any pain.  May have mild pain if he does bump his finger onto something but otherwise symptoms largely resolved. On exam, still with some's mild increased prominence over previously affected side of fingernail.  No current fluctuance, no redness or warmth present.  No tenderness to palpation. Given progress, can continue with monitoring at this time.  If symptoms do recur, recommend return to the office for further evaluation

## 2023-08-25 NOTE — Progress Notes (Signed)
    Procedures performed today:    None.  Independent interpretation of notes and tests performed by another provider:   None.  Brief History, Exam, Impression, and Recommendations:    BP (!) 137/112 (BP Location: Left Arm, Patient Position: Sitting, Cuff Size: Large)   Pulse 70   Ht 6' (1.829 m)   Wt 235 lb 8 oz (106.8 kg)   SpO2 100%   BMI 31.94 kg/m   Chronic right shoulder pain Assessment & Plan: Chronic, intermittent issue for patient.  More recently, has noted symptoms with exercising/strength training.  He primarily noted symptoms when trying to increase weight for forward flexion as well as abduction of shoulder.  Due to symptoms, he did stop going to the gym as much, however he still continues to have symptoms.  No obvious weakness.  No associated numbness or tingling. On exam, Right shoulder: Obvious swelling, bruising or erythema: absent Deformity of the shoulder: absent Pain with resisted external rotation, no pain with internal rotation. Active ROM: full range of motion Passive ROM: full range of motion Strength: normal/normal Empty can: Positive Hawkins: Negative Neer's: Negative Neurovascular exam: intact  Do not suspect rotator cuff tear, also feel that intra-articular issue is less likely.  Discussed possibility of subacromial impingement, rotator cuff tendinopathy as underlying cause of symptoms.  Given history and exam and likely cause of symptoms, I do feel that patient would benefit from further evaluation and treatment with physical therapy, patient amenable, referral placed today.  Ultimately, if not responding as expected with PT, would likely proceed with x-rays and subsequently MRI.  May return to activities as tolerated and progress as instructed with physical therapy related to right shoulder.  We can follow-up on progress at physical or sooner in the office as needed  Orders: -     Ambulatory referral to Physical Therapy  Paronychia of finger of  left hand Assessment & Plan: Patient reports the finger is doing much better since last appointment.  Swelling has greatly improved, not currently having any pain.  May have mild pain if he does bump his finger onto something but otherwise symptoms largely resolved. On exam, still with some's mild increased prominence over previously affected side of fingernail.  No current fluctuance, no redness or warmth present.  No tenderness to palpation. Given progress, can continue with monitoring at this time.  If symptoms do recur, recommend return to the office for further evaluation   Return if symptoms worsen or fail to improve.   ___________________________________________ Elayne Gruver de [place], MD, ABFM, CAQSM Primary Care and Sports Medicine [facility]

## 2023-09-28 ENCOUNTER — Other Ambulatory Visit: Payer: Self-pay | Admitting: Cardiology

## 2023-09-28 DIAGNOSIS — I1A0 Resistant hypertension: Secondary | ICD-10-CM

## 2023-09-29 ENCOUNTER — Other Ambulatory Visit: Payer: Self-pay | Admitting: Cardiology

## 2023-09-29 DIAGNOSIS — I1A0 Resistant hypertension: Secondary | ICD-10-CM

## 2023-10-07 ENCOUNTER — Other Ambulatory Visit: Payer: Self-pay | Admitting: Cardiology

## 2023-10-08 ENCOUNTER — Other Ambulatory Visit: Payer: Self-pay | Admitting: Cardiology

## 2023-10-08 DIAGNOSIS — I10 Essential (primary) hypertension: Secondary | ICD-10-CM

## 2023-10-08 MED ORDER — LABETALOL HCL 300 MG PO TABS: 300.0000 mg | ORAL_TABLET | Freq: Two times a day (BID) | ORAL | 0 refills | Status: DC

## 2023-10-19 ENCOUNTER — Other Ambulatory Visit: Payer: Self-pay | Admitting: Cardiology

## 2023-10-19 DIAGNOSIS — I10 Essential (primary) hypertension: Secondary | ICD-10-CM

## 2023-10-22 ENCOUNTER — Other Ambulatory Visit: Payer: Self-pay | Admitting: Cardiology

## 2023-10-22 DIAGNOSIS — I1A0 Resistant hypertension: Secondary | ICD-10-CM

## 2023-10-23 ENCOUNTER — Other Ambulatory Visit: Payer: Self-pay | Admitting: Cardiology

## 2023-10-23 DIAGNOSIS — I10 Essential (primary) hypertension: Secondary | ICD-10-CM

## 2023-11-03 ENCOUNTER — Other Ambulatory Visit: Payer: Self-pay | Admitting: Cardiology

## 2023-11-08 ENCOUNTER — Other Ambulatory Visit (HOSPITAL_BASED_OUTPATIENT_CLINIC_OR_DEPARTMENT_OTHER): Payer: Self-pay | Admitting: Family Medicine

## 2023-11-08 DIAGNOSIS — J3089 Other allergic rhinitis: Secondary | ICD-10-CM

## 2023-11-25 ENCOUNTER — Other Ambulatory Visit: Payer: Self-pay | Admitting: Cardiology

## 2023-11-25 DIAGNOSIS — I1A0 Resistant hypertension: Secondary | ICD-10-CM

## 2023-12-01 ENCOUNTER — Other Ambulatory Visit (HOSPITAL_BASED_OUTPATIENT_CLINIC_OR_DEPARTMENT_OTHER): Payer: Self-pay | Admitting: Family Medicine

## 2023-12-01 DIAGNOSIS — J3089 Other allergic rhinitis: Secondary | ICD-10-CM

## 2023-12-05 ENCOUNTER — Other Ambulatory Visit: Payer: Self-pay | Admitting: Cardiology

## 2023-12-07 ENCOUNTER — Other Ambulatory Visit (HOSPITAL_BASED_OUTPATIENT_CLINIC_OR_DEPARTMENT_OTHER)

## 2023-12-07 ENCOUNTER — Other Ambulatory Visit: Payer: Self-pay | Admitting: *Deleted

## 2023-12-07 DIAGNOSIS — Z0000 Encounter for general adult medical examination without abnormal findings: Secondary | ICD-10-CM

## 2023-12-08 ENCOUNTER — Ambulatory Visit: Attending: Cardiology | Admitting: Cardiology

## 2023-12-08 ENCOUNTER — Encounter: Payer: Self-pay | Admitting: Cardiology

## 2023-12-08 VITALS — BP 127/80 | HR 64 | Ht 72.0 in | Wt 221.6 lb

## 2023-12-08 DIAGNOSIS — I10 Essential (primary) hypertension: Secondary | ICD-10-CM | POA: Diagnosis not present

## 2023-12-08 DIAGNOSIS — G4733 Obstructive sleep apnea (adult) (pediatric): Secondary | ICD-10-CM

## 2023-12-08 LAB — COMPREHENSIVE METABOLIC PANEL WITH GFR
ALT: 27 IU/L (ref 0–44)
AST: 22 IU/L (ref 0–40)
Albumin: 4.3 g/dL (ref 4.1–5.1)
Alkaline Phosphatase: 88 IU/L (ref 47–123)
BUN/Creatinine Ratio: 7 — ABNORMAL LOW (ref 9–20)
BUN: 10 mg/dL (ref 6–24)
Bilirubin Total: 0.6 mg/dL (ref 0.0–1.2)
CO2: 27 mmol/L (ref 20–29)
Calcium: 10.5 mg/dL — ABNORMAL HIGH (ref 8.7–10.2)
Chloride: 98 mmol/L (ref 96–106)
Creatinine, Ser: 1.34 mg/dL — ABNORMAL HIGH (ref 0.76–1.27)
Globulin, Total: 3 g/dL (ref 1.5–4.5)
Glucose: 72 mg/dL (ref 70–99)
Potassium: 3.4 mmol/L — ABNORMAL LOW (ref 3.5–5.2)
Sodium: 140 mmol/L (ref 134–144)
Total Protein: 7.3 g/dL (ref 6.0–8.5)
eGFR: 68 mL/min/1.73 (ref >59)

## 2023-12-08 LAB — HEMOGLOBIN A1C
Est. average glucose Bld gHb Est-mCnc: 105 mg/dL
Hgb A1c MFr Bld: 5.3 % (ref 4.8–5.6)

## 2023-12-08 LAB — CBC WITH DIFFERENTIAL/PLATELET
Basophils Absolute: 0 x10E3/uL (ref 0.0–0.2)
Basos: 1 %
EOS (ABSOLUTE): 0.1 x10E3/uL (ref 0.0–0.4)
Eos: 1 %
Hematocrit: 54.5 % — ABNORMAL HIGH (ref 37.5–51.0)
Hemoglobin: 18 g/dL — ABNORMAL HIGH (ref 13.0–17.7)
Immature Grans (Abs): 0 x10E3/uL (ref 0.0–0.1)
Immature Granulocytes: 0 %
Lymphocytes Absolute: 2.2 x10E3/uL (ref 0.7–3.1)
Lymphs: 46 %
MCH: 26.7 pg (ref 26.6–33.0)
MCHC: 33 g/dL (ref 31.5–35.7)
MCV: 81 fL (ref 79–97)
Monocytes Absolute: 0.6 x10E3/uL (ref 0.1–0.9)
Monocytes: 12 %
Neutrophils Absolute: 2 x10E3/uL (ref 1.4–7.0)
Neutrophils: 40 %
Platelets: 307 x10E3/uL (ref 150–450)
RBC: 6.73 x10E6/uL — ABNORMAL HIGH (ref 4.14–5.80)
RDW: 15 % (ref 11.6–15.4)
WBC: 4.9 x10E3/uL (ref 3.4–10.8)

## 2023-12-08 LAB — LIPID PANEL
Chol/HDL Ratio: 2.9 ratio (ref 0.0–5.0)
Cholesterol, Total: 137 mg/dL (ref 100–199)
HDL: 48 mg/dL (ref >39)
LDL Chol Calc (NIH): 77 mg/dL (ref 0–99)
Triglycerides: 55 mg/dL (ref 0–149)
VLDL Cholesterol Cal: 12 mg/dL (ref 5–40)

## 2023-12-08 LAB — TSH RFX ON ABNORMAL TO FREE T4: TSH: 0.991 u[IU]/mL (ref 0.450–4.500)

## 2023-12-08 MED ORDER — CHLORTHALIDONE 25 MG PO TABS: 25.0000 mg | ORAL_TABLET | Freq: Every day | ORAL | 1 refills | Status: AC

## 2023-12-08 MED ORDER — VERAPAMIL HCL ER 360 MG PO CP24: 360.0000 mg | ORAL_CAPSULE | Freq: Every day | ORAL | 1 refills | Status: AC

## 2023-12-08 MED ORDER — HYDRALAZINE HCL 50 MG PO TABS
25.0000 mg | ORAL_TABLET | Freq: Two times a day (BID) | ORAL | Status: AC
Start: 2023-12-08 — End: ?

## 2023-12-08 NOTE — Patient Instructions (Signed)
 Medication Instructions:  STOP Labetalol   DECREASE hydralazine  to half tablet twice daily   *If you need a refill on your cardiac medications before your next appointment, please call your pharmacy*  Follow-Up: At [facility], you and your health needs are our priority.  As part of our continuing mission to provide you with exceptional heart care, our providers are all part of one team.  This team includes your primary Cardiologist (physician) and Advanced Practice Providers or APPs (Physician Assistants and Nurse Practitioners) who all work together to provide you with the care you need, when you need it.  Your next appointment:   As needed   Provider:   Newman JINNY Lawrence, MD

## 2023-12-08 NOTE — Progress Notes (Signed)
 Cardiology Office Note:  .   Date:  12/08/2023  ID:  Viona Bras, DOB 1980/12/25, MRN [id number] PCP: de [place], Raymond J, MD  [facility] Providers Cardiologist:  Newman Lawrence, MD PCP: de [place], Quintin PARAS, MD  Chief Complaint  Patient presents with   Hypertension     Press Casale is a 43 y.o. male with resistant hypertension, OSA   Discussed the use of AI scribe software for clinical note transcription with the patient, who gave verbal consent to proceed.  History of Present Illness  Patient is doing well.  He is at significant improvement in his blood pressure after being on Wegovy, along with exercise, leading to weight loss.  He was on CPAP for OSA, but was recalled.     There were no vitals filed for this visit.    Review of Systems  Cardiovascular:  Negative for chest pain, dyspnea on exertion, leg swelling, palpitations and syncope.        Studies Reviewed: SABRA        EKG 12/08/2023: Normal sinus rhythm Minimal voltage criteria for LVH, may be normal variant ( Sokolow-Lyon ) Nonspecific ST abnormality When compared with ECG of 06-Jul-2020 13:50, No significant change was found  Labs 11/2023: Chol 137, TG 55, HDL 48, LDL 77 HbA1C 5.3% Hb 18 Cr 1.34 TSH 0.9   Physical Exam Vitals and nursing note reviewed.  Constitutional:      General: He is not in acute distress. Neck:     Vascular: No JVD.  Cardiovascular:     Rate and Rhythm: Normal rate and regular rhythm.     Heart sounds: Normal heart sounds. No murmur heard. Pulmonary:     Effort: Pulmonary effort is normal.     Breath sounds: Normal breath sounds. No wheezing or rales.  Musculoskeletal:     Right lower leg: No edema.     Left lower leg: No edema.      VISIT DIAGNOSES:   ICD-10-CM   1. Primary hypertension  I10 EKG 12-Lead    chlorthalidone  (HYGROTON ) 25 MG tablet    verapamil  (VERELAN ) 360 MG 24 hr capsule    2. OSA (obstructive sleep apnea)  G47.33         Leonardo Makris is a 43 y.o. male with resistant hypertension, OSA.  Assessment & Plan  Hypertension: Secondary workup has been negative in the past. He has not tolerated amlodipine  10 mg due to leg edema, and lisinopril  due to cough, spironolactone  due to breast tenderness, Bidil  due to nausea. Blood pressure now much better controlled. Currently using hydralazine  50 mg twice daily, okay to reduce to half tablet of 50 mg twice daily, and subsequently stop if blood pressure continues to improve. Refill chlorthalidone  and verapamil  today, can be refilled by PCP in the future.    OSA: Noted on sleep study in 2020, but has not been on CPAP consistently.  With recent weight loss, it is possible that OSA could have improved.  Defer management to PCP.   Meds ordered this encounter  Medications   hydrALAZINE  (APRESOLINE ) 50 MG tablet    Sig: Take 0.5 tablets (25 mg total) by mouth in the morning and at bedtime.    Overdue for a follow up appointment please schedule an appointment for additional refills *2nd attempt   chlorthalidone  (HYGROTON ) 25 MG tablet    Sig: Take 1 tablet (25 mg total) by mouth daily.    Dispense:  90 tablet    Refill:  1  verapamil  (VERELAN ) 360 MG 24 hr capsule    Sig: Take 1 capsule (360 mg total) by mouth at bedtime.    Dispense:  90 capsule    Refill:  1     F/u as needed  Signed, Newman JINNY Lawrence, MD

## 2023-12-11 ENCOUNTER — Ambulatory Visit (HOSPITAL_BASED_OUTPATIENT_CLINIC_OR_DEPARTMENT_OTHER): Payer: Self-pay | Admitting: Family Medicine

## 2023-12-14 ENCOUNTER — Encounter (HOSPITAL_BASED_OUTPATIENT_CLINIC_OR_DEPARTMENT_OTHER): Payer: Self-pay | Admitting: Family Medicine

## 2023-12-14 ENCOUNTER — Ambulatory Visit (INDEPENDENT_AMBULATORY_CARE_PROVIDER_SITE_OTHER): Admitting: Family Medicine

## 2023-12-14 VITALS — BP 169/110 | HR 99 | Temp 98.6°F | Resp 18 | Ht 72.0 in | Wt 221.0 lb

## 2023-12-14 DIAGNOSIS — Z0000 Encounter for general adult medical examination without abnormal findings: Secondary | ICD-10-CM | POA: Diagnosis not present

## 2023-12-14 DIAGNOSIS — Z23 Encounter for immunization: Secondary | ICD-10-CM | POA: Diagnosis not present

## 2023-12-14 MED ORDER — HYDROCORTISONE (PERIANAL) 2.5 % EX CREA: 1.0000 | TOPICAL_CREAM | Freq: Two times a day (BID) | CUTANEOUS | 3 refills | Status: AC

## 2023-12-14 NOTE — Assessment & Plan Note (Signed)

## 2023-12-14 NOTE — Progress Notes (Signed)
 Subjective:    CC: Annual Physical Exam  HPI: Eugene Gomez is a 43 y.o. presenting for annual physical  I reviewed the past medical history, family history, social history, surgical history, and allergies today and no changes were needed.  Please see the problem list section below in epic for further details.  Past Medical History: Past Medical History:  Diagnosis Date   Hypertension    Past Surgical History: Past Surgical History:  Procedure Laterality Date   DENTAL SURGERY Left    WISDOM TOOTH EXTRACTION     Social History: Social History   Socioeconomic History   Marital status: Married    Spouse name: Therapist, Occupational   Number of children: 2   Years of education: Associates   Highest education level: Bachelor's degree (e.g., BA, AB, BS)  Occupational History   Occupation: Personnel Officer: [employer]    Comment: (trash truck driver)   Occupation: Night Job    Comment: [employer]  Tobacco Use   Smoking status: Former    Current packs/day: 0.00    Average packs/day: 2.0 packs/day for 10.0 years (20.0 ttl pk-yrs)    Types: Cigars, Cigarettes    Start date: 2008    Quit date: 2018    Years since quitting: 7.8   Smokeless tobacco: Never   Tobacco comments:    smoked black and milds 1 a day   Vaping Use   Vaping status: Never Used  Substance and Sexual Activity   Alcohol use: Yes    Alcohol/week: 1.0 - 10.0 standard drink of alcohol    Types: 1 - 10 Standard drinks or equivalent per week    Comment: MIXED DRINKS   Drug use: No   Sexual activity: Yes    Partners: Female  Other Topics Concern   Not on file  Social History Narrative   Lives with his wife and two daughters.   Social Drivers of Corporate Investment Banker Strain: Low Risk  (10/06/2022)   Overall Financial Resource Strain (CARDIA)    Difficulty of Paying Living Expenses: Not very hard  Food Insecurity: No Food Insecurity (10/06/2022)   Hunger Vital Sign    Worried About  Running Out of Food in the Last Year: Never true    Ran Out of Food in the Last Year: Never true  Transportation Needs: No Transportation Needs (10/06/2022)   PRAPARE - Administrator, Civil Service (Medical): No    Lack of Transportation (Non-Medical): No  Physical Activity: Sufficiently Active (10/06/2022)   Exercise Vital Sign    Days of Exercise per Week: 4 days    Minutes of Exercise per Session: 60 min  Stress: No Stress Concern Present (10/06/2022)   [employer] of Occupational Health - Occupational Stress Questionnaire    Feeling of Stress : Not at all  Social Connections: Moderately Isolated (10/06/2022)   Social Connection and Isolation Panel    Frequency of Communication with Friends and Family: Once a week    Frequency of Social Gatherings with Friends and Family: Never    Attends Religious Services: 1 to 4 times per year    Active Member of [employer] or Organizations: No    Attends Engineer, Structural: Not on file    Marital Status: Married   Family History: Family History  Problem Relation Age of Onset   Hypertension Mother    Anemia Mother    Hypertension Father    Diabetes Father  Hypertension Brother    Drug abuse Brother    Cancer Sister        brain   Hypertension Maternal Aunt    Hypertension Maternal Uncle    Hypertension Paternal Aunt    Glaucoma Paternal Aunt    Diabetes Paternal Aunt    Hypertension Paternal Uncle    Diabetes Paternal Uncle    Allergies: Allergies  Allergen Reactions   Aspirin Hives   Olive Oil Rash   Medications: See med rec.  Review of Systems: No headache, visual changes, nausea, vomiting, diarrhea, constipation, dizziness, abdominal pain, skin rash, fevers, chills, night sweats, swollen lymph nodes, weight loss, chest pain, body aches, joint swelling, muscle aches, shortness of breath, mood changes, visual or auditory hallucinations.  Objective:    BP (!) 169/110 (BP Location: Right Arm, Patient  Position: Sitting, Cuff Size: Large)   Pulse 99   Temp 98.6 F (37 C) (Oral)   Resp 18   Ht 6' (1.829 m)   Wt 221 lb (100.2 kg)   SpO2 97%   BMI 29.97 kg/m   General: Well Developed, well nourished, and in no acute distress.  Neuro: Alert and oriented x3, extra-ocular muscles intact, sensation grossly intact. Cranial nerves II through XII are intact, motor, sensory, and coordinative functions are all intact. HEENT: Normocephalic, atraumatic, pupils equal round reactive to light, neck supple, no masses, no lymphadenopathy, thyroid  nonpalpable. Oropharynx, nasopharynx, external ear canals are unremarkable. Skin: Warm and dry, no rashes noted.  Cardiac: Regular rate and rhythm, no murmurs rubs or gallops.  Respiratory: Clear to auscultation bilaterally. Not using accessory muscles, speaking in full sentences.  Abdominal: Soft, nontender, nondistended, positive bowel sounds, no masses, no organomegaly.  Musculoskeletal: Shoulder, elbow, wrist, hip, knee, ankle stable, and with full range of motion.  Impression and Recommendations:    Wellness examination Assessment & Plan: Routine HCM labs reviewed. HCM reviewed/discussed. Anticipatory guidance regarding healthy weight, lifestyle and choices given. Recommend healthy diet.  Recommend approximately 150 minutes/week of moderate intensity exercise Recommend regular dental and vision exams Always use seatbelt/lap and shoulder restraints Recommend using smoke alarms and checking batteries at least twice a year Recommend using sunscreen when outside Discussed immunization recommendations   Immunization due -     Flu vaccine trivalent PF, 6mos and older(Flulaval,Afluria,Fluarix,Fluzone)  Other orders -     Hydrocortisone  (Perianal); Place 1 Application rectally 2 (two) times daily.  Dispense: 30 g; Refill: 3  Return in about 4 months (around 04/12/2024) for hypertension.   ___________________________________________ Damonie Furney de [place], MD,  ABFM, CAQSM Primary Care and Sports Medicine [facility]

## 2023-12-27 ENCOUNTER — Telehealth (HOSPITAL_BASED_OUTPATIENT_CLINIC_OR_DEPARTMENT_OTHER): Payer: Self-pay | Admitting: Family Medicine

## 2023-12-27 NOTE — Telephone Encounter (Signed)
 Copied from CRM [id number]. Topic: General - Other >> Dec 25, 2023  4:30 PM Fonda T wrote: Reason for CRM: Pt calling, states he was informed by office that a wellness physical form was faxed to his employer.  Pt reports per employer, has not received form from office.  Pt states form for physical was given to office for completion on same day as appt on 12/14/23.  Requesting form be faxed to employer as soon as possible.  Employer: [employer]x: [phone]  Pt requesting a follow up call once form is faxed, can be reached back at [phone].

## 2023-12-28 NOTE — Telephone Encounter (Signed)
 Form has been refaxed to pt's employer and confirmation was received that the fax did go through. Mychart message sent to pt making him aware.

## 2024-01-05 ENCOUNTER — Telehealth (HOSPITAL_BASED_OUTPATIENT_CLINIC_OR_DEPARTMENT_OTHER): Payer: Self-pay | Admitting: *Deleted

## 2024-01-05 NOTE — Telephone Encounter (Signed)
 Copied from CRM #[id number]. Topic: General - Call Back - No Documentation >> Jan 05, 2024  1:50 PM Olam RAMAN wrote: Reason for CRM: Pt is calling to check if form for his job, to get credit, can be corrected. The year shows 2023 (dated) and needs to show 2025 for his job.  Fax is on paperwork

## 2024-01-05 NOTE — Telephone Encounter (Signed)
 Form has been printed and date/year has been changed. Form refaxed to pt's employer and confirmation was received that the fax went through.

## 2024-01-26 ENCOUNTER — Other Ambulatory Visit: Payer: Self-pay | Admitting: Cardiology

## 2024-02-06 ENCOUNTER — Other Ambulatory Visit (HOSPITAL_BASED_OUTPATIENT_CLINIC_OR_DEPARTMENT_OTHER): Payer: Self-pay | Admitting: Family Medicine

## 2024-02-06 DIAGNOSIS — R12 Heartburn: Secondary | ICD-10-CM

## 2024-02-12 ENCOUNTER — Encounter (HOSPITAL_BASED_OUTPATIENT_CLINIC_OR_DEPARTMENT_OTHER): Payer: Self-pay | Admitting: Family Medicine

## 2024-04-12 ENCOUNTER — Ambulatory Visit (HOSPITAL_BASED_OUTPATIENT_CLINIC_OR_DEPARTMENT_OTHER)
